# Patient Record
Sex: Male | Born: 1975 | Race: Black or African American | Hispanic: No | Marital: Single | Smoking: Never smoker
Health system: Southern US, Community
[De-identification: ages and names within clinical notes are randomized; demographics above are authoritative.]

## PROBLEM LIST (undated history)

## (undated) DIAGNOSIS — B20 Human immunodeficiency virus [HIV] disease: Principal | ICD-10-CM

## (undated) DIAGNOSIS — K209 Esophagitis, unspecified: Secondary | ICD-10-CM

## (undated) DIAGNOSIS — Z113 Encounter for screening for infections with a predominantly sexual mode of transmission: Secondary | ICD-10-CM

## (undated) DIAGNOSIS — R635 Abnormal weight gain: Secondary | ICD-10-CM

## (undated) DIAGNOSIS — J302 Other seasonal allergic rhinitis: Secondary | ICD-10-CM

## (undated) DIAGNOSIS — R739 Hyperglycemia, unspecified: Secondary | ICD-10-CM

## (undated) DIAGNOSIS — I10 Essential (primary) hypertension: Secondary | ICD-10-CM

## (undated) HISTORY — DX: Other seasonal allergic rhinitis: J30.2

## (undated) HISTORY — DX: Hyperglycemia, unspecified: R73.9

## (undated) HISTORY — DX: Human immunodeficiency virus (HIV) disease: B20

## (undated) HISTORY — DX: Essential (primary) hypertension: I10

## (undated) HISTORY — DX: Esophagitis, unspecified: K20.9

## (undated) HISTORY — DX: Abnormal weight gain: R63.5

## (undated) HISTORY — DX: Encounter for screening for infections with a predominantly sexual mode of transmission: Z11.3

---

## 1998-06-14 ENCOUNTER — Emergency Department (HOSPITAL_COMMUNITY): Admission: EM | Admit: 1998-06-14 | Discharge: 1998-06-14 | Payer: Self-pay | Admitting: Emergency Medicine

## 1998-07-05 ENCOUNTER — Encounter: Admission: RE | Admit: 1998-07-05 | Discharge: 1998-07-05 | Payer: Self-pay | Admitting: *Deleted

## 1999-09-15 ENCOUNTER — Emergency Department (HOSPITAL_COMMUNITY): Admission: EM | Admit: 1999-09-15 | Discharge: 1999-09-16 | Payer: Self-pay | Admitting: Emergency Medicine

## 1999-09-16 ENCOUNTER — Encounter: Payer: Self-pay | Admitting: Emergency Medicine

## 1999-12-20 ENCOUNTER — Encounter: Payer: Self-pay | Admitting: Emergency Medicine

## 1999-12-20 ENCOUNTER — Emergency Department (HOSPITAL_COMMUNITY): Admission: EM | Admit: 1999-12-20 | Discharge: 1999-12-20 | Payer: Self-pay | Admitting: Emergency Medicine

## 2000-06-05 ENCOUNTER — Emergency Department (HOSPITAL_COMMUNITY): Admission: EM | Admit: 2000-06-05 | Discharge: 2000-06-05 | Payer: Self-pay | Admitting: Emergency Medicine

## 2000-06-05 ENCOUNTER — Encounter: Payer: Self-pay | Admitting: Emergency Medicine

## 2002-05-17 ENCOUNTER — Emergency Department (HOSPITAL_COMMUNITY): Admission: EM | Admit: 2002-05-17 | Discharge: 2002-05-17 | Payer: Self-pay | Admitting: Emergency Medicine

## 2003-05-11 ENCOUNTER — Emergency Department (HOSPITAL_COMMUNITY): Admission: EM | Admit: 2003-05-11 | Discharge: 2003-05-11 | Payer: Self-pay | Admitting: Emergency Medicine

## 2003-06-13 ENCOUNTER — Encounter: Payer: Self-pay | Admitting: Emergency Medicine

## 2003-06-13 ENCOUNTER — Emergency Department (HOSPITAL_COMMUNITY): Admission: EM | Admit: 2003-06-13 | Discharge: 2003-06-13 | Payer: Self-pay | Admitting: Emergency Medicine

## 2004-03-06 ENCOUNTER — Emergency Department (HOSPITAL_COMMUNITY): Admission: EM | Admit: 2004-03-06 | Discharge: 2004-03-06 | Payer: Self-pay | Admitting: Emergency Medicine

## 2006-12-31 ENCOUNTER — Emergency Department (HOSPITAL_COMMUNITY): Admission: EM | Admit: 2006-12-31 | Discharge: 2006-12-31 | Payer: Self-pay | Admitting: Emergency Medicine

## 2007-03-23 ENCOUNTER — Emergency Department (HOSPITAL_COMMUNITY): Admission: EM | Admit: 2007-03-23 | Discharge: 2007-03-23 | Payer: Self-pay | Admitting: Emergency Medicine

## 2007-07-28 ENCOUNTER — Emergency Department (HOSPITAL_COMMUNITY): Admission: EM | Admit: 2007-07-28 | Discharge: 2007-07-28 | Payer: Self-pay | Admitting: Emergency Medicine

## 2008-03-15 ENCOUNTER — Emergency Department (HOSPITAL_COMMUNITY): Admission: EM | Admit: 2008-03-15 | Discharge: 2008-03-15 | Payer: Self-pay | Admitting: Emergency Medicine

## 2008-08-17 ENCOUNTER — Emergency Department (HOSPITAL_COMMUNITY): Admission: EM | Admit: 2008-08-17 | Discharge: 2008-08-17 | Payer: Self-pay | Admitting: Emergency Medicine

## 2010-01-12 ENCOUNTER — Observation Stay (HOSPITAL_COMMUNITY): Admission: EM | Admit: 2010-01-12 | Discharge: 2010-01-13 | Payer: Self-pay | Admitting: Emergency Medicine

## 2011-02-14 LAB — LIPID PANEL
Cholesterol: 185 mg/dL (ref 0–200)
HDL: 41 mg/dL (ref >39)
LDL Cholesterol: 128 mg/dL — ABNORMAL HIGH (ref 0–99)
VLDL: 16 mg/dL (ref 0–40)

## 2011-02-14 LAB — COMPREHENSIVE METABOLIC PANEL
ALT: 22 U/L (ref 0–53)
AST: 25 U/L (ref 0–37)
BUN: 8 mg/dL (ref 6–23)
Calcium: 9 mg/dL (ref 8.4–10.5)
GFR calc Af Amer: >60 mL/min (ref >60)
Sodium: 138 mEq/L (ref 135–145)

## 2011-02-14 LAB — CBC
HCT: 45.8 % (ref 39.0–52.0)
MCV: 92.7 fL (ref 78.0–100.0)
RBC: 4.94 MIL/uL (ref 4.22–5.81)

## 2011-02-14 LAB — DIFFERENTIAL
Basophils Absolute: 0 10*3/uL (ref 0.0–0.1)
Basophils Relative: 1 % (ref 0–1)
Eosinophils Absolute: 0.1 10*3/uL (ref 0.0–0.7)
Eosinophils Relative: 1 % (ref 0–5)
Lymphs Abs: 2.5 10*3/uL (ref 0.7–4.0)
Monocytes Absolute: 0.4 10*3/uL (ref 0.1–1.0)
Monocytes Relative: 8 % (ref 3–12)
Neutro Abs: 2.5 10*3/uL (ref 1.7–7.7)
Neutrophils Relative %: 46 % (ref 43–77)

## 2011-02-14 LAB — CK TOTAL AND CKMB (NOT AT ARMC)
CK, MB: 3 ng/mL (ref 0.3–4.0)
CK, MB: 3.2 ng/mL (ref 0.3–4.0)
Total CK: 382 U/L — ABNORMAL HIGH (ref 7–232)
Total CK: 408 U/L — ABNORMAL HIGH (ref 7–232)

## 2011-02-14 LAB — TROPONIN I
Troponin I: 0.01 ng/mL (ref 0.00–0.06)
Troponin I: <0.01 ng/mL (ref 0.00–0.06)

## 2011-08-19 LAB — POCT I-STAT, CHEM 8
BUN: 9
Creatinine, Ser: 1.3
Glucose, Bld: 75
HCT: 50
Hemoglobin: 17

## 2011-08-19 LAB — RAPID URINE DRUG SCREEN, HOSP PERFORMED
Barbiturates: NOT DETECTED
Benzodiazepines: NOT DETECTED

## 2011-08-19 LAB — ETHANOL: Alcohol, Ethyl (B): <5

## 2011-08-25 LAB — BASIC METABOLIC PANEL
CO2: 30
GFR calc Af Amer: >60
Glucose, Bld: 90

## 2011-08-25 LAB — DIFFERENTIAL
Basophils Absolute: 0
Basophils Relative: 0
Eosinophils Absolute: 0.4
Eosinophils Relative: 8 — ABNORMAL HIGH
Lymphocytes Relative: 36
Monocytes Absolute: 0.6
Monocytes Relative: 14 — ABNORMAL HIGH
Neutro Abs: 1.8
Neutrophils Relative %: 42 — ABNORMAL LOW

## 2011-08-25 LAB — CBC
HCT: 45.7
MCHC: 34
RBC: 4.91
WBC: 4.4

## 2011-08-25 LAB — URINALYSIS, ROUTINE W REFLEX MICROSCOPIC
Glucose, UA: NEGATIVE
Hgb urine dipstick: NEGATIVE
Ketones, ur: NEGATIVE
Protein, ur: NEGATIVE
Urobilinogen, UA: 1
pH: 7

## 2011-08-25 LAB — PHENYTOIN LEVEL, TOTAL: Phenytoin Lvl: <2.5 — ABNORMAL LOW

## 2015-05-03 ENCOUNTER — Encounter (HOSPITAL_COMMUNITY): Payer: Self-pay | Admitting: *Deleted

## 2015-05-03 ENCOUNTER — Emergency Department (HOSPITAL_COMMUNITY)
Admission: EM | Admit: 2015-05-03 | Discharge: 2015-05-03 | Disposition: A | Discharge status: Home / Self Care | Payer: Self-pay | Attending: Emergency Medicine | Admitting: Emergency Medicine

## 2015-05-03 DIAGNOSIS — L509 Urticaria, unspecified: Secondary | ICD-10-CM | POA: Insufficient documentation

## 2015-05-03 DIAGNOSIS — Z79899 Other long term (current) drug therapy: Secondary | ICD-10-CM | POA: Insufficient documentation

## 2015-05-03 MED ORDER — FAMOTIDINE 20 MG PO TABS: 20.0000 mg | ORAL_TABLET | Freq: Two times a day (BID) | ORAL | Status: DC

## 2015-05-03 MED ORDER — FAMOTIDINE IN NACL 20-0.9 MG/50ML-% IV SOLN
20.0000 mg | Freq: Once | INTRAVENOUS | Status: AC
  Administered 2015-05-03: 20 mg via INTRAVENOUS
  Filled 2015-05-03: qty 50

## 2015-05-03 MED ORDER — METHYLPREDNISOLONE SODIUM SUCC 125 MG IJ SOLR
125.0000 mg | Freq: Once | INTRAMUSCULAR | Status: AC
  Administered 2015-05-03: 125 mg via INTRAVENOUS
  Filled 2015-05-03: qty 2

## 2015-05-03 MED ORDER — DIPHENHYDRAMINE HCL 25 MG PO TABS: 25.0000 mg | ORAL_TABLET | Freq: Four times a day (QID) | ORAL | Status: DC

## 2015-05-03 MED ORDER — PREDNISONE 10 MG PO TABS: ORAL_TABLET | ORAL | Status: DC

## 2015-05-03 MED ORDER — DIPHENHYDRAMINE HCL 50 MG/ML IJ SOLN
12.5000 mg | Freq: Once | INTRAMUSCULAR | Status: AC
  Administered 2015-05-03: 12.5 mg via INTRAVENOUS
  Filled 2015-05-03: qty 1

## 2015-05-03 NOTE — ED Notes (Signed)
Pt ate shrimp on Tuesday, for which he has had nausea and vomiting from before.  There has been recent changes in washing detergent.

## 2015-05-03 NOTE — Discharge Instructions (Signed)
Hives Hives are itchy, red, swollen areas of the skin. They can vary in size and location on your body. Hives can come and go for hours or several days (acute hives) or for several weeks (chronic hives). Hives do not spread from person to person (noncontagious). They may get worse with scratching, exercise, and emotional stress. CAUSES   Allergic reaction to food, additives, or drugs.  Infections, including the common cold.  Illness, such as vasculitis, lupus, or thyroid disease.  Exposure to sunlight, heat, or cold.  Exercise.  Stress.  Contact with chemicals. SYMPTOMS   Red or white swollen patches on the skin. The patches may change size, shape, and location quickly and repeatedly.  Itching.  Swelling of the hands, feet, and face. This may occur if hives develop deeper in the skin. DIAGNOSIS  Your caregiver can usually tell what is wrong by performing a physical exam. Skin or blood tests may also be done to determine the cause of your hives. In some cases, the cause cannot be determined. TREATMENT  Mild cases usually get better with medicines such as antihistamines. Severe cases may require an emergency epinephrine injection. If the cause of your hives is known, treatment includes avoiding that trigger.  HOME CARE INSTRUCTIONS   Avoid causes that trigger your hives.  Take antihistamines as directed by your caregiver to reduce the severity of your hives. Non-sedating or low-sedating antihistamines are usually recommended. Do not drive while taking an antihistamine.  Take any other medicines prescribed for itching as directed by your caregiver.  Wear loose-fitting clothing.  Keep all follow-up appointments as directed by your caregiver. SEEK MEDICAL CARE IF:   You have persistent or severe itching that is not relieved with medicine.  You have painful or swollen joints. SEEK IMMEDIATE MEDICAL CARE IF:   You have a fever.  Your tongue or lips are swollen.  You have  trouble breathing or swallowing.  You feel tightness in the throat or chest.  You have abdominal pain. These problems may be the first sign of a life-threatening allergic reaction. Call your local emergency services (911 in [place] MAKE SURE YOU:   Understand these instructions.  Will watch your condition.  Will get help right away if you are not doing well or get worse. Document Released: 11/10/2005 Document Revised: 11/15/2013 Document Reviewed: 02/03/2012 ExitCare Patient Information 2015 [employer]. This information is not intended to replace advice given to you by your health care provider. Make sure you discuss any questions you have with your health care provider.  

## 2015-05-03 NOTE — ED Provider Notes (Signed)
CSN: [id number]     Arrival date & time 05/03/15  0114 History   First MD Initiated Contact with Patient 05/03/15 0146     Chief Complaint  Patient presents with  . Rash     (Consider location/radiation/quality/duration/timing/severity/associated sxs/prior Treatment) Patient is a 39 y.o. male presenting with rash. The history is provided by the patient. No language interpreter was used.  Rash Location:  Full body Quality: itchiness and redness   Onset quality:  Gradual Associated symptoms: no abdominal pain, no fever and no shortness of breath   Associated symptoms comment:  Generalized rash since yesterday afternoon that itches. He ate shrimp 48 hours ago and reports similar reaction after eating shrimp in the past. No SOB, tongue or lip swelling and no difficulty swallowing.    History reviewed. No pertinent past medical history. History reviewed. No pertinent past surgical history. No family history on file. History  Substance Use Topics  . Smoking status: Never Smoker   . Smokeless tobacco: Not on file  . Alcohol Use: Yes     Comment: occ    Review of Systems  Constitutional: Negative for fever and chills.  HENT: Negative.  Negative for trouble swallowing.   Respiratory: Negative.  Negative for shortness of breath.   Cardiovascular: Negative.   Gastrointestinal: Negative.  Negative for abdominal pain.  Musculoskeletal: Negative.   Skin: Positive for rash.       See HPI.  Neurological: Negative.       Allergies  Shrimp  Home Medications   Prior to Admission medications   Medication Sig Start Date End Date Taking? Authorizing Provider  BLACK CURRANT SEED OIL PO Take 1 drop by mouth daily.   Yes Historical Provider, MD  cholecalciferol (VITAMIN D) 1000 UNITS tablet Take 1,000 Units by mouth daily.   Yes Historical Provider, MD  diphenhydrAMINE (BENADRYL) 12.5 MG/5ML elixir Take 37.5 mg by mouth 4 (four) times daily as needed for allergies.   Yes Historical  Provider, MD  Flaxseed, Linseed, (FLAXSEED OIL) 1000 MG CAPS Take 1 capsule by mouth daily.   Yes Historical Provider, MD  hydrocortisone cream 1 % Apply 1 application topically 2 (two) times daily as needed for itching.   Yes Historical Provider, MD  ibuprofen (ADVIL,MOTRIN) 200 MG tablet Take 400 mg by mouth every 6 (six) hours as needed for moderate pain.   Yes Historical Provider, MD  Multiple Vitamin (MULTIVITAMIN WITH MINERALS) TABS tablet Take 1 tablet by mouth daily.   Yes Historical Provider, MD  Oil of Oregano 1500 MG CAPS Take 1 capsule by mouth daily.   Yes Historical Provider, MD  omega-3 acid ethyl esters (LOVAZA) 1 G capsule Take 1 g by mouth 2 (two) times daily.   Yes Historical Provider, MD  OVER THE COUNTER MEDICATION Take 2 tablets by mouth daily. Schisandra (supplement)   Yes Historical Provider, MD  OVER THE COUNTER MEDICATION Take 1 tablet by mouth daily. Tumeric   Yes Historical Provider, MD  vitamin E 1000 UNIT capsule Take 1,000 Units by mouth daily.   Yes Historical Provider, MD   BP 134/110 mmHg  Pulse 79  Temp(Src) 99.7 F (37.6 C) (Rectal)  Resp 20  SpO2 100% Physical Exam  Constitutional: He is oriented to person, place, and time. He appears well-developed and well-nourished.  HENT:  Head: Normocephalic.  Mouth/Throat: Oropharynx is clear and moist.  Neck: Normal range of motion. Neck supple.  Cardiovascular: Normal rate and regular rhythm.   Pulmonary/Chest: Effort normal and breath  sounds normal. He has no wheezes. He has no rales.  Abdominal: Soft. Bowel sounds are normal. There is no tenderness. There is no rebound and no guarding.  Musculoskeletal: Normal range of motion.  Neurological: He is alert and oriented to person, place, and time.  Skin: Skin is warm and dry. Rash noted.  Red, raised generalized rash c/w hives.   Psychiatric: He has a normal mood and affect.    ED Course  Procedures (including critical care time) Labs Review Labs Reviewed  - No data to display  Imaging Review No results found.   EKG Interpretation None      MDM   Final diagnoses:  None    1. Hives  Symptoms improved with medications in ED. Continues to deny SOB, difficulty swelling. Feel he is stable for discharge home.     Elpidio Anis, PA-C 05/03/15 0444  Elwin Mocha, MD 05/03/15 [phone]

## 2015-05-03 NOTE — ED Notes (Signed)
Pt states that he broke out in a rash this evening around 5pm; pt c/o itching and feel hot and cold; pt with red raised rash; pt denies difficulty breathing

## 2015-10-04 ENCOUNTER — Emergency Department (HOSPITAL_COMMUNITY): Payer: Self-pay

## 2015-10-04 ENCOUNTER — Encounter (HOSPITAL_COMMUNITY): Payer: Self-pay | Admitting: Family Medicine

## 2015-10-04 ENCOUNTER — Telehealth: Payer: Self-pay | Admitting: Infectious Disease

## 2015-10-04 ENCOUNTER — Emergency Department (HOSPITAL_COMMUNITY)
Admission: EM | Admit: 2015-10-04 | Discharge: 2015-10-04 | Disposition: A | Discharge status: Home / Self Care | Payer: Self-pay | Attending: Emergency Medicine | Admitting: Emergency Medicine

## 2015-10-04 DIAGNOSIS — F419 Anxiety disorder, unspecified: Secondary | ICD-10-CM | POA: Insufficient documentation

## 2015-10-04 DIAGNOSIS — Z79899 Other long term (current) drug therapy: Secondary | ICD-10-CM | POA: Insufficient documentation

## 2015-10-04 DIAGNOSIS — Z87891 Personal history of nicotine dependence: Secondary | ICD-10-CM | POA: Insufficient documentation

## 2015-10-04 DIAGNOSIS — B20 Human immunodeficiency virus [HIV] disease: Secondary | ICD-10-CM | POA: Insufficient documentation

## 2015-10-04 DIAGNOSIS — Z21 Asymptomatic human immunodeficiency virus [HIV] infection status: Secondary | ICD-10-CM

## 2015-10-04 DIAGNOSIS — K219 Gastro-esophageal reflux disease without esophagitis: Secondary | ICD-10-CM | POA: Insufficient documentation

## 2015-10-04 DIAGNOSIS — IMO0001 Reserved for inherently not codable concepts without codable children: Secondary | ICD-10-CM

## 2015-10-04 LAB — DIFFERENTIAL
Basophils Absolute: 0 10*3/uL (ref 0.0–0.1)
Basophils Relative: 1 %
EOS ABS: 0 10*3/uL (ref 0.0–0.7)
EOS PCT: 1 %
Lymphocytes Relative: 26 %
Lymphs Abs: 0.5 10*3/uL — ABNORMAL LOW (ref 0.7–4.0)
Monocytes Absolute: 0.3 10*3/uL (ref 0.1–1.0)
Monocytes Relative: 15 %
Neutro Abs: 1.2 10*3/uL — ABNORMAL LOW (ref 1.7–7.7)
Neutrophils Relative %: 57 %

## 2015-10-04 LAB — I-STAT TROPONIN, ED: TROPONIN I, POC: 0 ng/mL (ref 0.00–0.08)

## 2015-10-04 LAB — CBC
HCT: 38.5 % — ABNORMAL LOW (ref 39.0–52.0)
HEMOGLOBIN: 13.2 g/dL (ref 13.0–17.0)
MCH: 31.4 pg (ref 26.0–34.0)
MCHC: 34.3 g/dL (ref 30.0–36.0)
MCV: 91.7 fL (ref 78.0–100.0)
PLATELETS: 173 10*3/uL (ref 150–400)
RBC: 4.2 MIL/uL — AB (ref 4.22–5.81)
RDW: 13.2 % (ref 11.5–15.5)
WBC: 2.2 10*3/uL — AB (ref 4.0–10.5)

## 2015-10-04 LAB — COMPREHENSIVE METABOLIC PANEL
ALT: 26 U/L (ref 17–63)
ANION GAP: 8 (ref 5–15)
AST: 34 U/L (ref 15–41)
Albumin: 3.9 g/dL (ref 3.5–5.0)
Alkaline Phosphatase: 69 U/L (ref 38–126)
BILIRUBIN TOTAL: 0.6 mg/dL (ref 0.3–1.2)
BUN: 12 mg/dL (ref 6–20)
CO2: 23 mmol/L (ref 22–32)
Calcium: 8.7 mg/dL — ABNORMAL LOW (ref 8.9–10.3)
Chloride: 110 mmol/L (ref 101–111)
Creatinine, Ser: 1.12 mg/dL (ref 0.61–1.24)
GFR calc Af Amer: >60 mL/min (ref >60)
Glucose, Bld: 95 mg/dL (ref 65–99)
POTASSIUM: 3.6 mmol/L (ref 3.5–5.1)
Sodium: 141 mmol/L (ref 135–145)
TOTAL PROTEIN: 8.1 g/dL (ref 6.5–8.1)

## 2015-10-04 LAB — RAPID HIV SCREEN (HIV 1/2 AB+AG)
HIV 1/2 Antibodies: REACTIVE — AB
HIV-1 P24 ANTIGEN - HIV24: NONREACTIVE

## 2015-10-04 MED ORDER — GI COCKTAIL ~~LOC~~
30.0000 mL | Freq: Once | ORAL | Status: AC
  Administered 2015-10-04: 30 mL via ORAL
  Filled 2015-10-04: qty 30

## 2015-10-04 MED ORDER — FAMOTIDINE 20 MG PO TABS: 20.0000 mg | ORAL_TABLET | Freq: Two times a day (BID) | ORAL | Status: DC | PRN

## 2015-10-04 MED ORDER — ESOMEPRAZOLE MAGNESIUM 40 MG PO CPDR: 40.0000 mg | DELAYED_RELEASE_CAPSULE | Freq: Every day | ORAL | Status: DC

## 2015-10-04 MED ORDER — PANTOPRAZOLE SODIUM 40 MG IV SOLR
40.0000 mg | Freq: Once | INTRAVENOUS | Status: AC
  Administered 2015-10-04: 40 mg via INTRAVENOUS
  Filled 2015-10-04: qty 40

## 2015-10-04 MED ORDER — IOHEXOL 350 MG/ML SOLN
100.0000 mL | Freq: Once | INTRAVENOUS | Status: AC | PRN
  Administered 2015-10-04: 100 mL via INTRAVENOUS

## 2015-10-04 MED ORDER — FAMOTIDINE 20 MG PO TABS: 20.0000 mg | ORAL_TABLET | Freq: Two times a day (BID) | ORAL | Status: DC

## 2015-10-04 MED ORDER — SODIUM CHLORIDE 0.9 % IV BOLUS (SEPSIS)
1000.0000 mL | Freq: Once | INTRAVENOUS | Status: AC
  Administered 2015-10-04: 1000 mL via INTRAVENOUS

## 2015-10-04 NOTE — ED Provider Notes (Signed)
CSN: [id number]     Arrival date & time 10/04/15  8295 History   First MD Initiated Contact with Patient 10/04/15 0813     Chief Complaint  Patient presents with  . Chest Pain     (Consider location/radiation/quality/duration/timing/severity/associated sxs/prior Treatment) The history is provided by the patient.  ASAR EVILSIZER is a 39 y.o. male who presenting with right-sided chest pain. They said chest pain about 3 weeks ago that lasted several days. He noticed that he is burping a lot and is worse when he eats. Denies any epigastric pain or vomiting. Pain resolved in several days and came back 4 days ago. Patient states that the pain is constant and worse when he eats spicy food. Has some subjective shortness of breath but denies any recent travel or history of DVT or PE. Patient has no history of CAD or stents. Patient is not a smoker and drinks alcohol occasionally. Patient was admitted in 2011 for chest pain and had negative serial troponins.    History reviewed. No pertinent past medical history. History reviewed. No pertinent past surgical history. History reviewed. No pertinent family history. Social History  Substance Use Topics  . Smoking status: Former Games developer  . Smokeless tobacco: None  . Alcohol Use: Yes     Comment: Once a week.     Review of Systems  Cardiovascular: Positive for chest pain.  All other systems reviewed and are negative.     Allergies  Shrimp  Home Medications   Prior to Admission medications   Medication Sig Start Date End Date Taking? Authorizing Provider  BLACK CURRANT SEED OIL PO Take 1 drop by mouth daily.    Historical Provider, MD  cholecalciferol (VITAMIN D) 1000 UNITS tablet Take 1,000 Units by mouth daily.    Historical Provider, MD  diphenhydrAMINE (BENADRYL) 25 MG tablet Take 1 tablet (25 mg total) by mouth every 6 (six) hours. Take for 3 days then as needed for recurrent rash 05/03/15   Elpidio Anis, PA-C  famotidine (PEPCID) 20  MG tablet Take 1 tablet (20 mg total) by mouth 2 (two) times daily. Take for 3 days then as needed for recurrent rash 05/03/15   Elpidio Anis, PA-C  Flaxseed, Linseed, (FLAXSEED OIL) 1000 MG CAPS Take 1 capsule by mouth daily.    Historical Provider, MD  hydrocortisone cream 1 % Apply 1 application topically 2 (two) times daily as needed for itching.    Historical Provider, MD  ibuprofen (ADVIL,MOTRIN) 200 MG tablet Take 400 mg by mouth every 6 (six) hours as needed for moderate pain.    Historical Provider, MD  Multiple Vitamin (MULTIVITAMIN WITH MINERALS) TABS tablet Take 1 tablet by mouth daily.    Historical Provider, MD  Oil of Oregano 1500 MG CAPS Take 1 capsule by mouth daily.    Historical Provider, MD  omega-3 acid ethyl esters (LOVAZA) 1 G capsule Take 1 g by mouth 2 (two) times daily.    Historical Provider, MD  OVER THE COUNTER MEDICATION Take 2 tablets by mouth daily. Schisandra (supplement)    Historical Provider, MD  OVER THE COUNTER MEDICATION Take 1 tablet by mouth daily. Tumeric    Historical Provider, MD  predniSONE (DELTASONE) 10 MG tablet Take 6 tablets on day 1  Take 5 tablets on day 2 Take 4 tablets on day 3 Take 3 tablets on day 4 Take 2 tablets on day 5 Take 1 tablet on day 6 05/03/15   Elpidio Anis, PA-C  vitamin E  1000 UNIT capsule Take 1,000 Units by mouth daily.    Historical Provider, MD   BP 127/78 mmHg  Pulse 67  Temp(Src) 98.2 F (36.8 C) (Oral)  Resp 14  SpO2 100% Physical Exam  Constitutional: He is oriented to person, place, and time. He appears well-developed and well-nourished.  Anxious   HENT:  Head: Normocephalic.  Mouth/Throat: Oropharynx is clear and moist.  Eyes: Conjunctivae are normal. Pupils are equal, round, and reactive to light.  Neck: Normal range of motion. Neck supple.  Cardiovascular: Normal rate, regular rhythm and normal heart sounds.   Pulmonary/Chest: Effort normal and breath sounds normal. No respiratory distress. He has no  wheezes. He has no rales.  Abdominal: Soft. Bowel sounds are normal. He exhibits no distension. There is no tenderness. There is no rebound.  Musculoskeletal: Normal range of motion. He exhibits no edema.  Neurological: He is alert and oriented to person, place, and time. No cranial nerve deficit. Coordination normal.  Skin: Skin is warm and dry.  Psychiatric: He has a normal mood and affect. His behavior is normal. Judgment and thought content normal.  Nursing note and vitals reviewed.   ED Course  Procedures (including critical care time) Labs Review Labs Reviewed  CBC - Abnormal; Notable for the following:    WBC 2.2 (*)    RBC 4.20 (*)    HCT 38.5 (*)    All other components within normal limits  COMPREHENSIVE METABOLIC PANEL - Abnormal; Notable for the following:    Calcium 8.7 (*)    All other components within normal limits  DIFFERENTIAL - Abnormal; Notable for the following:    Neutro Abs 1.2 (*)    Lymphs Abs 0.5 (*)    All other components within normal limits  RAPID HIV SCREEN (HIV 1/2 AB+AG)  HIV ANTIBODY (ROUTINE TESTING)  I-STAT TROPOININ, ED    Imaging Review Dg Chest 2 View  10/04/2015  CLINICAL DATA:  Chest pain . EXAM: CHEST  2 VIEW COMPARISON:  01/12/2010. FINDINGS: Mediastinum hilar structures normal. Lungs are clear. Heart size stable. No pleural effusion or pneumothorax. IMPRESSION: No acute cardiopulmonary disease. Electronically Signed   By: Maisie Fus  Register   On: 10/04/2015 09:03   I have personally reviewed and evaluated these images and lab results as part of my medical decision-making.   EKG Interpretation   Date/Time:  Thursday October 04 2015 08:09:11 EST Ventricular Rate:  79 PR Interval:  141 QRS Duration: 84 QT Interval:  376 QTC Calculation: 431 R Axis:   67 Text Interpretation:  Sinus rhythm Probable left atrial enlargement  Probable anteroseptal infarct, old No significant change since last  tracing J point elevation Confirmed by  YAO  MD, DAVID ([id number]) on  10/04/2015 8:14:06 AM      MDM   Final diagnoses:  None    ZAHKI HOOGENDOORN is a 39 y.o. male here with chest pain, shortness of breath that is worse with food. Symptoms for several days so trop x 1 sufficient. Will get labs, CXR. PERC neg so will not need D-dimer.   9 am  WBC 2.2. Differential showed low lymphocyte count 500. Asked about sexual history when friend is not present. Patient is sexually active with male partner, last sex was about a month ago and doesn't use protection. Will check HIV.   10: 15 AM Rapid HIV positive. Called Dr. Daiva Eves from ID. He states that patient will need f/u with him tomorrow. Given no hypoxia and no fever  unlikely PCP pneumonia. His office will see patient tomorrow and he recommend CD 4 count and viral load in ED. He is borderline tachy now so will get CT angio. His contact number is updated in the system. Also can call Mr. Bettina GaviaKevin Murphy- [phone]   12:52 PM CT angio showed no PE but has esophagitis vs reflux. No oral thrush on exam. Consulted Dr. Dulce Sellarutlaw from GI. Recommend treatment for reflux for now and not start on diflucan. Will need to get HIV meds first and will see patient in the office.      Richardean Canalavid H Yao, MD 10/04/15 1254

## 2015-10-04 NOTE — Telephone Encounter (Signed)
Tammy is not here today or tomorrow. No one to do [facility] tomorrow. Denny Peonrin will be back on Monday. And no available MD visits. Friday is travel clinic only. Walter MolaJacqueline Lesha Jager

## 2015-10-04 NOTE — Telephone Encounter (Signed)
Lets get him in on Monday when we can do [facility] can overbook with me in the am. I

## 2015-10-04 NOTE — ED Notes (Signed)
Patient transported to X-ray 

## 2015-10-04 NOTE — Telephone Encounter (Signed)
Tammy any availablity in MD spots tomorrow?  Also do we have folks to do [employer] [facility] Like [place] and [place]?  I [facility] Called about adn in the "inbasket" for newly diagnosed sound pretty sick an needing meds ASAP  If no one has slots tomorrow I can see him but I want to make sure we have personnell tomorrow am to handle [facility] and [facility] to get him on ARVs ASAP

## 2015-10-04 NOTE — ED Notes (Addendum)
MD at bedside. 

## 2015-10-04 NOTE — ED Notes (Signed)
Patient is complaining of right sided chest pain that started Monday morning when getting up for the morning. Pt reports this pain occurred three weeks ago but lasting a few days. Pt complains of difficulty breathing. Pt appears in no acute respiratory distress. Pain described as sharp, aching that does not radiate.

## 2015-10-04 NOTE — Discharge Instructions (Signed)
Take nexium daily.   Take pepcid twice daily as needed.   You need to call Dr. Clinton GallantVan Dam's office to get follow up soon.   Call Dr. Dulce Sellarutlaw to get GI follow up and possible endoscopy.   Return to ER if you have trouble breathing, fever, unable to swallow food, dehydration.

## 2015-10-04 NOTE — Telephone Encounter (Signed)
Ok, he will be added to your schedule. Walter MolaJacqueline [name]

## 2015-10-05 LAB — HIV-1/2 AB - DIFFERENTIATION
HIV 1 Ab: POSITIVE — AB
HIV 2 Ab: NEGATIVE

## 2015-10-05 LAB — T-HELPER CELLS (CD4) COUNT (NOT AT ARMC)
CD4 % Helper T Cell: 6 % — ABNORMAL LOW (ref 33–55)
CD4 T Cell Abs: 30 /uL — ABNORMAL LOW (ref 400–2700)

## 2015-10-05 NOTE — Telephone Encounter (Signed)
Very good thanks Jackie 

## 2015-10-06 LAB — HIV 1/2 AB DIFFERENTIATION
HIV 1 Ab: POSITIVE — AB
HIV 2 Ab: NEGATIVE

## 2015-10-06 LAB — HIV ANTIBODY (ROUTINE TESTING W REFLEX)

## 2015-10-08 ENCOUNTER — Other Ambulatory Visit: Payer: Self-pay | Admitting: *Deleted

## 2015-10-08 ENCOUNTER — Encounter: Payer: Self-pay | Admitting: Infectious Disease

## 2015-10-08 ENCOUNTER — Encounter: Payer: Self-pay | Admitting: *Deleted

## 2015-10-08 ENCOUNTER — Ambulatory Visit (INDEPENDENT_AMBULATORY_CARE_PROVIDER_SITE_OTHER): Payer: Self-pay | Admitting: Infectious Disease

## 2015-10-08 VITALS — BP 150/90 | HR 66 | Temp 98.0°F | Ht 69.0 in | Wt 181.5 lb

## 2015-10-08 DIAGNOSIS — B20 Human immunodeficiency virus [HIV] disease: Secondary | ICD-10-CM

## 2015-10-08 DIAGNOSIS — Z23 Encounter for immunization: Secondary | ICD-10-CM

## 2015-10-08 DIAGNOSIS — Z79899 Other long term (current) drug therapy: Secondary | ICD-10-CM

## 2015-10-08 DIAGNOSIS — K209 Esophagitis, unspecified without bleeding: Secondary | ICD-10-CM

## 2015-10-08 DIAGNOSIS — Z113 Encounter for screening for infections with a predominantly sexual mode of transmission: Secondary | ICD-10-CM

## 2015-10-08 DIAGNOSIS — B370 Candidal stomatitis: Secondary | ICD-10-CM

## 2015-10-08 HISTORY — DX: Human immunodeficiency virus (HIV) disease: B20

## 2015-10-08 HISTORY — DX: Esophagitis, unspecified without bleeding: K20.90

## 2015-10-08 LAB — COMPLETE METABOLIC PANEL WITH GFR
ALT: 44 U/L (ref 9–46)
AST: 34 U/L (ref 10–40)
Albumin: 3.7 g/dL (ref 3.6–5.1)
Alkaline Phosphatase: 66 U/L (ref 40–115)
BUN: 7 mg/dL (ref 7–25)
CO2: 25 mmol/L (ref 20–31)
Calcium: 8.8 mg/dL (ref 8.6–10.3)
Chloride: 107 mmol/L (ref 98–110)
Creat: 0.87 mg/dL (ref 0.60–1.35)
GFR, Est African American: >89 mL/min (ref >=60)
GFR, Est Non African American: >89 mL/min (ref >=60)
Glucose, Bld: 94 mg/dL (ref 65–99)
Potassium: 3.7 mmol/L (ref 3.5–5.3)
Sodium: 143 mmol/L (ref 135–146)
Total Bilirubin: 0.6 mg/dL (ref 0.2–1.2)
Total Protein: 7.2 g/dL (ref 6.1–8.1)

## 2015-10-08 LAB — CBC WITH DIFFERENTIAL/PLATELET
BASOS PCT: 0 % (ref 0–1)
Basophils Absolute: 0 10*3/uL (ref 0.0–0.1)
EOS ABS: 0 10*3/uL (ref 0.0–0.7)
EOS PCT: 0 % (ref 0–5)
HEMATOCRIT: 38 % — AB (ref 39.0–52.0)
HEMOGLOBIN: 12.5 g/dL — AB (ref 13.0–17.0)
Lymphocytes Relative: 12 % (ref 12–46)
Lymphs Abs: 0.3 10*3/uL — ABNORMAL LOW (ref 0.7–4.0)
MCH: 30.6 pg (ref 26.0–34.0)
MCHC: 32.9 g/dL (ref 30.0–36.0)
MCV: 92.9 fL (ref 78.0–100.0)
MONO ABS: 0.3 10*3/uL (ref 0.1–1.0)
MONOS PCT: 12 % (ref 3–12)
MPV: 9.8 fL (ref 8.6–12.4)
NEUTROS PCT: 76 % (ref 43–77)
Neutro Abs: 1.9 10*3/uL (ref 1.7–7.7)
Platelets: 200 10*3/uL (ref 150–400)
RBC: 4.09 MIL/uL — ABNORMAL LOW (ref 4.22–5.81)
RDW: 13.7 % (ref 11.5–15.5)
WBC: 2.5 10*3/uL — ABNORMAL LOW (ref 4.0–10.5)

## 2015-10-08 LAB — LIPID PANEL
Cholesterol: 144 mg/dL (ref 125–200)
HDL: 35 mg/dL — ABNORMAL LOW (ref >=40)
LDL Cholesterol: 87 mg/dL (ref <130)
Total CHOL/HDL Ratio: 4.1 Ratio (ref <=5.0)
Triglycerides: 110 mg/dL (ref <150)
VLDL: 22 mg/dL (ref <30)

## 2015-10-08 MED ORDER — DOLUTEGRAVIR SODIUM 50 MG PO TABS: 50.0000 mg | ORAL_TABLET | Freq: Every day | ORAL | Status: DC

## 2015-10-08 MED ORDER — EMTRICITABINE-TENOFOVIR AF 200-25 MG PO TABS: 1.0000 | ORAL_TABLET | Freq: Every day | ORAL | Status: DC

## 2015-10-08 MED ORDER — SULFAMETHOXAZOLE-TRIMETHOPRIM 800-160 MG PO TABS: 1.0000 | ORAL_TABLET | Freq: Two times a day (BID) | ORAL | Status: DC

## 2015-10-08 MED ORDER — AZITHROMYCIN 600 MG PO TABS: 1200.0000 mg | ORAL_TABLET | ORAL | Status: DC

## 2015-10-08 NOTE — Addendum Note (Signed)
Addended by: Mariea ClontsGREEN, Semisi Biela D on: 10/08/2015 04:03 PM   Modules accepted: Orders

## 2015-10-08 NOTE — Progress Notes (Signed)
Patient ID: CASY BRUNETTO, male   DOB: [birth year], 39 y.o.   MRN: [id number]   HPI: KEIRON IODICE is a 40 y.o. male who presents with his roommate for initial HIV visit.  Allergies: Allergies  Allergen Reactions  . Shrimp [Shellfish Allergy] Nausea And Vomiting    Vitals: Temp: 98 F (36.7 C) (11/14 1017) Temp Source: Oral (11/14 1017) BP: 150/90 mmHg (11/14 1017) Pulse Rate: 66 (11/14 1017)  Past Medical History: Past Medical History  Diagnosis Date  . AIDS (HCC) 10/08/2015  . Acute esophagitis 10/08/2015    Social History: Social History   Social History  . Marital Status: Single    Spouse Name: N/A  . Number of Children: N/A  . Years of Education: N/A   Social History Main Topics  . Smoking status: Never Smoker   . Smokeless tobacco: Not on file  . Alcohol Use: 0.0 oz/week    0 Standard drinks or equivalent per week     Comment: Once a week.   . Drug Use: No  . Sexual Activity: Not on file   Other Topics Concern  . Not on file   Social History Narrative    Previous Regimen: None  Current Regimen: None  Labs: CD4 T CELL ABS (/uL)  Date Value  10/04/2015 30*    CrCl: Estimated Creatinine Clearance: 89.4 mL/min (by C-G formula based on Cr of 1.12).  Lipids:    Component Value Date/Time   CHOL  01/12/2010 0305    185        ATP III CLASSIFICATION:  <200     mg/dL   Desirable  119-147  mg/dL   Borderline High  >=829    mg/dL   High          TRIG 80 01/12/2010 0305   HDL 41 01/12/2010 0305   CHOLHDL 4.5 01/12/2010 0305   VLDL 16 01/12/2010 0305   LDLCALC * 01/12/2010 0305    128        Total Cholesterol/HDL:CHD Risk Coronary Heart Disease Risk Table                     Men   Women  1/2 Average Risk   3.4   3.3  Average Risk       5.0   4.4  2 X Average Risk   9.6   7.1  3 X Average Risk  23.4   11.0        Use the calculated Patient Ratio above and the CHD Risk Table to determine the patient's CHD Risk.        ATP III  CLASSIFICATION (LDL):  <100     mg/dL   Optimal  562-130  mg/dL   Near or Above                    Optimal  130-159  mg/dL   Borderline  865-784  mg/dL   High  >696     mg/dL   Very High    Assessment: Mr. Grime is a 39 year old male who presents today for his initial HIV visit. He was recently seen in the ED on 11/10 for chest pain while eating. During that visit he was noted to have a positive HIV test and referred to the [facility]. The patient states he has been taking multiple vitamins and herbal medications at home. Today, he will be started on Tivicay, Descovy, Bactrim, and Azithromycin. He will  [facility] until his AD[facility] is completed.  I educated Mr. Eliot FordBriggs on stopping his vitamins and herbal supplements as we begin treating his HIV. He is agreeable to this plan and was advised to not start any new medications without contacting the clinic first. We discussed the importance of taking his medications every day and at the same time. He plans to take his Tivicay and Descovy every morning. Prescriptions were sent to his local pharmacy for Bactrim and Azithromycin. Discount cards were provided to the patient.   Recommendations: - Tivicay and Descovy daily in the morning - Bactrim and Azithromycin for OI prophylaxis - Do not start any new medications or OTCs without first contacting the clinic - Patient will follow up in clinic ~ 4 weeks.  Ancil Boozeraylor P Raynesha Tiedt, PharmD PGY-1 Pharmacy Resident Pager: [phone]  10/08/2015, 11:10 AM

## 2015-10-08 NOTE — Progress Notes (Signed)
Chief complaint: difficultly swallowing, and here to establish care for his newly diagnosed HIV infection Subjective:    Patient ID: Walter [name], male    DOB: 07-02-1976, 39 y.o.   MRN: [id number]  HPI  39 year old [place] American man who had last tested for HIV - over 10 years ago. He had presented to the ED last week with significant dyspnea, SOB, right sided chest pain, burping. CXR without pathology. Labs significant for leukopenia. Rapid HIV and 4th generation HIV test were both positive for HIV and CD4 was only 30. His HIV VL and genotype are pending. He was worked into clinic urgently given severity of his disease. He was rx PPI for his dysphagia which has helped though I worry about him having underlying candidal esophagitis.   Past Medical History  Diagnosis Date  . AIDS ([place]) 10/08/2015  . Acute esophagitis 10/08/2015    History reviewed. No pertinent past surgical history.  Family History  Problem Relation Age of Onset  . Hypertension Mother       Social History   Social History  . Marital Status: Single    Spouse Name: N/A  . Number of Children: N/A  . Years of Education: N/A   Social History Main Topics  . Smoking status: Never Smoker   . Smokeless tobacco: None  . Alcohol Use: 0.0 oz/week    0 Standard drinks or equivalent per week     Comment: Once a week.   . Drug Use: No  . Sexual Activity: Not Currently   Other Topics Concern  . None   Social History Narrative    Allergies  Allergen Reactions  . Shrimp [Shellfish Allergy] Nausea And Vomiting     Current outpatient prescriptions:  .  vitamin E 1000 UNIT capsule, Take 1,000 Units by mouth daily., Disp: , Rfl:  .  azithromycin (ZITHROMAX) 600 MG tablet, Take 2 tablets (1,200 mg total) by mouth every 7 (seven) days., Disp: 10 tablet, Rfl: 5 .  dolutegravir (TIVICAY) 50 MG tablet, Take 1 tablet (50 mg total) by mouth daily., Disp: 30 tablet, Rfl: 11 .  dolutegravir (TIVICAY) 50 MG tablet,  Take 1 tablet (50 mg total) by mouth daily., Disp: 30 tablet, Rfl: 11 .  emtricitabine-tenofovir AF (DESCOVY) 200-25 MG tablet, Take 1 tablet by mouth daily., Disp: 30 tablet, Rfl: 11 .  emtricitabine-tenofovir AF (DESCOVY) 200-25 MG tablet, Take 1 tablet by mouth daily., Disp: 30 tablet, Rfl: 11 .  ibuprofen (ADVIL,MOTRIN) 200 MG tablet, Take 400 mg by mouth every 6 (six) hours as needed for headache, mild pain or moderate pain. , Disp: , Rfl:  .  Multiple Vitamin (MULTIVITAMIN WITH MINERALS) TABS tablet, Take 1 tablet by mouth daily., Disp: , Rfl:  .  sulfamethoxazole-trimethoprim (BACTRIM DS,SEPTRA DS) 800-160 MG tablet, Take 1 tablet by mouth 2 (two) times daily., Disp: 30 tablet, Rfl: 11 .  [DISCONTINUED] diphenhydrAMINE (BENADRYL) 25 MG tablet, Take 1 tablet (25 mg total) by mouth every 6 (six) hours. Take for 3 days then as needed for recurrent rash (Patient not taking: Reported on 10/04/2015), Disp: 20 tablet, Rfl: 0   Review of Systems  Constitutional: Negative for fever, chills, diaphoresis, activity change, appetite change, fatigue and unexpected weight change.  HENT: Negative for congestion, rhinorrhea, sinus pressure, sneezing, sore throat and trouble swallowing.   Eyes: Negative for photophobia and visual disturbance.  Respiratory: Positive for shortness of breath. Negative for cough, chest tightness, wheezing and stridor.   Cardiovascular: Positive for chest  pain. Negative for palpitations and leg swelling.  Gastrointestinal: Negative for nausea, vomiting, abdominal pain, diarrhea, constipation, blood in stool, abdominal distention and anal bleeding.  Genitourinary: Negative for dysuria, hematuria, flank pain and difficulty urinating.  Musculoskeletal: Negative for myalgias, back pain, joint swelling, arthralgias and gait problem.  Skin: Negative for color change, pallor, rash and wound.  Neurological: Negative for dizziness, tremors, weakness and light-headedness.  Hematological:  Negative for adenopathy. Does not bruise/bleed easily.  Psychiatric/Behavioral: Negative for behavioral problems, confusion, sleep disturbance, dysphoric mood, decreased concentration and agitation.       Objective:   Physical Exam  Constitutional: He is oriented to person, place, and time. He appears well-developed and well-nourished.  HENT:  Head: Normocephalic and atraumatic.  Eyes: Conjunctivae and EOM are normal.  Neck: Normal range of motion. Neck supple.  Cardiovascular: Normal rate, regular rhythm and normal heart sounds.  Exam reveals no gallop and no friction rub.   No murmur heard. Pulmonary/Chest: Effort normal and breath sounds normal. No respiratory distress. He has no wheezes. He has no rales.  Abdominal: Soft. He exhibits no distension.  Musculoskeletal: Normal range of motion. He exhibits no edema or tenderness.  Neurological: He is alert and oriented to person, place, and time.  Skin: Skin is warm and dry. No rash noted. No erythema. No pallor.  Psychiatric: He has a normal mood and affect. His behavior is normal. Judgment and thought content normal.  Nursing note and vitals reviewed.         Assessment & Plan:   HIV/AIDS: will enroll into ADAP as soon as we have a Viral load and in the meantime enroll into [facility] to get him meds within 48 hours (Tivicay and Descovy). Check Hepatitis panel, HLA status  Labs again in 4 weeks and visit with me in 5  OI propylaxis: bactrim Daily, azithro weekly  Dysphagia, esophagitis: 2 weeks of fluconazole with one refill  I spent greater than 60 minutes with the patient including greater than 50% of time in face to face counsel of the patient and his friend re HIV, AIDS, ARVs, his regimen need for prophylactic meds, his dysphagia and in coordination of his  care.

## 2015-10-09 ENCOUNTER — Telehealth: Payer: Self-pay

## 2015-10-09 ENCOUNTER — Telehealth: Payer: Self-pay | Admitting: *Deleted

## 2015-10-09 LAB — URINALYSIS
Bilirubin Urine: NEGATIVE
Glucose, UA: NEGATIVE
HGB URINE DIPSTICK: NEGATIVE
LEUKOCYTES UA: NEGATIVE
NITRITE: NEGATIVE
PH: 6 (ref 5.0–8.0)
PROTEIN: NEGATIVE
SPECIFIC GRAVITY, URINE: 1.028 (ref 1.001–1.035)

## 2015-10-09 LAB — QUANTIFERON TB GOLD ASSAY (BLOOD)
Interferon Gamma Release Assay: NEGATIVE
Mitogen value: 1.86 IU/mL
QUANTIFERON TB AG MINUS NIL: 0 [IU]/mL
Quantiferon Nil Value: 0.05 IU/mL
TB Ag value: 0.05 IU/mL

## 2015-10-09 LAB — HEPATITIS C ANTIBODY: HCV Ab: NEGATIVE

## 2015-10-09 LAB — HEPATITIS B SURFACE ANTIBODY,QUALITATIVE: HEP B S AB: NEGATIVE

## 2015-10-09 LAB — URINE CYTOLOGY ANCILLARY ONLY
CHLAMYDIA, DNA PROBE: NEGATIVE
Neisseria Gonorrhea: NEGATIVE

## 2015-10-09 LAB — T-HELPER CELL (CD4) - (RCID CLINIC ONLY)
CD4 T CELL ABS: 150 /uL — AB (ref 400–2700)
CD4 T CELL HELPER: 6 % — AB (ref 33–55)

## 2015-10-09 LAB — HEPATITIS B SURFACE ANTIGEN: Hepatitis B Surface Ag: NEGATIVE

## 2015-10-09 LAB — HEPATITIS A ANTIBODY, TOTAL: Hep A Total Ab: NONREACTIVE

## 2015-10-09 LAB — RPR

## 2015-10-09 LAB — HEPATITIS B CORE ANTIBODY, TOTAL: HEP B C TOTAL AB: NONREACTIVE

## 2015-10-09 MED ORDER — FLUCONAZOLE 100 MG PO TABS: 100.0000 mg | ORAL_TABLET | Freq: Every day | ORAL | Status: AC

## 2015-10-09 NOTE — Telephone Encounter (Signed)
Mr. Walter [name] called to ask about taking his vitamins at home. We discussed that at this time we will stop all vitamins and herbal supplements. He will has already picked up his Bactrim from the pharmacy and will pick up the Azithromycin this afternoon. His Tivicay and Descovy will be shipped from [facility] this week.  His fluconazole was not called into the pharmacy. This was addressed and [facility] [address].  I advised the patient that he can take all of the medications at the same time of day and to make sure he does not miss any doses.   Casilda Carlsaylor Seif Teichert, PharmD. PGY-1 Pharmacy Resident Pager: [phone]

## 2015-10-09 NOTE — Addendum Note (Signed)
Addended by: Jennet MaduroESTRIDGE, DENISE D on: 10/09/2015 11:57 AM   Modules accepted: Orders, Medications

## 2015-10-09 NOTE — Telephone Encounter (Signed)
Patient calling for clarification on his over the counter vitamins and supplements.  Patient newly diagnosed, met with Dr. Tommy Medal for the first time 11/14.  RN transferred to Legrand Como, PharmD for consultation and confirmation as the patient stated that the supplements he is concerned about [facility]. Landis Gandy, RN

## 2015-10-10 ENCOUNTER — Telehealth: Payer: Self-pay | Admitting: Pharmacist Clinician (PhC)/ Clinical Pharmacy Specialist

## 2015-10-10 ENCOUNTER — Other Ambulatory Visit: Payer: Self-pay | Admitting: Pharmacist Clinician (PhC)/ Clinical Pharmacy Specialist

## 2015-10-10 LAB — HIV-1 RNA QUANT-NO REFLEX-BLD
HIV 1 RNA QUANT: 461419 {copies}/mL — AB (ref <20)
HIV-1 RNA Quant, Log: 5.66 Log copies/mL — ABNORMAL HIGH (ref <1.30)

## 2015-10-10 NOTE — Telephone Encounter (Signed)
Walter [name] called with questions about his HIV meds and energy pills. He has been approved through [facility] and waiting for delivery while waiting on his ADAP. We are not sure what his "energy" pill is so I told him to avoid completely if possible.

## 2015-10-11 ENCOUNTER — Ambulatory Visit: Payer: Self-pay | Admitting: Pharmacist Clinician (PhC)/ Clinical Pharmacy Specialist

## 2015-10-11 LAB — HIV-1 RNA ULTRAQUANT REFLEX TO GENTYP+
HIV 1 RNA QUANT: 466556 {copies}/mL — AB (ref <20)
HIV-1 RNA QUANT, LOG: 5.67 {Log_copies}/mL — AB (ref <1.30)

## 2015-10-11 LAB — HLA B*5701: HLA-B*5701 w/rflx HLA-B High: NEGATIVE

## 2015-10-11 NOTE — Progress Notes (Signed)
Patient ID: Walter [name], male   DOB: [birth year], 39 y.o.   MRN: [id number]  Walter [name] came by the clinic today for evaluation of his lip and ear peeling. Dr. Luciana Axeomer examined the patient and did not think it was any type of drug adverse reaction.   He recently started Bactrim and Fluconazole on Monday 11/14 and Azithromycin on Tuesday 11/15. Walter [name] was encouraged to use moisturizing cream on the areas and to call if they worsened.   Walter [name] also states that he has not yet received his Tivicay and Descovy from [facility]. Kathie RhodesBetty was able to call to check the status. [facility] informed us that they shipped the medications on 11/15.  Casilda Carlsaylor Brilee Port, PharmD. PGY-1 Pharmacy Resident Pager: [phone]

## 2015-10-15 LAB — REFLEX TO GENOSURE(R) MG: HIV GENOSURE(R) MG PDF: 0

## 2015-10-15 LAB — HIV-1 RNA ULTRAQUANT REFLEX TO GENTYP+
HIV-1 RNA BY PCR: 1450000 copies/mL
HIV-1 RNA Quant, Log: 6.161 log10copy/mL

## 2015-10-18 LAB — HIV-1 GENOTYPR PLUS

## 2015-10-22 ENCOUNTER — Telehealth: Payer: Self-pay | Admitting: Pharmacist Clinician (PhC)/ Clinical Pharmacy Specialist

## 2015-10-22 NOTE — Telephone Encounter (Signed)
Walter [name] was recently started on fluconazole for his oral/esophageal candidiasis. He is about to complete his 2 weeks course and was wondering if he needs to get his refilled. He stated that the issue has completely resolved. D/w Dr Daiva EvesVan Dam, he can stop at this time and he was told not to refill his supply of fluconazole.

## 2015-11-05 ENCOUNTER — Encounter: Payer: Self-pay | Admitting: Infectious Disease

## 2015-11-05 ENCOUNTER — Ambulatory Visit (INDEPENDENT_AMBULATORY_CARE_PROVIDER_SITE_OTHER): Payer: Self-pay | Admitting: Infectious Disease

## 2015-11-05 VITALS — BP 152/85 | HR 101 | Temp 97.9°F | Wt 205.8 lb

## 2015-11-05 DIAGNOSIS — K209 Esophagitis, unspecified without bleeding: Secondary | ICD-10-CM

## 2015-11-05 DIAGNOSIS — Z113 Encounter for screening for infections with a predominantly sexual mode of transmission: Secondary | ICD-10-CM

## 2015-11-05 DIAGNOSIS — B20 Human immunodeficiency virus [HIV] disease: Secondary | ICD-10-CM

## 2015-11-05 DIAGNOSIS — Z23 Encounter for immunization: Secondary | ICD-10-CM

## 2015-11-05 LAB — COMPLETE METABOLIC PANEL WITH GFR
ALBUMIN: 4 g/dL (ref 3.6–5.1)
ALK PHOS: 95 U/L (ref 40–115)
ALT: 34 U/L (ref 9–46)
AST: 24 U/L (ref 10–40)
BILIRUBIN TOTAL: 0.4 mg/dL (ref 0.2–1.2)
BUN: 12 mg/dL (ref 7–25)
CO2: 24 mmol/L (ref 20–31)
CREATININE: 1.02 mg/dL (ref 0.60–1.35)
Calcium: 9.4 mg/dL (ref 8.6–10.3)
Chloride: 103 mmol/L (ref 98–110)
GLUCOSE: 114 mg/dL — AB (ref 65–99)
Potassium: 4.1 mmol/L (ref 3.5–5.3)
SODIUM: 137 mmol/L (ref 135–146)
TOTAL PROTEIN: 7.7 g/dL (ref 6.1–8.1)

## 2015-11-05 LAB — CBC WITH DIFFERENTIAL/PLATELET
BASOS PCT: 2 % — AB (ref 0–1)
Basophils Absolute: 0.1 10*3/uL (ref 0.0–0.1)
EOS ABS: 0.3 10*3/uL (ref 0.0–0.7)
EOS PCT: 10 % — AB (ref 0–5)
HCT: 41.9 % (ref 39.0–52.0)
Hemoglobin: 13.8 g/dL (ref 13.0–17.0)
LYMPHS ABS: 1 10*3/uL (ref 0.7–4.0)
Lymphocytes Relative: 36 % (ref 12–46)
MCH: 31.4 pg (ref 26.0–34.0)
MCHC: 32.9 g/dL (ref 30.0–36.0)
MCV: 95.2 fL (ref 78.0–100.0)
MONOS PCT: 13 % — AB (ref 3–12)
MPV: 9.1 fL (ref 8.6–12.4)
Monocytes Absolute: 0.4 10*3/uL (ref 0.1–1.0)
NEUTROS PCT: 39 % — AB (ref 43–77)
Neutro Abs: 1.1 10*3/uL — ABNORMAL LOW (ref 1.7–7.7)
PLATELETS: 225 10*3/uL (ref 150–400)
RBC: 4.4 MIL/uL (ref 4.22–5.81)
RDW: 15.3 % (ref 11.5–15.5)
WBC: 2.7 10*3/uL — ABNORMAL LOW (ref 4.0–10.5)

## 2015-11-05 MED ORDER — EMTRICITABINE-TENOFOVIR AF 200-25 MG PO TABS: 1.0000 | ORAL_TABLET | Freq: Every day | ORAL | Status: DC

## 2015-11-05 MED ORDER — SULFAMETHOXAZOLE-TRIMETHOPRIM 800-160 MG PO TABS: 1.0000 | ORAL_TABLET | Freq: Every day | ORAL | Status: DC

## 2015-11-05 MED ORDER — SULFAMETHOXAZOLE-TRIMETHOPRIM 800-160 MG PO TABS: 1.0000 | ORAL_TABLET | Freq: Two times a day (BID) | ORAL | Status: DC

## 2015-11-05 MED ORDER — DOLUTEGRAVIR SODIUM 50 MG PO TABS: 50.0000 mg | ORAL_TABLET | Freq: Every day | ORAL | Status: DC

## 2015-11-05 NOTE — Progress Notes (Signed)
Chief complaint: Follow-up for his HIV and AIDS which was newly diagnosed and having recently been put on antiretrovirals roughly a month ago. Subjective:    Patient ID: Walter [name], male    DOB: Apr 05, 1976, 39 y.o.   MRN: [id number]  HIV Positive/AIDS   39 year old [place] American man who had last tested for HIV - over 10 years ago. He had presented to the EDwith significant dyspnea, SOB, right sided chest pain, burping. CXR without pathology. Labs significant for leukopenia. Rapid HIV and 4th generation HIV test were both positive for HIV and CD4 was only 30. His HIV VL was > 1 million and genotype without resistance. We   worked into clinic urgently given severity of his disease and we enrolled him into [facility]. He has been on TIVICAY and DESCOVY for 3-4 weeks and has been highly adherent to this regimen. He has had no side effects from it.  Past Medical History  Diagnosis Date  . AIDS ([facility]) 10/08/2015  . Acute esophagitis 10/08/2015    History reviewed. No pertinent past surgical history.  Family History  Problem Relation Age of Onset  . Hypertension Mother       Social History   Social History  . Marital Status: Single    Spouse Name: N/A  . Number of Children: N/A  . Years of Education: N/A   Social History Main Topics  . Smoking status: Never Smoker   . Smokeless tobacco: None  . Alcohol Use: 0.0 oz/week    0 Standard drinks or equivalent per week     Comment: Once a week.   . Drug Use: No  . Sexual Activity: Not Currently   Other Topics Concern  . None   Social History Narrative    Allergies  Allergen Reactions  . Shrimp [Shellfish Allergy] Nausea And Vomiting     Current outpatient prescriptions:  .  dolutegravir (TIVICAY) 50 MG tablet, Take 1 tablet (50 mg total) by mouth daily., Disp: 30 tablet, Rfl: 11 .  emtricitabine-tenofovir AF (DESCOVY) 200-25 MG tablet, Take 1 tablet by mouth daily., Disp: 30 tablet, Rfl: 11 .  ibuprofen  (ADVIL,MOTRIN) 200 MG tablet, Take 400 mg by mouth every 6 (six) hours as needed for headache, mild pain or moderate pain. , Disp: , Rfl:  .  Multiple Vitamin (MULTIVITAMIN WITH MINERALS) TABS tablet, Take 1 tablet by mouth daily., Disp: , Rfl:  .  sulfamethoxazole-trimethoprim (BACTRIM DS,SEPTRA DS) 800-160 MG tablet, Take 1 tablet by mouth daily., Disp: 30 tablet, Rfl: 11 .  [DISCONTINUED] diphenhydrAMINE (BENADRYL) 25 MG tablet, Take 1 tablet (25 mg total) by mouth every 6 (six) hours. Take for 3 days then as needed for recurrent rash (Patient not taking: Reported on 10/04/2015), Disp: 20 tablet, Rfl: 0   Review of Systems  Constitutional: Negative for fever, chills, diaphoresis, activity change, appetite change, fatigue and unexpected weight change.  HENT: Negative for congestion, rhinorrhea, sinus pressure, sneezing, sore throat and trouble swallowing.   Eyes: Negative for photophobia and visual disturbance.  Respiratory: Negative for cough, chest tightness, shortness of breath, wheezing and stridor.   Cardiovascular: Negative for chest pain, palpitations and leg swelling.  Gastrointestinal: Negative for nausea, vomiting, abdominal pain, diarrhea, constipation, blood in stool, abdominal distention and anal bleeding.  Genitourinary: Negative for dysuria, hematuria, flank pain and difficulty urinating.  Musculoskeletal: Negative for myalgias, back pain, joint swelling, arthralgias and gait problem.  Skin: Negative for color change, pallor, rash and wound.  Neurological: Negative for dizziness, tremors, weakness and light-headedness.  Hematological: Negative for adenopathy. Does not bruise/bleed easily.  Psychiatric/Behavioral: Negative for behavioral problems, confusion, sleep disturbance, dysphoric mood, decreased concentration and agitation.       Objective:   Physical Exam  Constitutional: He is oriented to person, place, and time. He appears well-developed and well-nourished.  HENT:    Head: Normocephalic and atraumatic.  Eyes: Conjunctivae and EOM are normal.  Neck: Normal range of motion. Neck supple.  Cardiovascular: Normal rate, regular rhythm and normal heart sounds.  Exam reveals no gallop and no friction rub.   No murmur heard. Pulmonary/Chest: Effort normal and breath sounds normal. No respiratory distress. He has no wheezes. He has no rales.  Abdominal: Soft. He exhibits no distension.  Musculoskeletal: Normal range of motion. He exhibits no edema or tenderness.  Neurological: He is alert and oriented to person, place, and time.  Skin: Skin is warm and dry. No rash noted. No erythema. No pallor.  Psychiatric: He has a normal mood and affect. His behavior is normal. Judgment and thought content normal.  Nursing note and vitals reviewed.         Assessment & Plan:   HIV/AIDS: continue TIVICAY and DESCOVY. [facility] but he wanted to stay on the current regimen. Recheck labs today discontinue azithromycin continue Bactrim for PCP prophylaxis. We'll bring him back in 2 months time and ensure that he reenrolled and 8 up in the winter.  OI propylaxis: bactrim Daily  Dysphagia, esophagitis: 2 weeks of fluconazole have taken care of this  STD screening: Will screen for syphilis oral rectal and urinary gonorrhea and chlamydia  I spent greater than 40 minutes with the patient including greater than 50% of time in face to face counsel of the patient and his friend re HIV, AIDS,his regimen need for prophylactic meds, STD screening and in coordination of his  care.

## 2015-11-05 NOTE — Patient Instructions (Signed)
Labs today  Come back for Hep B #2 in ONE MONTH and RENEW ADAP then  [facility] for labs in 2 months and visit with Dr. Daiva EvesVan Dam after then  [facility]

## 2015-11-06 LAB — RPR

## 2015-11-06 LAB — T-HELPER CELL (CD4) - (RCID CLINIC ONLY)
CD4 % Helper T Cell: 9 % — ABNORMAL LOW (ref 33–55)
CD4 T Cell Abs: 80 /uL — ABNORMAL LOW (ref 400–2700)

## 2015-11-06 LAB — HIV RNA, RTPCR W/R GT (RTI, PI,INT)
HIV 1 RNA Quant: 288 copies/mL — ABNORMAL HIGH (ref <20)
HIV-1 RNA QUANT, LOG: 2.46 {Log_copies}/mL — AB (ref <1.30)

## 2015-11-06 LAB — CYTOLOGY, (ORAL, ANAL, URETHRAL) ANCILLARY ONLY
CHLAMYDIA, DNA PROBE: NEGATIVE
CHLAMYDIA, DNA PROBE: NEGATIVE
NEISSERIA GONORRHEA: NEGATIVE
Neisseria Gonorrhea: NEGATIVE

## 2015-11-06 LAB — URINE CYTOLOGY ANCILLARY ONLY
Chlamydia: NEGATIVE
NEISSERIA GONORRHEA: NEGATIVE

## 2015-12-17 ENCOUNTER — Telehealth: Payer: Self-pay | Admitting: *Deleted

## 2015-12-17 NOTE — Telephone Encounter (Signed)
RN received a referral from Dr Daiva Eves for [facility]. Purpose of referral is to assist the patient with medication adherence. RN reviewed the patient's chart prior to contacting him. RN contacted the patient and had to leave a message. Voice message does not identify the patient so a vague message was left stating my name and that I am calling from your Dr's office to f/u on your progression and to assess how well you are tolerating the new medications. RN left my number for a return call.

## 2015-12-17 NOTE — Telephone Encounter (Signed)
Thanks Ambre! 

## 2015-12-17 NOTE — Telephone Encounter (Signed)
RN received a return call back from Mr Cavanagh who states he is doing fantastic with taking his medications. Mr Caraher states he is looking forward to coming in for lab work on the 30th with plans of seeing Dr Daiva Eves on the 1st of March. RN reminded Mr Persky to be sure to renew his ADAP before the 10th of Feb. Patient stated he already has plans to get it renewed during his lab appt on the 30th. RN explained to Mr. Rondinelli my role at the clinic and made myself available to him to assist with medication adherence. Patient thanked me and questioned if it is ok for him to drink Naked and Aloe Vera Juice while on his medications. RN confirmed that that should be fine. Patient also asked about taking Ginseng shots(oral doses of ginseng) RN informed Mr. Zeiter that I am not sure about that since the additives may cause concerns. RN asked the patient if the shots are something he would be taking Daily? Patient stated he is not sure but wanted to ask about it. RN advised the patient that I would like to speak with the pharmacist about that question and get back with him. At this time I would like to f/u on the patient's lab work to see how well he is doing with his medication adherence

## 2015-12-24 ENCOUNTER — Other Ambulatory Visit (INDEPENDENT_AMBULATORY_CARE_PROVIDER_SITE_OTHER): Payer: Self-pay

## 2015-12-24 DIAGNOSIS — B20 Human immunodeficiency virus [HIV] disease: Secondary | ICD-10-CM

## 2015-12-24 DIAGNOSIS — Z113 Encounter for screening for infections with a predominantly sexual mode of transmission: Secondary | ICD-10-CM

## 2015-12-24 LAB — CBC WITH DIFFERENTIAL/PLATELET
BASOS ABS: 0 10*3/uL (ref 0.0–0.1)
BASOS PCT: 1 % (ref 0–1)
EOS ABS: 0.1 10*3/uL (ref 0.0–0.7)
EOS PCT: 3 % (ref 0–5)
HCT: 45.8 % (ref 39.0–52.0)
Hemoglobin: 15.6 g/dL (ref 13.0–17.0)
LYMPHS ABS: 2.1 10*3/uL (ref 0.7–4.0)
Lymphocytes Relative: 61 % — ABNORMAL HIGH (ref 12–46)
MCH: 33.1 pg (ref 26.0–34.0)
MCHC: 34.1 g/dL (ref 30.0–36.0)
MCV: 97.2 fL (ref 78.0–100.0)
MONOS PCT: 9 % (ref 3–12)
MPV: 8.6 fL (ref 8.6–12.4)
Monocytes Absolute: 0.3 10*3/uL (ref 0.1–1.0)
NEUTROS PCT: 26 % — AB (ref 43–77)
Neutro Abs: 0.9 10*3/uL — ABNORMAL LOW (ref 1.7–7.7)
PLATELETS: 253 10*3/uL (ref 150–400)
RBC: 4.71 MIL/uL (ref 4.22–5.81)
RDW: 15.1 % (ref 11.5–15.5)
WBC: 3.5 10*3/uL — ABNORMAL LOW (ref 4.0–10.5)

## 2015-12-24 LAB — COMPLETE METABOLIC PANEL WITH GFR
ALT: 29 U/L (ref 9–46)
AST: 30 U/L (ref 10–40)
Albumin: 4.3 g/dL (ref 3.6–5.1)
Alkaline Phosphatase: 65 U/L (ref 40–115)
BILIRUBIN TOTAL: 0.5 mg/dL (ref 0.2–1.2)
BUN: 12 mg/dL (ref 7–25)
CHLORIDE: 102 mmol/L (ref 98–110)
CO2: 28 mmol/L (ref 20–31)
Calcium: 9.5 mg/dL (ref 8.6–10.3)
Creat: 1.24 mg/dL (ref 0.60–1.35)
GFR, EST AFRICAN AMERICAN: 84 mL/min (ref >=60)
GFR, EST NON AFRICAN AMERICAN: 73 mL/min (ref >=60)
Glucose, Bld: 103 mg/dL — ABNORMAL HIGH (ref 65–99)
Potassium: 4.2 mmol/L (ref 3.5–5.3)
Sodium: 139 mmol/L (ref 135–146)
TOTAL PROTEIN: 7.7 g/dL (ref 6.1–8.1)

## 2015-12-25 LAB — HIV-1 RNA QUANT-NO REFLEX-BLD
HIV 1 RNA QUANT: 35 {copies}/mL — AB (ref <20)
HIV-1 RNA QUANT, LOG: 1.54 {Log_copies}/mL — AB (ref <1.30)

## 2015-12-25 LAB — T-HELPER CELL (CD4) - (RCID CLINIC ONLY)
CD4 T CELL ABS: 200 /uL — AB (ref 400–2700)
CD4 T CELL HELPER: 10 % — AB (ref 33–55)

## 2015-12-25 LAB — MICROALBUMIN / CREATININE URINE RATIO: Creatinine, Urine: 33 mg/dL (ref 20–370)

## 2015-12-25 LAB — URINE CYTOLOGY ANCILLARY ONLY
Chlamydia: NEGATIVE
Neisseria Gonorrhea: NEGATIVE

## 2016-01-07 ENCOUNTER — Ambulatory Visit: Payer: Self-pay | Admitting: Infectious Disease

## 2016-01-23 ENCOUNTER — Ambulatory Visit (INDEPENDENT_AMBULATORY_CARE_PROVIDER_SITE_OTHER): Payer: Self-pay | Admitting: Infectious Disease

## 2016-01-23 ENCOUNTER — Encounter: Payer: Self-pay | Admitting: Infectious Disease

## 2016-01-23 VITALS — BP 155/95 | HR 71 | Temp 98.0°F

## 2016-01-23 DIAGNOSIS — B20 Human immunodeficiency virus [HIV] disease: Secondary | ICD-10-CM

## 2016-01-23 DIAGNOSIS — I10 Essential (primary) hypertension: Secondary | ICD-10-CM

## 2016-01-23 DIAGNOSIS — Z113 Encounter for screening for infections with a predominantly sexual mode of transmission: Secondary | ICD-10-CM

## 2016-01-23 HISTORY — DX: Encounter for screening for infections with a predominantly sexual mode of transmission: Z11.3

## 2016-01-23 NOTE — Progress Notes (Signed)
Chief complaint: Follow-up for his HIV and AIDS  Subjective:    Patient ID: Walter [name], male    DOB: [birth year], 40 y.o.   MRN: [id number]  HIV Positive/AIDS   40 year old [place] American man diagnosed with HIV/AIDS this fall. We had  enrolled him into [facility] but apparently  ADAP had not initially processed. Marland Kitchen He has been on TIVICAY and DESCOVY with nice suppression with most recent VL   Lab Results  Component Value Date   HIV1RNAQUANT 35* 12/24/2015   Lab Results  Component Value Date   CD4TABS 200* 12/24/2015   CD4TABS 80* 11/05/2015   CD4TABS 150* 10/08/2015     Past Medical History  Diagnosis Date  . AIDS ([facility]) 10/08/2015  . Acute esophagitis 10/08/2015    No past surgical history on file.  Family History  Problem Relation Age of Onset  . Hypertension Mother       Social History   Social History  . Marital Status: Single    Spouse Name: N/A  . Number of Children: N/A  . Years of Education: N/A   Social History Main Topics  . Smoking status: Never Smoker   . Smokeless tobacco: None  . Alcohol Use: 0.0 oz/week    0 Standard drinks or equivalent per week     Comment: Once a week.   . Drug Use: No  . Sexual Activity: Not Currently   Other Topics Concern  . None   Social History Narrative    Allergies  Allergen Reactions  . Shrimp [Shellfish Allergy] Nausea And Vomiting     Current outpatient prescriptions:  .  dolutegravir (TIVICAY) 50 MG tablet, Take 1 tablet (50 mg total) by mouth daily., Disp: 30 tablet, Rfl: 11 .  emtricitabine-tenofovir AF (DESCOVY) 200-25 MG tablet, Take 1 tablet by mouth daily., Disp: 30 tablet, Rfl: 11 .  ibuprofen (ADVIL,MOTRIN) 200 MG tablet, Take 400 mg by mouth every 6 (six) hours as needed for headache, mild pain or moderate pain. , Disp: , Rfl:  .  Multiple Vitamin (MULTIVITAMIN WITH MINERALS) TABS tablet, Take 1 tablet by mouth daily., Disp: , Rfl:  .  sulfamethoxazole-trimethoprim (BACTRIM DS,SEPTRA  DS) 800-160 MG tablet, Take 1 tablet by mouth daily., Disp: 30 tablet, Rfl: 11 .  [DISCONTINUED] diphenhydrAMINE (BENADRYL) 25 MG tablet, Take 1 tablet (25 mg total) by mouth every 6 (six) hours. Take for 3 days then as needed for recurrent rash (Patient not taking: Reported on 10/04/2015), Disp: 20 tablet, Rfl: 0   Review of Systems  Constitutional: Negative for fever, chills, diaphoresis, activity change, appetite change, fatigue and unexpected weight change.  HENT: Negative for congestion, rhinorrhea, sinus pressure, sneezing, sore throat and trouble swallowing.   Eyes: Negative for photophobia and visual disturbance.  Respiratory: Negative for cough, chest tightness, shortness of breath, wheezing and stridor.   Cardiovascular: Negative for chest pain, palpitations and leg swelling.  Gastrointestinal: Negative for nausea, vomiting, abdominal pain, diarrhea, constipation, blood in stool, abdominal distention and anal bleeding.  Genitourinary: Negative for dysuria, hematuria, flank pain and difficulty urinating.  Musculoskeletal: Negative for myalgias, back pain, joint swelling, arthralgias and gait problem.  Skin: Negative for color change, pallor, rash and wound.  Neurological: Negative for dizziness, tremors, weakness and light-headedness.  Hematological: Negative for adenopathy. Does not bruise/bleed easily.  Psychiatric/Behavioral: Negative for behavioral problems, confusion, sleep disturbance, dysphoric mood, decreased concentration and agitation.       Objective:   Physical Exam  Constitutional: He  is oriented to person, place, and time. He appears well-developed and well-nourished.  HENT:  Head: Normocephalic and atraumatic.  Eyes: Conjunctivae and EOM are normal.  Neck: Normal range of motion. Neck supple.  Cardiovascular: Normal rate, regular rhythm and normal heart sounds.  Exam reveals no gallop and no friction rub.   No murmur heard. Pulmonary/Chest: Effort normal and  breath sounds normal. No respiratory distress. He has no wheezes. He has no rales.  Abdominal: Soft. He exhibits no distension.  Musculoskeletal: Normal range of motion. He exhibits no edema or tenderness.  Neurological: He is alert and oriented to person, place, and time.  Skin: Skin is warm and dry. No rash noted. No erythema. No pallor.  Psychiatric: He has a normal mood and affect. His behavior is normal. Judgment and thought content normal.  Nursing note and vitals reviewed.         Assessment & Plan:   HIV/AIDS: continue TIVICAY and DESCOVY. \continue Bactrim for PCP prophylaxis but can change to TIW  We worked with Marcelino Duster and his ADAP should kick in shortly  [place] bring him back in 2 months time.  HTN: has emerged as he has gained weight. He will need treatment if this persists  I spent greater than 25 minutes with the patient including greater than 50% of time in face to face counsel of the patient and his friend re HIV, AIDS,his regimen need for prophylactic meds, STD, hTN screening and in coordination of his  care.

## 2016-01-31 ENCOUNTER — Telehealth: Payer: Self-pay | Admitting: *Deleted

## 2016-01-31 NOTE — Telephone Encounter (Signed)
 100mg  take 2 tablets first day then one tablet daily x 13 days. Has he been taking his ARVs?

## 2016-01-31 NOTE — Telephone Encounter (Signed)
Pt requesting rx for thrush.  MD advise.  Pt has ADAP.

## 2016-02-01 ENCOUNTER — Telehealth: Payer: Self-pay | Admitting: *Deleted

## 2016-02-01 ENCOUNTER — Other Ambulatory Visit: Payer: Self-pay | Admitting: *Deleted

## 2016-02-01 DIAGNOSIS — B370 Candidal stomatitis: Secondary | ICD-10-CM

## 2016-02-01 MED ORDER — FLUCONAZOLE 100 MG PO TABS: 100.0000 mg | ORAL_TABLET | Freq: Every day | ORAL | Status: DC

## 2016-02-01 NOTE — Telephone Encounter (Signed)
[facility]. Per patient he has been taking his ARVs consistently; however he recently had cold symptoms. Wendall MolaJacqueline Mishelle Hassan

## 2016-02-01 NOTE — Telephone Encounter (Signed)
Thanks Jackie 

## 2016-03-11 ENCOUNTER — Other Ambulatory Visit (INDEPENDENT_AMBULATORY_CARE_PROVIDER_SITE_OTHER): Payer: Self-pay

## 2016-03-11 DIAGNOSIS — B20 Human immunodeficiency virus [HIV] disease: Secondary | ICD-10-CM

## 2016-03-11 DIAGNOSIS — Z113 Encounter for screening for infections with a predominantly sexual mode of transmission: Secondary | ICD-10-CM

## 2016-03-11 LAB — COMPLETE METABOLIC PANEL WITH GFR
ALT: 31 U/L (ref 9–46)
AST: 35 U/L (ref 10–40)
Albumin: 4.1 g/dL (ref 3.6–5.1)
Alkaline Phosphatase: 70 U/L (ref 40–115)
BILIRUBIN TOTAL: 0.5 mg/dL (ref 0.2–1.2)
BUN: 12 mg/dL (ref 7–25)
CHLORIDE: 105 mmol/L (ref 98–110)
CO2: 24 mmol/L (ref 20–31)
CREATININE: 1.16 mg/dL (ref 0.60–1.35)
Calcium: 8.9 mg/dL (ref 8.6–10.3)
GFR, Est Non African American: 79 mL/min (ref >=60)
Glucose, Bld: 108 mg/dL — ABNORMAL HIGH (ref 65–99)
Potassium: 4.4 mmol/L (ref 3.5–5.3)
Sodium: 140 mmol/L (ref 135–146)
TOTAL PROTEIN: 7.3 g/dL (ref 6.1–8.1)

## 2016-03-11 LAB — CBC WITH DIFFERENTIAL/PLATELET
BASOS ABS: 33 {cells}/uL (ref 0–200)
Basophils Relative: 1 %
Eosinophils Absolute: 66 cells/uL (ref 15–500)
Eosinophils Relative: 2 %
HCT: 47.1 % (ref 38.5–50.0)
Hemoglobin: 15.8 g/dL (ref 13.2–17.1)
LYMPHS PCT: 55 %
Lymphs Abs: 1815 cells/uL (ref 850–3900)
MCH: 32 pg (ref 27.0–33.0)
MCHC: 33.5 g/dL (ref 32.0–36.0)
MCV: 95.3 fL (ref 80.0–100.0)
MONOS PCT: 9 %
MPV: 9 fL (ref 7.5–12.5)
Monocytes Absolute: 297 cells/uL (ref 200–950)
NEUTROS PCT: 33 %
Neutro Abs: 1089 cells/uL — ABNORMAL LOW (ref 1500–7800)
PLATELETS: 226 10*3/uL (ref 140–400)
RBC: 4.94 MIL/uL (ref 4.20–5.80)
RDW: 13.8 % (ref 11.0–15.0)
WBC: 3.3 10*3/uL — ABNORMAL LOW (ref 3.8–10.8)

## 2016-03-12 LAB — T-HELPER CELL (CD4) - (RCID CLINIC ONLY)
CD4 % Helper T Cell: 11 % — ABNORMAL LOW (ref 33–55)
CD4 T Cell Abs: 200 /uL — ABNORMAL LOW (ref 400–2700)

## 2016-03-12 LAB — HIV-1 RNA QUANT-NO REFLEX-BLD
HIV 1 RNA QUANT: 31 {copies}/mL — AB (ref <20)
HIV-1 RNA Quant, Log: 1.49 Log copies/mL — ABNORMAL HIGH (ref <1.30)

## 2016-03-12 LAB — RPR

## 2016-03-19 ENCOUNTER — Other Ambulatory Visit: Payer: Self-pay | Admitting: Infectious Disease

## 2016-03-25 ENCOUNTER — Ambulatory Visit: Payer: Self-pay | Admitting: Infectious Disease

## 2016-04-07 ENCOUNTER — Encounter: Payer: Self-pay | Admitting: Infectious Disease

## 2016-04-07 ENCOUNTER — Ambulatory Visit (INDEPENDENT_AMBULATORY_CARE_PROVIDER_SITE_OTHER): Payer: Self-pay | Admitting: Infectious Disease

## 2016-04-07 VITALS — BP 143/92 | HR 70 | Temp 98.0°F | Ht 69.0 in | Wt 242.0 lb

## 2016-04-07 DIAGNOSIS — I10 Essential (primary) hypertension: Secondary | ICD-10-CM

## 2016-04-07 DIAGNOSIS — B20 Human immunodeficiency virus [HIV] disease: Secondary | ICD-10-CM

## 2016-04-07 NOTE — Progress Notes (Signed)
Chief complaint: Follow-up for his HIV and AIDS  Subjective:    Patient ID: Walter [name], male    DOB: 1976-10-30, 40 y.o.   MRN: [id number]  HPI  40 year old [place] man diagnosed with HIV/AIDS this fall. We had  [facility] but apparently  ADAP had not initially processed. Marland Kitchen. He has been on TIVICAY and DESCOVY with nice suppression with most recent VL   Lab Results  Component Value Date   HIV1RNAQUANT 31* 03/11/2016   HIV1RNAQUANT 35* 12/24/2015   HIV1RNAQUANT 288* 11/05/2015      Lab Results  Component Value Date   CD4TABS 200* 03/11/2016   CD4TABS 200* 12/24/2015   CD4TABS 80* 11/05/2015     Past Medical History  Diagnosis Date  . AIDS (HCC) 10/08/2015  . Acute esophagitis 10/08/2015  . Screen for STD (sexually transmitted disease) 01/23/2016    No past surgical history on file.  Family History  Problem Relation Age of Onset  . Hypertension Mother       Social History   Social History  . Marital Status: Single    Spouse Name: N/A  . Number of Children: N/A  . Years of Education: N/A   Social History Main Topics  . Smoking status: Never Smoker   . Smokeless tobacco: None  . Alcohol Use: 0.0 oz/week    0 Standard drinks or equivalent per week     Comment: Once a week.   . Drug Use: No  . Sexual Activity:    Partners: Male     Comment: pt given condoms   Other Topics Concern  . None   Social History Narrative    Allergies  Allergen Reactions  . Shrimp [Shellfish Allergy] Nausea And Vomiting     Current outpatient prescriptions:  .  dolutegravir (TIVICAY) 50 MG tablet, Take 1 tablet (50 mg total) by mouth daily., Disp: 30 tablet, Rfl: 11 .  emtricitabine-tenofovir AF (DESCOVY) 200-25 MG tablet, Take 1 tablet by mouth daily., Disp: 30 tablet, Rfl: 11 .  ibuprofen (ADVIL,MOTRIN) 200 MG tablet, Take 400 mg by mouth every 6 (six) hours as needed for headache, mild pain or moderate pain. , Disp: , Rfl:  .  Multiple  Vitamin (MULTIVITAMIN WITH MINERALS) TABS tablet, Take 1 tablet by mouth daily., Disp: , Rfl:  .  [DISCONTINUED] diphenhydrAMINE (BENADRYL) 25 MG tablet, Take 1 tablet (25 mg total) by mouth every 6 (six) hours. Take for 3 days then as needed for recurrent rash (Patient not taking: Reported on 10/04/2015), Disp: 20 tablet, Rfl: 0   Review of Systems  Constitutional: Negative for fever, chills, diaphoresis, activity change, appetite change, fatigue and unexpected weight change.  HENT: Negative for congestion, rhinorrhea, sinus pressure, sneezing, sore throat and trouble swallowing.   Eyes: Negative for photophobia and visual disturbance.  Respiratory: Negative for cough, chest tightness, shortness of breath, wheezing and stridor.   Cardiovascular: Negative for chest pain, palpitations and leg swelling.  Gastrointestinal: Negative for nausea, vomiting, abdominal pain, diarrhea, constipation, blood in stool, abdominal distention and anal bleeding.  Genitourinary: Negative for dysuria, hematuria, flank pain and difficulty urinating.  Musculoskeletal: Negative for myalgias, back pain, joint swelling, arthralgias and gait problem.  Skin: Negative for color change, pallor, rash and wound.  Neurological: Negative for dizziness, tremors, weakness and light-headedness.  Hematological: Negative for adenopathy. Does not bruise/bleed easily.  Psychiatric/Behavioral: Negative for behavioral problems, confusion, sleep disturbance, dysphoric mood, decreased concentration and agitation.  Objective:   Physical Exam  Constitutional: He is oriented to person, place, and time. He appears well-developed and well-nourished.  HENT:  Head: Normocephalic and atraumatic.  Eyes: Conjunctivae and EOM are normal.  Neck: Normal range of motion. Neck supple.  Cardiovascular: Normal rate, regular rhythm and normal heart sounds.  Exam reveals no gallop and no friction rub.   No murmur heard. Pulmonary/Chest: Effort  normal and breath sounds normal. No respiratory distress. He has no wheezes. He has no rales.  Abdominal: Soft. He exhibits no distension.  Musculoskeletal: Normal range of motion. He exhibits no edema or tenderness.  Neurological: He is alert and oriented to person, place, and time.  Skin: Skin is warm and dry. No rash noted. No erythema. No pallor.  Psychiatric: He has a normal mood and affect. His behavior is normal. Judgment and thought content normal.  Nursing note and vitals reviewed.         Assessment & Plan:   HIV/AIDS: continue TIVICAY and DESCOVY. DC bactrim and fluconazole   HTN:  Will likely need a BP med Filed Vitals:   04/07/16 1041  BP: 143/92  Pulse: 70  Temp: 98 F (36.7 C)    Hx of esophagitis : not active

## 2016-05-13 IMAGING — CT CT ANGIO CHEST
2 of 6 series · 19 of 36 positions shown · IV contrast (OMNIPAQUE)
Comparison: Chest radiograph from earlier today.

CLINICAL DATA: Shortness of breath.  Right chest pain.

EXAM:
CT ANGIOGRAPHY CHEST WITH CONTRAST
TECHNIQUE: Multidetector CT imaging of the chest was performed using the
standard protocol during bolus administration of intravenous
contrast. Multiplanar CT image reconstructions and MIPs were
obtained to evaluate the vascular anatomy.
CONTRAST:  100mL OMNIPAQUE IOHEXOL 350 MG/ML SOLN

[Series 6: pe thins @ 1mm · axial · 0.74mm/px · z∈[-300,-66]mm · 18 of 260 slices shown]
[im 13/260  lung]
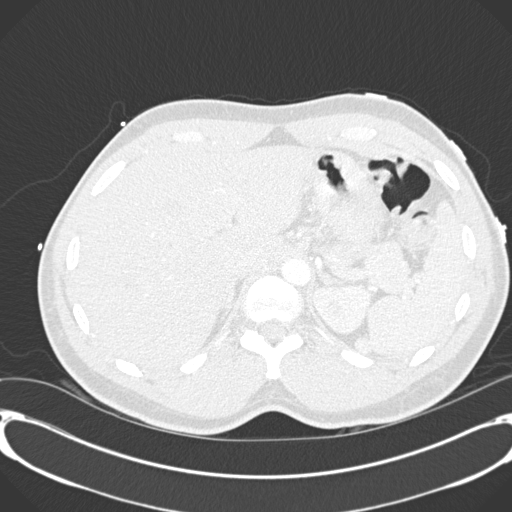
[im 26/260  mediastinal]
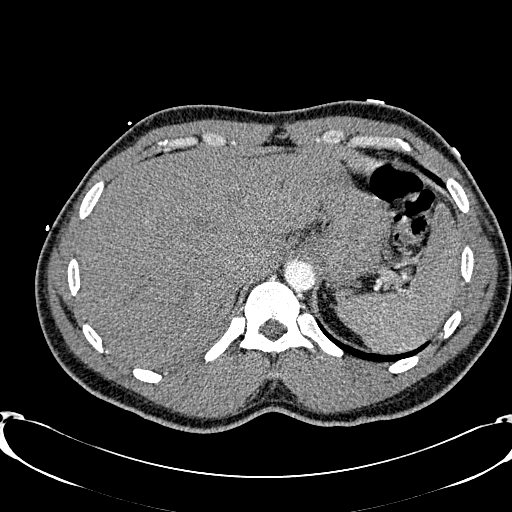
[im 39/260  lung]
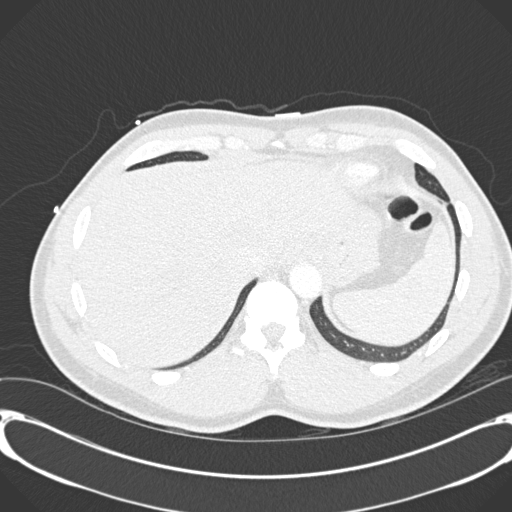
[im 52/260  mediastinal]
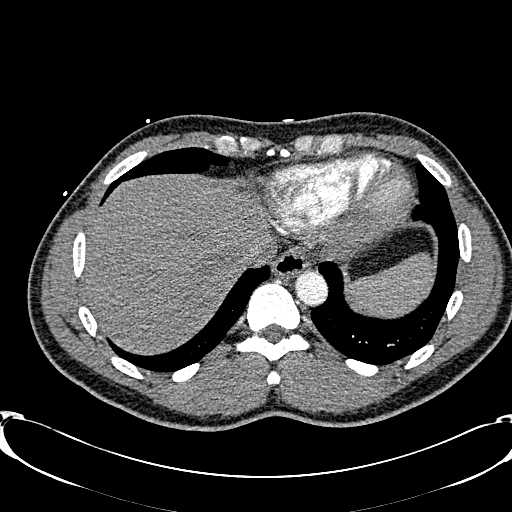
[im 65/260  lung]
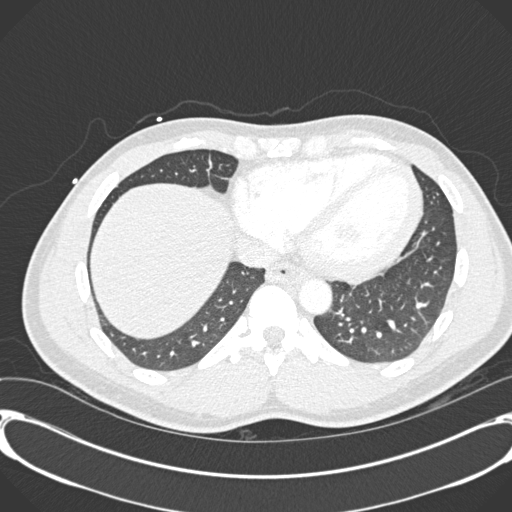
[im 78/260  mediastinal]
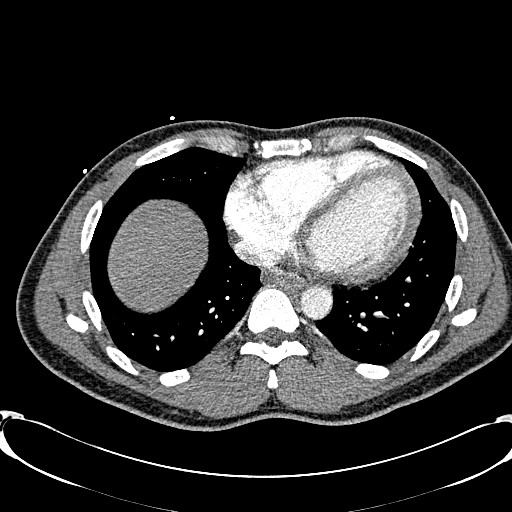
[im 91/260  lung]
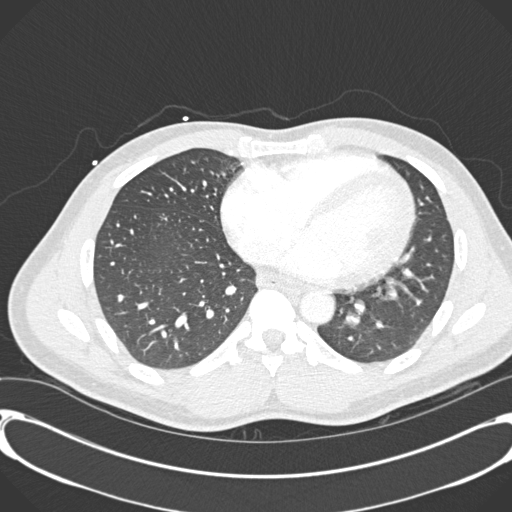
[im 104/260  mediastinal]
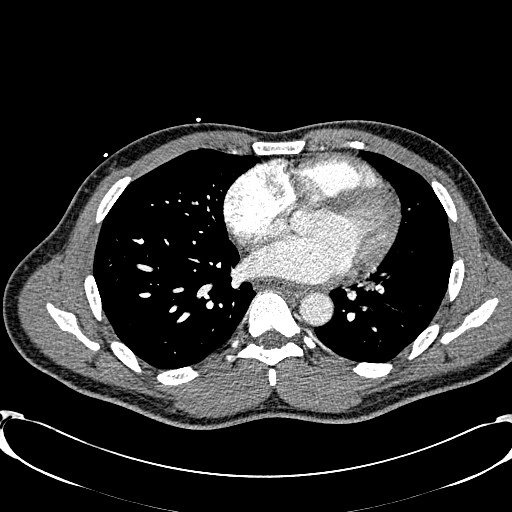
[im 117/260  lung]
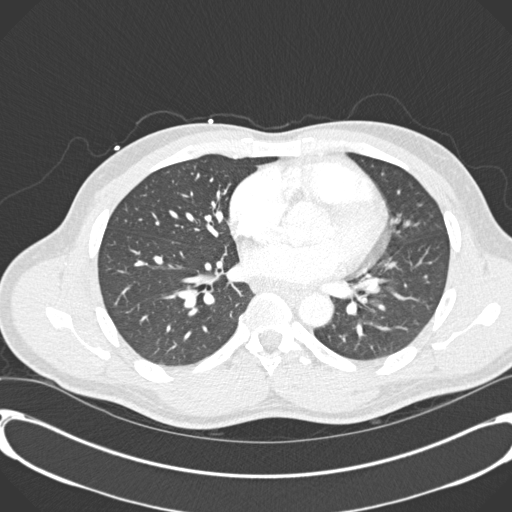
[im 143/260  mediastinal]
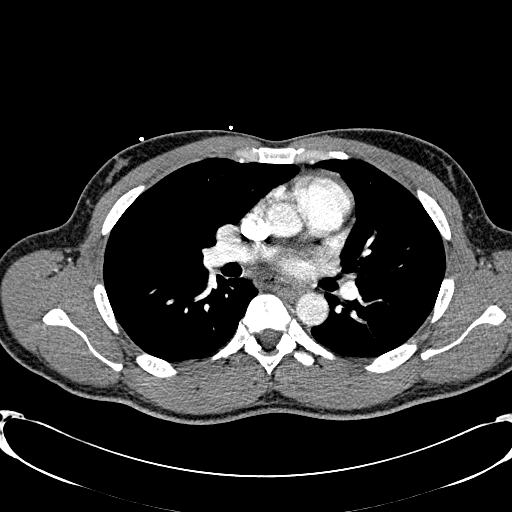
[im 156/260  lung]
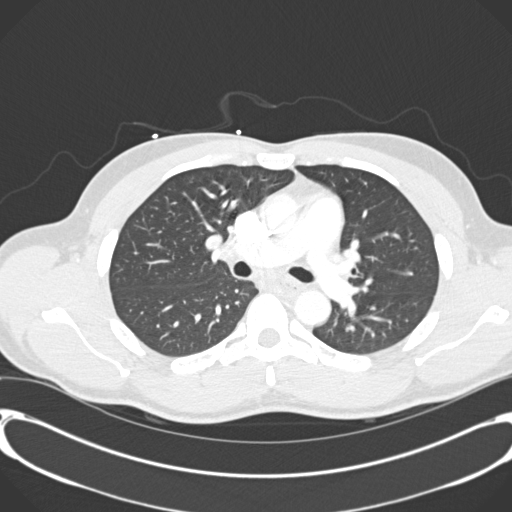
[im 169/260  mediastinal]
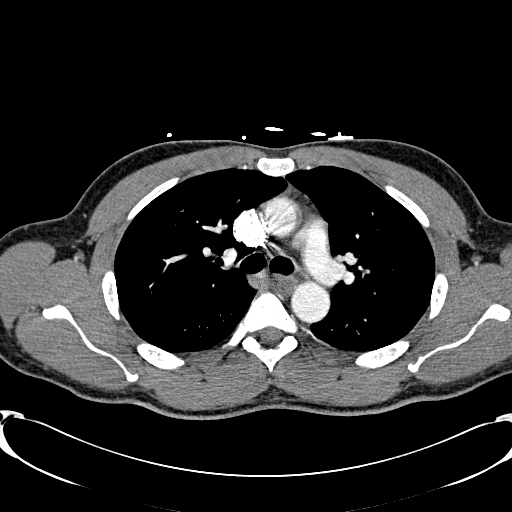
[im 182/260  lung]
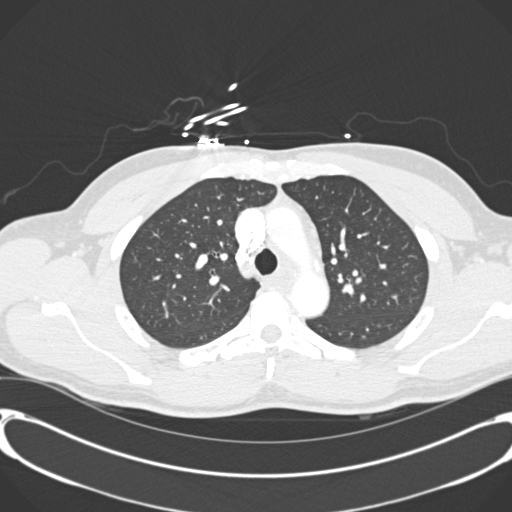
[im 195/260  mediastinal]
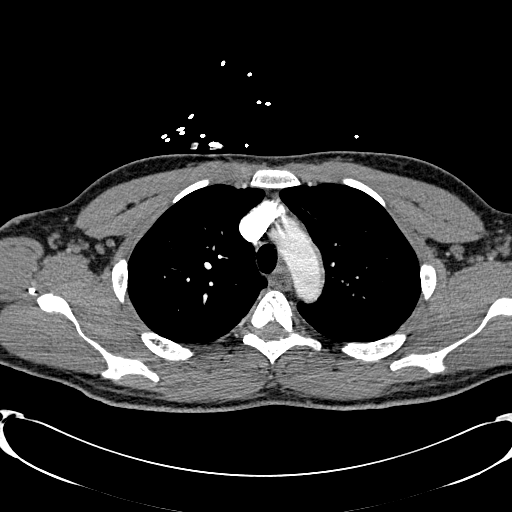
[im 208/260  lung]
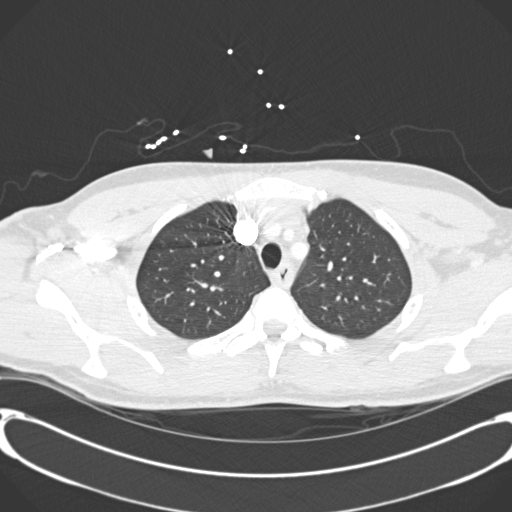
[im 221/260  mediastinal]
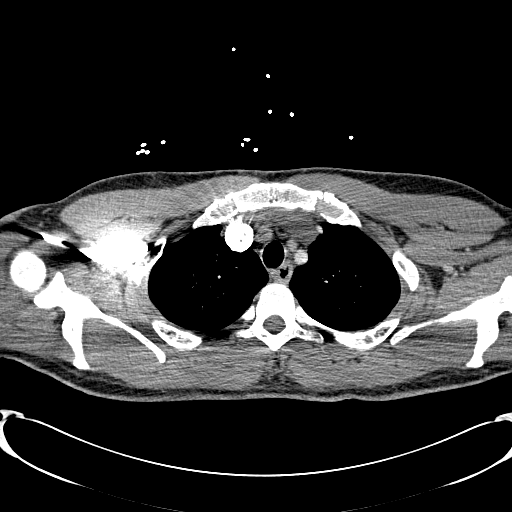
[im 234/260  lung]
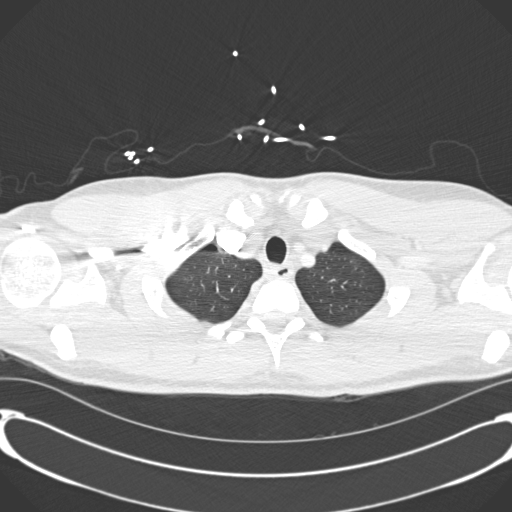
[im 247/260  mediastinal]
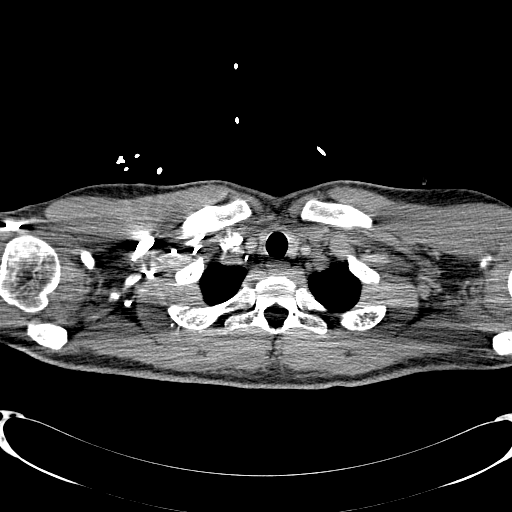

[Series 602: <mpr thick range> · coronal · 0.74mm/px · 1 of 78 slices shown]
[im 39/78  mediastinal]
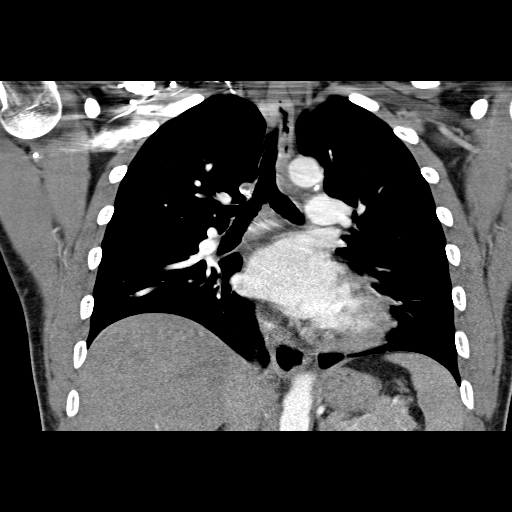

[19 of 36 positions shown; findings below may reference images not displayed]

FINDINGS: Mediastinum/Nodes: The study is high quality for the evaluation of
pulmonary embolism. There are no filling defects in the central,
lobar, segmental or subsegmental pulmonary artery branches to
suggest acute pulmonary embolism. Great vessels are normal in course
and caliber. Normal heart size. No pericardial fluid/thickening.
Normal visualized thyroid. There is mild circumferential wall
thickening in the lower thoracic esophagus. Fluid and debris is
noted in the mid thoracic esophageal lumen. No pathologically
enlarged axillary, mediastinal or hilar lymph nodes.

Lungs/Pleura: No pneumothorax. No pleural effusion. No acute
consolidative airspace disease, significant pulmonary nodules or
lung masses.

Upper abdomen: Unremarkable.

Musculoskeletal: No aggressive appearing focal osseous lesions. Mild
symmetric gynecomastia.

Review of the MIP images confirms the above findings.
IMPRESSION: 1. No pulmonary embolism.  No active pulmonary disease.
2. Mild circumferential wall thickening in the lower thoracic
esophagus, a nonspecific finding that suggests esophagitis with
other etiologies not excluded. Fluid and debris in the mid thoracic
esophageal lumen suggests esophageal dysmotility and/ or
gastroesophageal reflux.

## 2016-06-04 ENCOUNTER — Other Ambulatory Visit: Payer: Self-pay

## 2016-06-04 DIAGNOSIS — Z79899 Other long term (current) drug therapy: Secondary | ICD-10-CM

## 2016-06-04 DIAGNOSIS — B20 Human immunodeficiency virus [HIV] disease: Secondary | ICD-10-CM

## 2016-06-04 LAB — CBC WITH DIFFERENTIAL/PLATELET
BASOS ABS: 30 {cells}/uL (ref 0–200)
Basophils Relative: 1 %
EOS PCT: 3 %
Eosinophils Absolute: 90 cells/uL (ref 15–500)
HCT: 46.9 % (ref 38.5–50.0)
HEMOGLOBIN: 15.7 g/dL (ref 13.2–17.1)
LYMPHS PCT: 53 %
Lymphs Abs: 1590 cells/uL (ref 850–3900)
MCH: 32.8 pg (ref 27.0–33.0)
MCHC: 33.5 g/dL (ref 32.0–36.0)
MCV: 97.9 fL (ref 80.0–100.0)
MPV: 9 fL (ref 7.5–12.5)
Monocytes Absolute: 330 cells/uL (ref 200–950)
Monocytes Relative: 11 %
NEUTROS PCT: 32 %
Neutro Abs: 960 cells/uL — ABNORMAL LOW (ref 1500–7800)
Platelets: 249 10*3/uL (ref 140–400)
RBC: 4.79 MIL/uL (ref 4.20–5.80)
RDW: 13.8 % (ref 11.0–15.0)
WBC: 3 10*3/uL — AB (ref 3.8–10.8)

## 2016-06-04 LAB — LIPID PANEL
CHOL/HDL RATIO: 4 ratio (ref <=5.0)
Cholesterol: 182 mg/dL (ref 125–200)
HDL: 46 mg/dL (ref >=40)
LDL CALC: 119 mg/dL (ref <130)
Triglycerides: 85 mg/dL (ref <150)
VLDL: 17 mg/dL (ref <30)

## 2016-06-04 LAB — COMPLETE METABOLIC PANEL WITH GFR
ALBUMIN: 4.2 g/dL (ref 3.6–5.1)
ALT: 26 U/L (ref 9–46)
AST: 25 U/L (ref 10–40)
Alkaline Phosphatase: 83 U/L (ref 40–115)
BUN: 11 mg/dL (ref 7–25)
CALCIUM: 8.8 mg/dL (ref 8.6–10.3)
CHLORIDE: 106 mmol/L (ref 98–110)
CO2: 25 mmol/L (ref 20–31)
Creat: 1.23 mg/dL (ref 0.60–1.35)
GFR, EST NON AFRICAN AMERICAN: 73 mL/min (ref >=60)
GFR, Est African American: 85 mL/min (ref >=60)
GLUCOSE: 108 mg/dL — AB (ref 65–99)
POTASSIUM: 4.5 mmol/L (ref 3.5–5.3)
SODIUM: 138 mmol/L (ref 135–146)
Total Bilirubin: 0.4 mg/dL (ref 0.2–1.2)
Total Protein: 7.1 g/dL (ref 6.1–8.1)

## 2016-06-05 LAB — T-HELPER CELL (CD4) - (RCID CLINIC ONLY)
CD4 T CELL ABS: 250 /uL — AB (ref 400–2700)
CD4 T CELL HELPER: 13 % — AB (ref 33–55)

## 2016-06-05 LAB — HIV-1 RNA QUANT-NO REFLEX-BLD

## 2016-06-10 ENCOUNTER — Encounter: Payer: Self-pay | Admitting: Infectious Disease

## 2016-07-09 ENCOUNTER — Encounter: Payer: Self-pay | Admitting: Infectious Disease

## 2016-07-09 ENCOUNTER — Ambulatory Visit (INDEPENDENT_AMBULATORY_CARE_PROVIDER_SITE_OTHER): Payer: Self-pay | Admitting: Infectious Disease

## 2016-07-09 VITALS — BP 131/91 | HR 67 | Temp 98.1°F | Ht 69.0 in | Wt 254.2 lb

## 2016-07-09 DIAGNOSIS — I10 Essential (primary) hypertension: Secondary | ICD-10-CM

## 2016-07-09 DIAGNOSIS — Z23 Encounter for immunization: Secondary | ICD-10-CM

## 2016-07-09 DIAGNOSIS — Z113 Encounter for screening for infections with a predominantly sexual mode of transmission: Secondary | ICD-10-CM

## 2016-07-09 DIAGNOSIS — B20 Human immunodeficiency virus [HIV] disease: Secondary | ICD-10-CM

## 2016-07-09 NOTE — Progress Notes (Signed)
Chief complaint: Follow-up for his HIV and AIDS  Subjective:    Patient ID: Walter [name], male    DOB: 1976-03-31, 40 y.o.   MRN: [id number]  HPI  40 year old [place] man diagnosed with HIV/AIDS this fall. [facility]  [facility] but apparently  ADAP had not initially processed. Marland Kitchen. He has been on TIVICAY and DESCOVY with nice suppression with most recent VL   He has gained significant weight with control of his HIV. He has renewed his ADAP  Lab Results  Component Value Date   HIV1RNAQUANT <20 06/04/2016   HIV1RNAQUANT 31 (H) 03/11/2016   HIV1RNAQUANT 35 (H) 12/24/2015      Lab Results  Component Value Date   CD4TABS 250 (L) 06/04/2016   CD4TABS 200 (L) 03/11/2016   CD4TABS 200 (L) 12/24/2015     Past Medical History:  Diagnosis Date  . Acute esophagitis 10/08/2015  . AIDS (HCC) 10/08/2015  . Screen for STD (sexually transmitted disease) 01/23/2016    No past surgical history on file.  Family History  Problem Relation Age of Onset  . Hypertension Mother       Social History   Social History  . Marital status: Single    Spouse name: N/A  . Number of children: N/A  . Years of education: N/A   Social History Main Topics  . Smoking status: Never Smoker  . Smokeless tobacco: Never Used  . Alcohol use 0.0 oz/week     Comment: Once a week.   . Drug use: No  . Sexual activity: Not Currently    Partners: Male     Comment: pt given condoms   Other Topics Concern  . None   Social History Narrative  . None    Allergies  Allergen Reactions  . Shrimp [Shellfish Allergy] Nausea And Vomiting     Current Outpatient Prescriptions:  .  dolutegravir (TIVICAY) 50 MG tablet, Take 1 tablet (50 mg total) by mouth daily., Disp: 30 tablet, Rfl: 11 .  emtricitabine-tenofovir AF (DESCOVY) 200-25 MG tablet, Take 1 tablet by mouth daily., Disp: 30 tablet, Rfl: 11 .  ibuprofen (ADVIL,MOTRIN) 200 MG tablet, Take 400 mg by mouth every 6 (six)  hours as needed for headache, mild pain or moderate pain. , Disp: , Rfl:  .  Multiple Vitamin (MULTIVITAMIN WITH MINERALS) TABS tablet, Take 1 tablet by mouth daily., Disp: , Rfl:    Review of Systems  Constitutional: Negative for activity change, appetite change, chills, diaphoresis, fatigue, fever and unexpected weight change.  HENT: Negative for congestion, rhinorrhea, sinus pressure, sneezing, sore throat and trouble swallowing.   Eyes: Negative for photophobia and visual disturbance.  Respiratory: Negative for cough, chest tightness, shortness of breath, wheezing and stridor.   Cardiovascular: Negative for chest pain, palpitations and leg swelling.  Gastrointestinal: Negative for abdominal distention, abdominal pain, anal bleeding, blood in stool, constipation, diarrhea, nausea and vomiting.  Genitourinary: Negative for difficulty urinating, dysuria, flank pain and hematuria.  Musculoskeletal: Negative for arthralgias, back pain, gait problem, joint swelling and myalgias.  Skin: Negative for color change, pallor, rash and wound.  Neurological: Negative for dizziness, tremors, weakness and light-headedness.  Hematological: Negative for adenopathy. Does not bruise/bleed easily.  Psychiatric/Behavioral: Negative for agitation, behavioral problems, confusion, decreased concentration, dysphoric mood and sleep disturbance.       Objective:   Physical Exam  Constitutional: He is oriented to person, place, and time. He appears well-developed and well-nourished.  HENT:  Head: Normocephalic and atraumatic.  Eyes: Conjunctivae and EOM are normal.  Neck: Normal range of motion. Neck supple.  Cardiovascular: Normal rate, regular rhythm and normal heart sounds.  Exam reveals no gallop and no friction rub.   No murmur heard. Pulmonary/Chest: Effort normal and breath sounds normal. No respiratory distress. He has no wheezes. He has no rales.  Abdominal: Soft. He exhibits no distension.    Musculoskeletal: Normal range of motion. He exhibits no edema or tenderness.  Neurological: He is alert and oriented to person, place, and time.  Skin: Skin is warm and dry. No rash noted. No erythema. No pallor.  Psychiatric: He has a normal mood and affect. His behavior is normal. Judgment and thought content normal.  Nursing note and vitals reviewed.         Assessment & Plan:   HIV/AIDS: continue TIVICAY and DESCOVY. DC bactrim and fluconazole   HTN:  Will likely need a BP med in future but he wants to lose weight or try to first Vitals:   07/09/16 0932  BP: (!) 150/94  Pulse: 62  Temp: 98.1 F (36.7 C)    Obesity: try low carb diet.  We spent greater than 25 minutes with the patient including greater than 50% of time in face to face counsel of the patient re his HIV, his obesity, HTN, supplements  and in coordination of his care.

## 2016-07-23 ENCOUNTER — Ambulatory Visit (INDEPENDENT_AMBULATORY_CARE_PROVIDER_SITE_OTHER): Payer: Self-pay | Admitting: *Deleted

## 2016-07-23 DIAGNOSIS — Z23 Encounter for immunization: Secondary | ICD-10-CM

## 2016-10-23 ENCOUNTER — Other Ambulatory Visit: Payer: Self-pay | Admitting: Infectious Disease

## 2016-11-19 ENCOUNTER — Other Ambulatory Visit: Payer: Self-pay | Admitting: Infectious Disease

## 2016-11-19 DIAGNOSIS — B20 Human immunodeficiency virus [HIV] disease: Secondary | ICD-10-CM

## 2016-12-10 ENCOUNTER — Ambulatory Visit: Payer: Self-pay | Admitting: Infectious Disease

## 2016-12-11 ENCOUNTER — Ambulatory Visit: Payer: Self-pay | Admitting: Infectious Disease

## 2016-12-12 ENCOUNTER — Other Ambulatory Visit: Payer: Self-pay | Admitting: Infectious Disease

## 2016-12-12 DIAGNOSIS — B20 Human immunodeficiency virus [HIV] disease: Secondary | ICD-10-CM

## 2016-12-25 ENCOUNTER — Ambulatory Visit: Payer: Self-pay

## 2016-12-30 ENCOUNTER — Encounter: Payer: Self-pay | Admitting: Infectious Disease

## 2017-01-07 ENCOUNTER — Other Ambulatory Visit: Payer: Self-pay

## 2017-01-07 DIAGNOSIS — B20 Human immunodeficiency virus [HIV] disease: Secondary | ICD-10-CM

## 2017-01-07 LAB — COMPLETE METABOLIC PANEL WITH GFR
ALT: 31 U/L (ref 9–46)
AST: 35 U/L (ref 10–40)
Albumin: 4.1 g/dL (ref 3.6–5.1)
Alkaline Phosphatase: 91 U/L (ref 40–115)
BILIRUBIN TOTAL: 0.4 mg/dL (ref 0.2–1.2)
BUN: 8 mg/dL (ref 7–25)
CHLORIDE: 104 mmol/L (ref 98–110)
CO2: 23 mmol/L (ref 20–31)
CREATININE: 1.25 mg/dL (ref 0.60–1.35)
Calcium: 9.2 mg/dL (ref 8.6–10.3)
GFR, Est African American: 83 mL/min (ref >=60)
GFR, Est Non African American: 72 mL/min (ref >=60)
GLUCOSE: 110 mg/dL — AB (ref 65–99)
Potassium: 4.2 mmol/L (ref 3.5–5.3)
SODIUM: 139 mmol/L (ref 135–146)
TOTAL PROTEIN: 7.2 g/dL (ref 6.1–8.1)

## 2017-01-07 LAB — CBC WITH DIFFERENTIAL/PLATELET
BASOS PCT: 1 %
Basophils Absolute: 41 cells/uL (ref 0–200)
EOS ABS: 82 {cells}/uL (ref 15–500)
EOS PCT: 2 %
HCT: 47.6 % (ref 38.5–50.0)
Hemoglobin: 16 g/dL (ref 13.2–17.1)
Lymphocytes Relative: 50 %
Lymphs Abs: 2050 cells/uL (ref 850–3900)
MCH: 32 pg (ref 27.0–33.0)
MCHC: 33.6 g/dL (ref 32.0–36.0)
MCV: 95.2 fL (ref 80.0–100.0)
MONOS PCT: 8 %
MPV: 9 fL (ref 7.5–12.5)
Monocytes Absolute: 328 cells/uL (ref 200–950)
NEUTROS ABS: 1599 {cells}/uL (ref 1500–7800)
Neutrophils Relative %: 39 %
PLATELETS: 247 10*3/uL (ref 140–400)
RBC: 5 MIL/uL (ref 4.20–5.80)
RDW: 13.8 % (ref 11.0–15.0)
WBC: 4.1 10*3/uL (ref 3.8–10.8)

## 2017-01-07 LAB — LIPID PANEL
CHOL/HDL RATIO: 4.8 ratio (ref <5.0)
Cholesterol: 186 mg/dL (ref <200)
HDL: 39 mg/dL — ABNORMAL LOW (ref >40)
LDL CALC: 114 mg/dL — AB (ref <100)
Triglycerides: 167 mg/dL — ABNORMAL HIGH (ref <150)
VLDL: 33 mg/dL — AB (ref <30)

## 2017-01-08 LAB — RPR

## 2017-01-08 LAB — URINE CYTOLOGY ANCILLARY ONLY
Chlamydia: NEGATIVE
Neisseria Gonorrhea: NEGATIVE

## 2017-01-08 LAB — T-HELPER CELL (CD4) - (RCID CLINIC ONLY)
CD4 T CELL HELPER: 16 % — AB (ref 33–55)
CD4 T Cell Abs: 370 /uL — ABNORMAL LOW (ref 400–2700)

## 2017-01-11 LAB — HIV RNA, RTPCR W/R GT (RTI, PI,INT)

## 2017-01-21 ENCOUNTER — Encounter: Payer: Self-pay | Admitting: Infectious Disease

## 2017-01-21 ENCOUNTER — Ambulatory Visit (INDEPENDENT_AMBULATORY_CARE_PROVIDER_SITE_OTHER): Payer: Self-pay | Admitting: Infectious Disease

## 2017-01-21 VITALS — BP 145/85 | HR 87 | Temp 97.8°F | Ht 69.5 in | Wt 267.0 lb

## 2017-01-21 DIAGNOSIS — Z23 Encounter for immunization: Secondary | ICD-10-CM

## 2017-01-21 DIAGNOSIS — Z113 Encounter for screening for infections with a predominantly sexual mode of transmission: Secondary | ICD-10-CM

## 2017-01-21 DIAGNOSIS — B20 Human immunodeficiency virus [HIV] disease: Secondary | ICD-10-CM

## 2017-01-21 DIAGNOSIS — I10 Essential (primary) hypertension: Secondary | ICD-10-CM

## 2017-01-21 MED ORDER — MENINGOCOCCAL A C Y&W-135 OLIG IM SOLR
0.5000 mL | Freq: Once | INTRAMUSCULAR | Status: AC
  Administered 2017-01-21: 0.5 mL via INTRAMUSCULAR

## 2017-01-21 NOTE — Progress Notes (Signed)
Chief complaint: Follow-up for his HIV and AIDS  Subjective:    Patient ID: Walter [name], male    DOB: 1976-08-10, 41 y.o.   MRN: [id number]  HPI  41 year old [place] man diagnosed with HIV/AIDS this fall. We had  enrolled him into [facility] but apparently  ADAP had not initially processed. Marland Kitchen He has been on TIVICAY and DESCOVY with nice suppression with most recent VL  He has renewed his ADAP. He is now 41 and eligible potentially for REPRIEVE.     Lab Results  Component Value Date   HIV1RNAQUANT <20 06/04/2016   HIV1RNAQUANT 31 (H) 03/11/2016   HIV1RNAQUANT 35 (H) 12/24/2015      Lab Results  Component Value Date   CD4TABS 370 (L) 01/07/2017   CD4TABS 250 (L) 06/04/2016   CD4TABS 200 (L) 03/11/2016     Past Medical History:  Diagnosis Date  . Acute esophagitis 10/08/2015  . AIDS (HCC) 10/08/2015  . Screen for STD (sexually transmitted disease) 01/23/2016    No past surgical history on file.  Family History  Problem Relation Age of Onset  . Hypertension Mother       Social History   Social History  . Marital status: Single    Spouse name: N/A  . Number of children: N/A  . Years of education: N/A   Social History Main Topics  . Smoking status: Never Smoker  . Smokeless tobacco: Never Used  . Alcohol use 0.0 oz/week     Comment: Once a week.   . Drug use: No  . Sexual activity: Not Currently    Partners: Male     Comment: pt given condoms   Other Topics Concern  . None   Social History Narrative  . None    Allergies  Allergen Reactions  . Shrimp [Shellfish Allergy] Nausea And Vomiting     Current Outpatient Prescriptions:  .  Cyanocobalamin (VITAMIN B 12 PO), Take 1,000 mcg by mouth daily., Disp: , Rfl:  .  DESCOVY 200-25 MG tablet, TAKE 1 TABLET BY MOUTH DAILY, Disp: 30 tablet, Rfl: 5 .  ibuprofen (ADVIL,MOTRIN) 200 MG tablet, Take 400 mg by mouth every 6 (six) hours as needed for headache, mild pain or moderate pain. ,  Disp: , Rfl:  .  Multiple Vitamin (MULTIVITAMIN WITH MINERALS) TABS tablet, Take 1 tablet by mouth daily., Disp: , Rfl:  .  TIVICAY 50 MG tablet, TAKE 1 TABLET BY MOUTH DAILY, Disp: 30 tablet, Rfl: 5   Review of Systems  Constitutional: Negative for activity change, appetite change, chills, diaphoresis, fatigue, fever and unexpected weight change.  HENT: Negative for congestion, rhinorrhea, sinus pressure, sneezing, sore throat and trouble swallowing.   Eyes: Negative for photophobia and visual disturbance.  Respiratory: Negative for cough, chest tightness, shortness of breath, wheezing and stridor.   Cardiovascular: Negative for chest pain, palpitations and leg swelling.  Gastrointestinal: Negative for abdominal distention, abdominal pain, anal bleeding, blood in stool, constipation, diarrhea, nausea and vomiting.  Genitourinary: Negative for difficulty urinating, dysuria, flank pain and hematuria.  Musculoskeletal: Negative for arthralgias, back pain, gait problem, joint swelling and myalgias.  Skin: Negative for color change, pallor, rash and wound.  Neurological: Negative for dizziness, tremors, weakness and light-headedness.  Hematological: Negative for adenopathy. Does not bruise/bleed easily.  Psychiatric/Behavioral: Negative for agitation, behavioral problems, confusion, decreased concentration, dysphoric mood and sleep disturbance.       Objective:   Physical Exam  Constitutional: He is oriented  to person, place, and time. He appears well-developed and well-nourished.  HENT:  Head: Normocephalic and atraumatic.  Eyes: Conjunctivae and EOM are normal.  Neck: Normal range of motion. Neck supple.  Cardiovascular: Normal rate, regular rhythm and normal heart sounds.  Exam reveals no gallop and no friction rub.   No murmur heard. Pulmonary/Chest: Effort normal and breath sounds normal. No respiratory distress. He has no wheezes. He has no rales.  Abdominal: Soft. He exhibits no  distension.  Musculoskeletal: Normal range of motion. He exhibits no edema or tenderness.  Neurological: He is alert and oriented to person, place, and time.  Skin: Skin is warm and dry. No rash noted. No erythema. No pallor.  Psychiatric: He has a normal mood and affect. His behavior is normal. Judgment and thought content normal.  Nursing note and vitals reviewed.         Assessment & Plan:   HIV/AIDS: continue TIVICAY and DESCOVY. I  Mentioned possibility of BIKTARVY as STR but he wishes to remain on his current regimen RTC in summer for ADAp renewal   HTN:  Will likely need a BP med in future  Vitals:   01/21/17 0839  BP: (!) 145/85  Pulse: 87  Temp: 97.8 F (36.6 C)    Obesity: try low carb diet.exercise  Cholesterol: consider for REPRIEVE  We spent greater than 25 minutes with the patient including greater than 50% of time in face to face counsel of the patient re his HIV, his obesity, HTN, supplements  and in coordination of his care.

## 2017-03-23 ENCOUNTER — Ambulatory Visit (INDEPENDENT_AMBULATORY_CARE_PROVIDER_SITE_OTHER): Payer: Self-pay | Admitting: *Deleted

## 2017-03-23 DIAGNOSIS — Z23 Encounter for immunization: Secondary | ICD-10-CM

## 2017-05-11 ENCOUNTER — Other Ambulatory Visit: Payer: Self-pay

## 2017-05-11 DIAGNOSIS — B20 Human immunodeficiency virus [HIV] disease: Secondary | ICD-10-CM

## 2017-05-11 LAB — CBC WITH DIFFERENTIAL/PLATELET
BASOS ABS: 42 {cells}/uL (ref 0–200)
BASOS PCT: 1 %
EOS ABS: 84 {cells}/uL (ref 15–500)
EOS PCT: 2 %
HCT: 45.8 % (ref 38.5–50.0)
HEMOGLOBIN: 15.3 g/dL (ref 13.2–17.1)
LYMPHS ABS: 2226 {cells}/uL (ref 850–3900)
Lymphocytes Relative: 53 %
MCH: 31.9 pg (ref 27.0–33.0)
MCHC: 33.4 g/dL (ref 32.0–36.0)
MCV: 95.6 fL (ref 80.0–100.0)
MPV: 9 fL (ref 7.5–12.5)
Monocytes Absolute: 336 cells/uL (ref 200–950)
Monocytes Relative: 8 %
NEUTROS ABS: 1512 {cells}/uL (ref 1500–7800)
Neutrophils Relative %: 36 %
Platelets: 239 10*3/uL (ref 140–400)
RBC: 4.79 MIL/uL (ref 4.20–5.80)
RDW: 13.8 % (ref 11.0–15.0)
WBC: 4.2 10*3/uL (ref 3.8–10.8)

## 2017-05-11 LAB — COMPLETE METABOLIC PANEL WITH GFR
ALBUMIN: 4.1 g/dL (ref 3.6–5.1)
ALK PHOS: 94 U/L (ref 40–115)
ALT: 27 U/L (ref 9–46)
AST: 29 U/L (ref 10–40)
BILIRUBIN TOTAL: 0.4 mg/dL (ref 0.2–1.2)
BUN: 14 mg/dL (ref 7–25)
CO2: 26 mmol/L (ref 20–31)
CREATININE: 1.3 mg/dL (ref 0.60–1.35)
Calcium: 9.1 mg/dL (ref 8.6–10.3)
Chloride: 104 mmol/L (ref 98–110)
GFR, EST NON AFRICAN AMERICAN: 68 mL/min (ref >=60)
GFR, Est African American: 79 mL/min (ref >=60)
GLUCOSE: 115 mg/dL — AB (ref 65–99)
Potassium: 4.4 mmol/L (ref 3.5–5.3)
SODIUM: 137 mmol/L (ref 135–146)
TOTAL PROTEIN: 7.4 g/dL (ref 6.1–8.1)

## 2017-05-11 LAB — LIPID PANEL
CHOLESTEROL: 186 mg/dL (ref <200)
HDL: 37 mg/dL — ABNORMAL LOW (ref >40)
LDL Cholesterol: 129 mg/dL — ABNORMAL HIGH (ref <100)
TRIGLYCERIDES: 102 mg/dL (ref <150)
Total CHOL/HDL Ratio: 5 Ratio — ABNORMAL HIGH (ref <5.0)
VLDL: 20 mg/dL (ref <30)

## 2017-05-12 LAB — T-HELPER CELL (CD4) - (RCID CLINIC ONLY)
CD4 T CELL HELPER: 18 % — AB (ref 33–55)
CD4 T Cell Abs: 420 /uL (ref 400–2700)

## 2017-05-12 LAB — RPR

## 2017-05-12 LAB — URINE CYTOLOGY ANCILLARY ONLY
Chlamydia: NEGATIVE
Neisseria Gonorrhea: NEGATIVE

## 2017-05-14 LAB — HIV-1 RNA QUANT-NO REFLEX-BLD
HIV 1 RNA QUANT: NOT DETECTED {copies}/mL
HIV-1 RNA QUANT, LOG: NOT DETECTED {Log_copies}/mL

## 2017-05-26 ENCOUNTER — Ambulatory Visit: Payer: Self-pay | Admitting: Infectious Disease

## 2017-05-26 ENCOUNTER — Ambulatory Visit: Payer: Self-pay

## 2017-05-29 ENCOUNTER — Encounter: Payer: Self-pay | Admitting: Infectious Disease

## 2017-05-29 ENCOUNTER — Ambulatory Visit: Payer: Self-pay

## 2017-05-29 ENCOUNTER — Other Ambulatory Visit: Payer: Self-pay | Admitting: Infectious Disease

## 2017-05-29 ENCOUNTER — Ambulatory Visit (INDEPENDENT_AMBULATORY_CARE_PROVIDER_SITE_OTHER): Payer: Self-pay | Admitting: Infectious Disease

## 2017-05-29 VITALS — BP 145/85 | HR 68 | Temp 98.6°F | Ht 69.5 in | Wt 275.0 lb

## 2017-05-29 DIAGNOSIS — I10 Essential (primary) hypertension: Secondary | ICD-10-CM

## 2017-05-29 DIAGNOSIS — Z113 Encounter for screening for infections with a predominantly sexual mode of transmission: Secondary | ICD-10-CM

## 2017-05-29 DIAGNOSIS — B20 Human immunodeficiency virus [HIV] disease: Secondary | ICD-10-CM

## 2017-05-29 MED ORDER — HYDROCHLOROTHIAZIDE 25 MG PO TABS: 25.0000 mg | ORAL_TABLET | Freq: Every day | ORAL | 11 refills | Status: DC

## 2017-05-29 NOTE — Progress Notes (Signed)
Chief complaint: Follow-up for his HIV and AIDS , c/o weight gain Subjective:    Patient ID: Walter [name], male    DOB: [birth year], 41 y.o.   MRN: [id number]  HPI  41 year old [place] man diagnosed with HIV/AIDS this fall. . He has been on TIVICAY and DESCOVY with nice suppression with most recent VL  He is doing very well. He is here for ADAP renewal, a bit frustrated by weight gain.      Lab Results  Component Value Date   HIV1RNAQUANT <20 NOT DETECTED 05/11/2017   HIV1RNAQUANT <20 06/04/2016   HIV1RNAQUANT 31 (H) 03/11/2016      Lab Results  Component Value Date   CD4TABS 420 05/11/2017   CD4TABS 370 (L) 01/07/2017   CD4TABS 250 (L) 06/04/2016     Past Medical History:  Diagnosis Date  . Acute esophagitis 10/08/2015  . AIDS (HCC) 10/08/2015  . Screen for STD (sexually transmitted disease) 01/23/2016    No past surgical history on file.  Family History  Problem Relation Age of Onset  . Hypertension Mother       Social History   Social History  . Marital status: Single    Spouse name: N/A  . Number of children: N/A  . Years of education: N/A   Social History Main Topics  . Smoking status: Never Smoker  . Smokeless tobacco: Never Used  . Alcohol use 0.0 oz/week     Comment: Once a week.   . Drug use: No  . Sexual activity: Not Currently    Partners: Male     Comment: pt given condoms   Other Topics Concern  . None   Social History Narrative  . None    Allergies  Allergen Reactions  . Shrimp [Shellfish Allergy] Nausea And Vomiting     Current Outpatient Prescriptions:  .  Cyanocobalamin (VITAMIN B 12 PO), Take 1,000 mcg by mouth daily., Disp: , Rfl:  .  DESCOVY 200-25 MG tablet, TAKE 1 TABLET BY MOUTH DAILY, Disp: 30 tablet, Rfl: 5 .  ibuprofen (ADVIL,MOTRIN) 200 MG tablet, Take 400 mg by mouth every 6 (six) hours as needed for headache, mild pain or moderate pain. , Disp: , Rfl:  .  Multiple Vitamin (MULTIVITAMIN WITH  MINERALS) TABS tablet, Take 1 tablet by mouth daily., Disp: , Rfl:  .  TIVICAY 50 MG tablet, TAKE 1 TABLET BY MOUTH DAILY, Disp: 30 tablet, Rfl: 5   Review of Systems  Constitutional: Positive for unexpected weight change. Negative for activity change, appetite change, chills, diaphoresis, fatigue and fever.  HENT: Negative for congestion, rhinorrhea, sinus pressure, sneezing, sore throat and trouble swallowing.   Eyes: Negative for photophobia and visual disturbance.  Respiratory: Negative for cough, chest tightness, shortness of breath, wheezing and stridor.   Cardiovascular: Negative for chest pain, palpitations and leg swelling.  Gastrointestinal: Negative for abdominal distention, abdominal pain, anal bleeding, blood in stool, constipation, diarrhea, nausea and vomiting.  Genitourinary: Negative for difficulty urinating, dysuria, flank pain and hematuria.  Musculoskeletal: Negative for arthralgias, back pain, gait problem, joint swelling and myalgias.  Skin: Negative for color change, pallor, rash and wound.  Neurological: Negative for dizziness, tremors, weakness and light-headedness.  Hematological: Negative for adenopathy. Does not bruise/bleed easily.  Psychiatric/Behavioral: Negative for agitation, behavioral problems, confusion, decreased concentration, dysphoric mood and sleep disturbance.       Objective:   Physical Exam  Constitutional: He is oriented to person, place, and time. He appears  well-developed and well-nourished.  HENT:  Head: Normocephalic and atraumatic.  Eyes: Conjunctivae and EOM are normal.  Neck: Normal range of motion. Neck supple.  Cardiovascular: Normal rate, regular rhythm and normal heart sounds.  Exam reveals no gallop and no friction rub.   No murmur heard. Pulmonary/Chest: Effort normal and breath sounds normal. No respiratory distress. He has no wheezes. He has no rales.  Abdominal: Soft. He exhibits no distension.  Musculoskeletal: Normal range  of motion. He exhibits no edema or tenderness.  Neurological: He is alert and oriented to person, place, and time.  Skin: Skin is warm and dry. No rash noted. No erythema. No pallor.  Psychiatric: He has a normal mood and affect. His behavior is normal. Judgment and thought content normal.  Nursing note and vitals reviewed.         Assessment & Plan:   HIV/AIDS: continue TIVICAY and DESCOVY. Renew ADAP and RTC in January for 6 month renewal  HTN:  Add HCTZ 25mg  and bring back for BP check with RN visit  Vitals:   05/29/17 0851  BP: (!) 145/85  Pulse: 68  Temp: 98.6 F (37 C)    Obesity, weight gain: try low carb diet.exercise  \  We spent greater than 25 minutes with the patient including greater than 50% of time in face to face counsel of the patient re his HIV, his obesity, HTN,  and in coordination of his care.

## 2017-05-29 NOTE — Patient Instructions (Signed)
Come back for RN BP check in one month on the HCTZ  GREAT JOB!

## 2017-06-02 ENCOUNTER — Encounter: Payer: Self-pay | Admitting: Infectious Disease

## 2017-06-29 ENCOUNTER — Ambulatory Visit: Payer: Self-pay

## 2017-08-06 ENCOUNTER — Ambulatory Visit (INDEPENDENT_AMBULATORY_CARE_PROVIDER_SITE_OTHER): Payer: Self-pay

## 2017-08-06 ENCOUNTER — Encounter: Payer: Self-pay | Admitting: *Deleted

## 2017-08-06 DIAGNOSIS — Z23 Encounter for immunization: Secondary | ICD-10-CM

## 2017-09-08 ENCOUNTER — Other Ambulatory Visit: Payer: Self-pay | Admitting: Infectious Disease

## 2017-09-08 DIAGNOSIS — B20 Human immunodeficiency virus [HIV] disease: Secondary | ICD-10-CM

## 2017-11-11 ENCOUNTER — Other Ambulatory Visit: Payer: Self-pay | Admitting: *Deleted

## 2017-11-11 ENCOUNTER — Telehealth: Payer: Self-pay | Admitting: *Deleted

## 2017-11-11 ENCOUNTER — Other Ambulatory Visit: Payer: Self-pay

## 2017-11-11 DIAGNOSIS — B20 Human immunodeficiency virus [HIV] disease: Secondary | ICD-10-CM

## 2017-11-11 MED ORDER — FLUCONAZOLE 100 MG PO TABS: 100.0000 mg | ORAL_TABLET | Freq: Every day | ORAL | 0 refills | Status: DC

## 2017-11-11 NOTE — Telephone Encounter (Signed)
Patient came in today to renew his ADAP and said that he recently had a cold and thinks he has thrush. He said he has had it before and Dr. Daiva EvesVan Dam gave him medication for it. Judeth CornfieldStephanie NP was going to examine him; but patient had to get to work and left before he could be seen. Please advise

## 2017-11-11 NOTE — Telephone Encounter (Signed)
Rx sent and patient stated he was surprised as well that he got thrush. Advised him to call us if he does not have improvement with this medication.

## 2017-11-11 NOTE — Telephone Encounter (Signed)
Thanks Annice PihJackie. I hope he is taking his ARV

## 2017-11-11 NOTE — Telephone Encounter (Signed)
He can have fluconazole for 2 weeks at 100mg  a day. I hope he doesn't have thrush though

## 2017-11-12 LAB — COMPLETE METABOLIC PANEL WITH GFR
AG RATIO: 1.5 (calc) (ref 1.0–2.5)
ALT: 36 U/L (ref 9–46)
AST: 35 U/L (ref 10–40)
Albumin: 4.4 g/dL (ref 3.6–5.1)
Alkaline phosphatase (APISO): 86 U/L (ref 40–115)
BUN: 12 mg/dL (ref 7–25)
CALCIUM: 9.6 mg/dL (ref 8.6–10.3)
CO2: 27 mmol/L (ref 20–32)
Chloride: 102 mmol/L (ref 98–110)
Creat: 1.16 mg/dL (ref 0.60–1.35)
GFR, EST AFRICAN AMERICAN: 91 mL/min/{1.73_m2} (ref >=60)
GFR, EST NON AFRICAN AMERICAN: 78 mL/min/{1.73_m2} (ref >=60)
GLOBULIN: 2.9 g/dL (ref 1.9–3.7)
Glucose, Bld: 123 mg/dL — ABNORMAL HIGH (ref 65–99)
Potassium: 4.2 mmol/L (ref 3.5–5.3)
SODIUM: 138 mmol/L (ref 135–146)
TOTAL PROTEIN: 7.3 g/dL (ref 6.1–8.1)
Total Bilirubin: 0.5 mg/dL (ref 0.2–1.2)

## 2017-11-12 LAB — LIPID PANEL
CHOL/HDL RATIO: 5 (calc) — AB (ref <5.0)
CHOLESTEROL: 209 mg/dL — AB (ref <200)
HDL: 42 mg/dL (ref >40)
LDL Cholesterol (Calc): 142 mg/dL (calc) — ABNORMAL HIGH
Non-HDL Cholesterol (Calc): 167 mg/dL (calc) — ABNORMAL HIGH (ref <130)
TRIGLYCERIDES: 127 mg/dL (ref <150)

## 2017-11-12 LAB — URINE CYTOLOGY ANCILLARY ONLY
CHLAMYDIA, DNA PROBE: NEGATIVE
NEISSERIA GONORRHEA: NEGATIVE

## 2017-11-12 LAB — CBC WITH DIFFERENTIAL/PLATELET
BASOS ABS: 42 {cells}/uL (ref 0–200)
Basophils Relative: 1 %
EOS ABS: 71 {cells}/uL (ref 15–500)
Eosinophils Relative: 1.7 %
HCT: 46.3 % (ref 38.5–50.0)
Hemoglobin: 16.1 g/dL (ref 13.2–17.1)
Lymphs Abs: 2079 cells/uL (ref 850–3900)
MCH: 31.4 pg (ref 27.0–33.0)
MCHC: 34.8 g/dL (ref 32.0–36.0)
MCV: 90.4 fL (ref 80.0–100.0)
MONOS PCT: 9.4 %
MPV: 9 fL (ref 7.5–12.5)
NEUTROS PCT: 38.4 %
Neutro Abs: 1613 cells/uL (ref 1500–7800)
PLATELETS: 292 10*3/uL (ref 140–400)
RBC: 5.12 10*6/uL (ref 4.20–5.80)
RDW: 13.1 % (ref 11.0–15.0)
TOTAL LYMPHOCYTE: 49.5 %
WBC mixed population: 395 cells/uL (ref 200–950)
WBC: 4.2 10*3/uL (ref 3.8–10.8)

## 2017-11-12 LAB — RPR: RPR: NONREACTIVE

## 2017-11-12 LAB — T-HELPER CELL (CD4) - (RCID CLINIC ONLY)
CD4 % Helper T Cell: 19 % — ABNORMAL LOW (ref 33–55)
CD4 T CELL ABS: 450 /uL (ref 400–2700)

## 2017-11-13 LAB — HIV-1 RNA QUANT-NO REFLEX-BLD
HIV 1 RNA Quant: <20 copies/mL
HIV-1 RNA Quant, Log: <1.3 Log copies/mL

## 2017-11-30 ENCOUNTER — Encounter: Payer: Self-pay | Admitting: Infectious Disease

## 2017-11-30 ENCOUNTER — Ambulatory Visit (INDEPENDENT_AMBULATORY_CARE_PROVIDER_SITE_OTHER): Payer: Self-pay | Admitting: Infectious Disease

## 2017-11-30 VITALS — BP 141/84 | HR 79 | Temp 98.1°F | Ht 69.5 in | Wt 268.0 lb

## 2017-11-30 DIAGNOSIS — K209 Esophagitis, unspecified without bleeding: Secondary | ICD-10-CM

## 2017-11-30 DIAGNOSIS — E7849 Other hyperlipidemia: Secondary | ICD-10-CM

## 2017-11-30 DIAGNOSIS — I10 Essential (primary) hypertension: Secondary | ICD-10-CM

## 2017-11-30 DIAGNOSIS — B20 Human immunodeficiency virus [HIV] disease: Secondary | ICD-10-CM

## 2017-11-30 DIAGNOSIS — R739 Hyperglycemia, unspecified: Secondary | ICD-10-CM

## 2017-11-30 DIAGNOSIS — Z23 Encounter for immunization: Secondary | ICD-10-CM

## 2017-11-30 DIAGNOSIS — Z113 Encounter for screening for infections with a predominantly sexual mode of transmission: Secondary | ICD-10-CM

## 2017-11-30 HISTORY — DX: Hyperglycemia, unspecified: R73.9

## 2017-11-30 NOTE — Progress Notes (Signed)
Chief complaint: Follow-up for his HIV and AIDS , had c/o of "thrush recently" Subjective:    Patient ID: Walter [name], male    DOB: [birth year], 42 y.o.   MRN: [id number]  HPI  42 year old African American man diagnosed with HIV/AIDS in 2017. He has been on TIVICAY and DESCOVY with nice suppression with most recent VL  He had gained weight which bothered him.     Lab Results  Component Value Date   HIV1RNAQUANT <20 NOT DETECTED 11/11/2017   HIV1RNAQUANT <20 NOT DETECTED 05/11/2017   HIV1RNAQUANT <20 06/04/2016      Lab Results  Component Value Date   CD4TABS 450 11/11/2017   CD4TABS 420 05/11/2017   CD4TABS 370 (L) 01/07/2017    He recently thought he had thrush and we gave him fluconazole though he was not examined and this would be unusual for him to have this with high CD4.  I noticed his BG was above 120 on possibly fasting CMP recently and LDL was elevated as well.   Past Medical History:  Diagnosis Date  . Acute esophagitis 10/08/2015  . AIDS (HCC) 10/08/2015  . Screen for STD (sexually transmitted disease) 01/23/2016    No past surgical history on file.  Family History  Problem Relation Age of Onset  . Hypertension Mother       Social History   Socioeconomic History  . Marital status: Single    Spouse name: Not on file  . Number of children: Not on file  . Years of education: Not on file  . Highest education level: Not on file  Social Needs  . Financial resource strain: Not on file  . Food insecurity - worry: Not on file  . Food insecurity - inability: Not on file  . Transportation needs - medical: Not on file  . Transportation needs - non-medical: Not on file  Occupational History  . Not on file  Tobacco Use  . Smoking status: Never Smoker  . Smokeless tobacco: Never Used  Substance and Sexual Activity  . Alcohol use: Yes    Alcohol/week: 0.0 oz    Comment: Once a week.   . Drug use: No  . Sexual activity: Not Currently   Partners: Male    Comment: pt given condoms  Other Topics Concern  . Not on file  Social History Narrative  . Not on file    Allergies  Allergen Reactions  . Shrimp [Shellfish Allergy] Nausea And Vomiting     Current Outpatient Medications:  .  Cyanocobalamin (VITAMIN B 12 PO), Take 1,000 mcg by mouth daily., Disp: , Rfl:  .  DESCOVY 200-25 MG tablet, TAKE 1 TABLET BY MOUTH DAILY, Disp: 30 tablet, Rfl: 5 .  fluconazole (DIFLUCAN) 100 MG tablet, Take 1 tablet (100 mg total) by mouth daily., Disp: 14 tablet, Rfl: 0 .  hydrochlorothiazide (HYDRODIURIL) 25 MG tablet, Take 1 tablet (25 mg total) by mouth daily., Disp: 30 tablet, Rfl: 11 .  ibuprofen (ADVIL,MOTRIN) 200 MG tablet, Take 400 mg by mouth every 6 (six) hours as needed for headache, mild pain or moderate pain. , Disp: , Rfl:  .  Multiple Vitamin (MULTIVITAMIN WITH MINERALS) TABS tablet, Take 1 tablet by mouth daily., Disp: , Rfl:  .  TIVICAY 50 MG tablet, TAKE 1 TABLET BY MOUTH DAILY, Disp: 30 tablet, Rfl: 5   Review of Systems  Constitutional: Negative for activity change, appetite change, chills, diaphoresis, fatigue and fever.  HENT: Positive for sore  throat. Negative for congestion, rhinorrhea, sinus pressure, sneezing and trouble swallowing.   Eyes: Negative for photophobia and visual disturbance.  Respiratory: Negative for cough, chest tightness, shortness of breath, wheezing and stridor.   Cardiovascular: Negative for chest pain, palpitations and leg swelling.  Gastrointestinal: Negative for abdominal distention, abdominal pain, anal bleeding, blood in stool, constipation, diarrhea, nausea and vomiting.  Genitourinary: Negative for difficulty urinating, dysuria, flank pain and hematuria.  Musculoskeletal: Negative for arthralgias, back pain, gait problem, joint swelling and myalgias.  Skin: Negative for color change, pallor, rash and wound.  Neurological: Negative for dizziness, tremors, weakness and light-headedness.    Hematological: Negative for adenopathy. Does not bruise/bleed easily.  Psychiatric/Behavioral: Negative for agitation, behavioral problems, confusion, decreased concentration, dysphoric mood and sleep disturbance.       Objective:   Physical Exam  Constitutional: He is oriented to person, place, and time. He appears well-developed and well-nourished.  HENT:  Head: Normocephalic and atraumatic.  Eyes: Conjunctivae and EOM are normal.  Neck: Normal range of motion. Neck supple.  Cardiovascular: Normal rate, regular rhythm and normal heart sounds. Exam reveals no gallop and no friction rub.  No murmur heard. Pulmonary/Chest: Effort normal and breath sounds normal. No respiratory distress. He has no wheezes. He has no rales.  Abdominal: Soft. He exhibits no distension.  Musculoskeletal: Normal range of motion. He exhibits no edema or tenderness.  Neurological: He is alert and oriented to person, place, and time.  Skin: Skin is warm and dry. No rash noted. No erythema. No pallor.  Psychiatric: He has a normal mood and affect. His behavior is normal. Judgment and thought content normal.  Nursing note and vitals reviewed.         Assessment & Plan:   HIV/AIDS: on TIVICAY and DESCOVY. Renew ADAP today. Consider change to BIKTARVY  Hyperglycemia: check A1c, if DM is dx will definitely make switch above, add metformin and add a statin  Hyperlipidemia: if he has DM then needs LDL <100   HTN:  Similarly BP goal will need to be more aggressive if diabetic There were no vitals filed for this visit.   I spent greater than 25 minutes with the patient including greater than 50% of time in face to face counsel of the patient re nature of how we diagnose DM and how type 2 DM is diagnosed and consequences, medications changes we will need to make to get out ahead of pathology with DM and in coordination of his care.

## 2017-12-01 ENCOUNTER — Telehealth: Payer: Self-pay | Admitting: Behavioral Health

## 2017-12-01 LAB — HEMOGLOBIN A1C
Hgb A1c MFr Bld: 5.8 % of total Hgb — ABNORMAL HIGH (ref <5.7)
Mean Plasma Glucose: 120 (calc)
eAG (mmol/L): 6.6 (calc)

## 2017-12-01 NOTE — Telephone Encounter (Signed)
-----   Message from Randall Hissornelius N Van Dam, MD sent at 12/01/2017  4:29 PM EST ----- Mr Eliot FordBriggs A1c was at 5.8 meaning he does NOT have Diabetes but should be careful re gaining weight

## 2017-12-02 ENCOUNTER — Encounter: Payer: Self-pay | Admitting: Infectious Disease

## 2017-12-02 NOTE — Progress Notes (Signed)
Patient informed his lab test indicated he does not have diabetes.

## 2018-02-15 ENCOUNTER — Telehealth: Payer: Self-pay | Admitting: *Deleted

## 2018-02-15 NOTE — Telephone Encounter (Signed)
Patient calling with sinus congestion x 3-4 days. He states he has clear nasal drainage and sneezing, pressure in his head and ears.  He started claritin 12 hour yesterday, notes some relief. He will try adding ibuprofen per the manufacturer instructions, cool mist humidifier.  What else can he add? Andree CossHowell, Jeydan Barner M, RN

## 2018-02-15 NOTE — Telephone Encounter (Signed)
oxymetolazone 2 sprays in each nostril at bedtime followed by 2 sprays of fluticasone in each nostril  He can try systemic sudafed (generic) if he does not suffer from high blood pressure

## 2018-02-19 NOTE — Telephone Encounter (Signed)
Left message asking how patient was feeling, asked him to call back if needed.

## 2018-02-22 ENCOUNTER — Other Ambulatory Visit: Payer: Self-pay | Admitting: Infectious Disease

## 2018-02-22 DIAGNOSIS — B20 Human immunodeficiency virus [HIV] disease: Secondary | ICD-10-CM

## 2018-02-22 NOTE — Telephone Encounter (Signed)
Thanks Michelle

## 2018-04-08 ENCOUNTER — Other Ambulatory Visit: Payer: Self-pay | Admitting: Infectious Disease

## 2018-04-08 DIAGNOSIS — B20 Human immunodeficiency virus [HIV] disease: Secondary | ICD-10-CM

## 2018-05-25 ENCOUNTER — Ambulatory Visit: Payer: Self-pay

## 2018-05-25 ENCOUNTER — Other Ambulatory Visit: Payer: Self-pay

## 2018-05-25 DIAGNOSIS — Z113 Encounter for screening for infections with a predominantly sexual mode of transmission: Secondary | ICD-10-CM

## 2018-05-25 DIAGNOSIS — B20 Human immunodeficiency virus [HIV] disease: Secondary | ICD-10-CM

## 2018-05-25 DIAGNOSIS — I10 Essential (primary) hypertension: Secondary | ICD-10-CM

## 2018-05-26 ENCOUNTER — Encounter: Payer: Self-pay | Admitting: Infectious Disease

## 2018-05-26 LAB — LIPID PANEL
Cholesterol: 196 mg/dL (ref <200)
HDL: 41 mg/dL (ref >40)
LDL CHOLESTEROL (CALC): 134 mg/dL — AB
NON-HDL CHOLESTEROL (CALC): 155 mg/dL — AB (ref <130)
TRIGLYCERIDES: 103 mg/dL (ref <150)
Total CHOL/HDL Ratio: 4.8 (calc) (ref <5.0)

## 2018-05-26 LAB — COMPLETE METABOLIC PANEL WITH GFR
AG Ratio: 1.7 (calc) (ref 1.0–2.5)
ALT: 29 U/L (ref 9–46)
AST: 32 U/L (ref 10–40)
Albumin: 4.4 g/dL (ref 3.6–5.1)
Alkaline phosphatase (APISO): 83 U/L (ref 40–115)
BUN: 12 mg/dL (ref 7–25)
CO2: 26 mmol/L (ref 20–32)
CREATININE: 1.26 mg/dL (ref 0.60–1.35)
Calcium: 9.4 mg/dL (ref 8.6–10.3)
Chloride: 104 mmol/L (ref 98–110)
GFR, EST AFRICAN AMERICAN: 82 mL/min/{1.73_m2} (ref >=60)
GFR, Est Non African American: 70 mL/min/{1.73_m2} (ref >=60)
GLUCOSE: 125 mg/dL — AB (ref 65–99)
Globulin: 2.6 g/dL (calc) (ref 1.9–3.7)
Potassium: 4.2 mmol/L (ref 3.5–5.3)
Sodium: 139 mmol/L (ref 135–146)
Total Bilirubin: 0.5 mg/dL (ref 0.2–1.2)
Total Protein: 7 g/dL (ref 6.1–8.1)

## 2018-05-26 LAB — MICROALBUMIN / CREATININE URINE RATIO
CREATININE, URINE: 89 mg/dL (ref 20–320)
Microalb Creat Ratio: 2 mcg/mg creat (ref <30)
Microalb, Ur: 0.2 mg/dL

## 2018-05-26 LAB — CBC WITH DIFFERENTIAL/PLATELET
BASOS ABS: 51 {cells}/uL (ref 0–200)
Basophils Relative: 1.3 %
EOS ABS: 90 {cells}/uL (ref 15–500)
EOS PCT: 2.3 %
HEMATOCRIT: 46.1 % (ref 38.5–50.0)
HEMOGLOBIN: 15.7 g/dL (ref 13.2–17.1)
Lymphs Abs: 2024 cells/uL (ref 850–3900)
MCH: 31.5 pg (ref 27.0–33.0)
MCHC: 34.1 g/dL (ref 32.0–36.0)
MCV: 92.4 fL (ref 80.0–100.0)
MPV: 9.2 fL (ref 7.5–12.5)
Monocytes Relative: 10.9 %
NEUTROS ABS: 1310 {cells}/uL — AB (ref 1500–7800)
Neutrophils Relative %: 33.6 %
Platelets: 258 10*3/uL (ref 140–400)
RBC: 4.99 10*6/uL (ref 4.20–5.80)
RDW: 13 % (ref 11.0–15.0)
Total Lymphocyte: 51.9 %
WBC: 3.9 10*3/uL (ref 3.8–10.8)
WBCMIX: 425 {cells}/uL (ref 200–950)

## 2018-05-26 LAB — URINE CYTOLOGY ANCILLARY ONLY
Chlamydia: NEGATIVE
Neisseria Gonorrhea: NEGATIVE

## 2018-05-26 LAB — T-HELPER CELL (CD4) - (RCID CLINIC ONLY)
CD4 T CELL ABS: 430 /uL (ref 400–2700)
CD4 T CELL HELPER: 20 % — AB (ref 33–55)

## 2018-05-26 LAB — RPR: RPR Ser Ql: NONREACTIVE

## 2018-05-28 LAB — HIV-1 RNA QUANT-NO REFLEX-BLD
HIV 1 RNA Quant: <20 copies/mL
HIV-1 RNA QUANT, LOG: NOT DETECTED {Log_copies}/mL

## 2018-06-01 ENCOUNTER — Encounter: Payer: Self-pay | Admitting: Infectious Disease

## 2018-06-01 ENCOUNTER — Ambulatory Visit (INDEPENDENT_AMBULATORY_CARE_PROVIDER_SITE_OTHER): Payer: Self-pay | Admitting: Infectious Disease

## 2018-06-01 VITALS — BP 151/81 | HR 71 | Temp 98.3°F | Wt 270.1 lb

## 2018-06-01 DIAGNOSIS — B20 Human immunodeficiency virus [HIV] disease: Secondary | ICD-10-CM

## 2018-06-01 DIAGNOSIS — Z23 Encounter for immunization: Secondary | ICD-10-CM

## 2018-06-01 DIAGNOSIS — Z113 Encounter for screening for infections with a predominantly sexual mode of transmission: Secondary | ICD-10-CM

## 2018-06-01 DIAGNOSIS — I10 Essential (primary) hypertension: Secondary | ICD-10-CM

## 2018-06-01 MED ORDER — HYDROCHLOROTHIAZIDE 25 MG PO TABS: ORAL_TABLET | ORAL | 11 refills | Status: DC

## 2018-06-01 MED ORDER — DOLUTEGRAVIR SODIUM 50 MG PO TABS: 50.0000 mg | ORAL_TABLET | Freq: Every day | ORAL | 11 refills | Status: DC

## 2018-06-01 MED ORDER — EMTRICITABINE-TENOFOVIR AF 200-25 MG PO TABS: 1.0000 | ORAL_TABLET | Freq: Every day | ORAL | 11 refills | Status: DC

## 2018-06-01 NOTE — Addendum Note (Signed)
Addended by: Lorenso CourierMALDONADO, JOSE L on: 06/01/2018 09:05 AM   Modules accepted: Orders

## 2018-06-01 NOTE — Progress Notes (Signed)
Chief complaint: Follow-up for his HIV and AIDS  Subjective:    Patient ID: Walter [name], male    DOB: January 17, 1976, 42 y.o.   MRN: [id number]  HPI  42 year old African American man diagnosed with HIV/AIDS in 2017. He has been on TIVICAY and DESCOVY with nice suppression with most recent VL  He continues to be highly adherent.     Lab Results  Component Value Date   HIV1RNAQUANT <20 NOT DETECTED 05/25/2018   HIV1RNAQUANT <20 NOT DETECTED 11/11/2017   HIV1RNAQUANT <20 NOT DETECTED 05/11/2017      Lab Results  Component Value Date   CD4TABS 430 05/25/2018   CD4TABS 450 11/11/2017   CD4TABS 420 05/11/2017     Past Medical History:  Diagnosis Date  . Acute esophagitis 10/08/2015  . AIDS (HCC) 10/08/2015  . Hyperglycemia 11/30/2017  . Screen for STD (sexually transmitted disease) 01/23/2016    No past surgical history on file.  Family History  Problem Relation Age of Onset  . Hypertension Mother       Social History   Socioeconomic History  . Marital status: Single    Spouse name: Not on file  . Number of children: Not on file  . Years of education: Not on file  . Highest education level: Not on file  Occupational History  . Not on file  Social Needs  . Financial resource strain: Not on file  . Food insecurity:    Worry: Not on file    Inability: Not on file  . Transportation needs:    Medical: Not on file    Non-medical: Not on file  Tobacco Use  . Smoking status: Never Smoker  . Smokeless tobacco: Never Used  Substance and Sexual Activity  . Alcohol use: Yes    Alcohol/week: 0.0 oz    Comment: Once a week.   . Drug use: No  . Sexual activity: Not Currently    Partners: Male    Comment: pt given condoms  Lifestyle  . Physical activity:    Days per week: Not on file    Minutes per session: Not on file  . Stress: Not on file  Relationships  . Social connections:    Talks on phone: Not on file    Gets together: Not on file    Attends  religious service: Not on file    Active member of club or organization: Not on file    Attends meetings of clubs or organizations: Not on file    Relationship status: Not on file  Other Topics Concern  . Not on file  Social History Narrative  . Not on file    Allergies  Allergen Reactions  . Shrimp [Shellfish Allergy] Nausea And Vomiting     Current Outpatient Medications:  .  Cyanocobalamin (VITAMIN B 12 PO), Take 1,000 mcg by mouth daily., Disp: , Rfl:  .  DESCOVY 200-25 MG tablet, TAKE 1 TABLET BY MOUTH DAILY, Disp: 30 tablet, Rfl: 5 .  fluconazole (DIFLUCAN) 100 MG tablet, Take 1 tablet (100 mg total) by mouth daily., Disp: 14 tablet, Rfl: 0 .  hydrochlorothiazide (HYDRODIURIL) 25 MG tablet, TAKE 1 TABLET(25 MG) BY MOUTH DAILY, Disp: 30 tablet, Rfl: 5 .  ibuprofen (ADVIL,MOTRIN) 200 MG tablet, Take 400 mg by mouth every 6 (six) hours as needed for headache, mild pain or moderate pain. , Disp: , Rfl:  .  Multiple Vitamin (MULTIVITAMIN WITH MINERALS) TABS tablet, Take 1 tablet by mouth daily.,  Disp: , Rfl:  .  TIVICAY 50 MG tablet, TAKE 1 TABLET BY MOUTH DAILY, Disp: 30 tablet, Rfl: 5   Review of Systems  Constitutional: Negative for activity change, appetite change, chills, diaphoresis, fatigue and fever.  HENT: Positive for sore throat. Negative for congestion, rhinorrhea, sinus pressure, sneezing and trouble swallowing.   Eyes: Negative for photophobia and visual disturbance.  Respiratory: Negative for cough, chest tightness, shortness of breath, wheezing and stridor.   Cardiovascular: Negative for chest pain, palpitations and leg swelling.  Gastrointestinal: Negative for abdominal distention, abdominal pain, anal bleeding, blood in stool, constipation, diarrhea, nausea and vomiting.  Genitourinary: Negative for difficulty urinating, dysuria, flank pain and hematuria.  Musculoskeletal: Negative for arthralgias, back pain, gait problem, joint swelling and myalgias.  Skin:  Negative for color change, pallor, rash and wound.  Neurological: Negative for dizziness, tremors, weakness and light-headedness.  Hematological: Negative for adenopathy. Does not bruise/bleed easily.  Psychiatric/Behavioral: Negative for agitation, behavioral problems, confusion, decreased concentration, dysphoric mood and sleep disturbance.       Objective:   Physical Exam  Constitutional: He is oriented to person, place, and time. He appears well-developed and well-nourished.  HENT:  Head: Normocephalic and atraumatic.  Eyes: Conjunctivae and EOM are normal.  Neck: Normal range of motion. Neck supple.  Cardiovascular: Normal rate, regular rhythm and normal heart sounds. Exam reveals no gallop and no friction rub.  No murmur heard. Pulmonary/Chest: Effort normal and breath sounds normal. No respiratory distress. He has no wheezes. He has no rales.  Abdominal: Soft. He exhibits no distension.  Musculoskeletal: Normal range of motion. He exhibits no edema or tenderness.  Neurological: He is alert and oriented to person, place, and time.  Skin: Skin is warm and dry. No rash noted. No erythema. No pallor.  Psychiatric: He has a normal mood and affect. His behavior is normal. Judgment and thought content normal.  Nursing note and vitals reviewed.         Assessment & Plan:   HIV/AIDS: on TIVICAY and DESCOVY. Renewed ADAP last week. He likes his current regimen. Vaccinate for prevnar and RTC in 6 months to renew HMAP agai  HTN:  Taking HCTZ. Recheck today. May need 2nd agent  Vitals:   06/01/18 0840  BP: (!) 152/84  Pulse: 78  Temp: 98.3 F (36.8 C)

## 2018-08-24 ENCOUNTER — Ambulatory Visit (INDEPENDENT_AMBULATORY_CARE_PROVIDER_SITE_OTHER): Payer: Self-pay

## 2018-08-24 DIAGNOSIS — Z23 Encounter for immunization: Secondary | ICD-10-CM

## 2018-11-26 ENCOUNTER — Ambulatory Visit: Payer: Self-pay | Admitting: Family Medicine

## 2018-11-29 ENCOUNTER — Other Ambulatory Visit: Payer: Self-pay

## 2018-11-29 DIAGNOSIS — B20 Human immunodeficiency virus [HIV] disease: Secondary | ICD-10-CM

## 2018-11-30 LAB — URINE CYTOLOGY ANCILLARY ONLY
Chlamydia: NEGATIVE
Neisseria Gonorrhea: NEGATIVE

## 2018-11-30 LAB — T-HELPER CELL (CD4) - (RCID CLINIC ONLY)
CD4 T CELL HELPER: 20 % — AB (ref 33–55)
CD4 T Cell Abs: 410 /uL (ref 400–2700)

## 2018-12-01 LAB — CBC WITH DIFFERENTIAL/PLATELET
ABSOLUTE MONOCYTES: 383 {cells}/uL (ref 200–950)
BASOS PCT: 1.2 %
Basophils Absolute: 52 cells/uL (ref 0–200)
EOS ABS: 60 {cells}/uL (ref 15–500)
Eosinophils Relative: 1.4 %
HCT: 48 % (ref 38.5–50.0)
HEMOGLOBIN: 16.3 g/dL (ref 13.2–17.1)
Lymphs Abs: 2094 cells/uL (ref 850–3900)
MCH: 31.7 pg (ref 27.0–33.0)
MCHC: 34 g/dL (ref 32.0–36.0)
MCV: 93.4 fL (ref 80.0–100.0)
MONOS PCT: 8.9 %
MPV: 9.4 fL (ref 7.5–12.5)
NEUTROS PCT: 39.8 %
Neutro Abs: 1711 cells/uL (ref 1500–7800)
PLATELETS: 296 10*3/uL (ref 140–400)
RBC: 5.14 10*6/uL (ref 4.20–5.80)
RDW: 12.9 % (ref 11.0–15.0)
TOTAL LYMPHOCYTE: 48.7 %
WBC: 4.3 10*3/uL (ref 3.8–10.8)

## 2018-12-01 LAB — LIPID PANEL
Cholesterol: 206 mg/dL — ABNORMAL HIGH (ref <200)
HDL: 47 mg/dL (ref >40)
LDL Cholesterol (Calc): 138 mg/dL (calc) — ABNORMAL HIGH
Non-HDL Cholesterol (Calc): 159 mg/dL (calc) — ABNORMAL HIGH (ref <130)
Total CHOL/HDL Ratio: 4.4 (calc) (ref <5.0)
Triglycerides: 107 mg/dL (ref <150)

## 2018-12-01 LAB — HIV-1 RNA QUANT-NO REFLEX-BLD
HIV 1 RNA Quant: <20 copies/mL
HIV-1 RNA QUANT, LOG: NOT DETECTED {Log_copies}/mL

## 2018-12-01 LAB — COMPLETE METABOLIC PANEL WITH GFR
AG RATIO: 1.5 (calc) (ref 1.0–2.5)
ALT: 27 U/L (ref 9–46)
AST: 28 U/L (ref 10–40)
Albumin: 4.6 g/dL (ref 3.6–5.1)
Alkaline phosphatase (APISO): 87 U/L (ref 40–115)
BUN: 10 mg/dL (ref 7–25)
CHLORIDE: 102 mmol/L (ref 98–110)
CO2: 29 mmol/L (ref 20–32)
CREATININE: 1.1 mg/dL (ref 0.60–1.35)
Calcium: 9.8 mg/dL (ref 8.6–10.3)
GFR, Est African American: 96 mL/min/{1.73_m2} (ref >=60)
GFR, Est Non African American: 83 mL/min/{1.73_m2} (ref >=60)
GLOBULIN: 3 g/dL (ref 1.9–3.7)
Glucose, Bld: 107 mg/dL — ABNORMAL HIGH (ref 65–99)
POTASSIUM: 4.3 mmol/L (ref 3.5–5.3)
SODIUM: 139 mmol/L (ref 135–146)
Total Bilirubin: 0.6 mg/dL (ref 0.2–1.2)
Total Protein: 7.6 g/dL (ref 6.1–8.1)

## 2018-12-01 LAB — RPR: RPR Ser Ql: NONREACTIVE

## 2018-12-08 ENCOUNTER — Ambulatory Visit (INDEPENDENT_AMBULATORY_CARE_PROVIDER_SITE_OTHER): Payer: Self-pay | Admitting: Family Medicine

## 2018-12-08 ENCOUNTER — Encounter: Payer: Self-pay | Admitting: Family Medicine

## 2018-12-08 VITALS — BP 136/78 | HR 74 | Temp 97.8°F | Ht 69.5 in | Wt 270.0 lb

## 2018-12-08 DIAGNOSIS — Z0000 Encounter for general adult medical examination without abnormal findings: Secondary | ICD-10-CM

## 2018-12-08 DIAGNOSIS — Z09 Encounter for follow-up examination after completed treatment for conditions other than malignant neoplasm: Secondary | ICD-10-CM

## 2018-12-08 DIAGNOSIS — E66812 Obesity, class 2: Secondary | ICD-10-CM

## 2018-12-08 DIAGNOSIS — R739 Hyperglycemia, unspecified: Secondary | ICD-10-CM

## 2018-12-08 DIAGNOSIS — Z6839 Body mass index (BMI) 39.0-39.9, adult: Secondary | ICD-10-CM

## 2018-12-08 DIAGNOSIS — J029 Acute pharyngitis, unspecified: Secondary | ICD-10-CM

## 2018-12-08 DIAGNOSIS — Z7689 Persons encountering health services in other specified circumstances: Secondary | ICD-10-CM

## 2018-12-08 LAB — POCT URINALYSIS DIP (MANUAL ENTRY)
Bilirubin, UA: NEGATIVE
Blood, UA: NEGATIVE
Glucose, UA: NEGATIVE mg/dL
Ketones, POC UA: NEGATIVE mg/dL
Leukocytes, UA: NEGATIVE
Nitrite, UA: NEGATIVE
Protein Ur, POC: NEGATIVE mg/dL
Spec Grav, UA: 1.02 (ref 1.010–1.025)
Urobilinogen, UA: 0.2 E.U./dL
pH, UA: 7.5 (ref 5.0–8.0)

## 2018-12-08 NOTE — Progress Notes (Signed)
New Patient--Establish Care  Subjective:    Patient ID: Walter [name], male    DOB: 10/30/1976, 43 y.o.   MRN: [id number]  Chief Complaint  Patient presents with  . Establish Care   HPI  Mr. Walter [name] is a 43 year old male with a past medical history of Hyperglycemia, and AIDS. He is here today to establish care.   Current Status: He is doing well with no complaints. He continues to follow up with Infection Disease. He has c/o throat irritation, cough, and pain X 5 days. He has not taken anything for relief. He denies fatigue, frequent urination, blurred vision, excessive hunger, excessive thirst, weight gain, weight loss, and poor wound healing. He continues to follow up with infection disease as needed.   He denies fevers, chills, fatigue, recent infections, weight loss, and night sweats. He has not had any headaches, visual changes, dizziness, and falls. No chest pain, heart palpitations, and shortness of breath reported. No reports of GI problems such as nausea, vomiting, diarrhea, and constipation. He has no reports of blood in stools, dysuria and hematuria. No depression or anxiety reported. He denies pain today.   Past Medical History:  Diagnosis Date  . Acute esophagitis 10/08/2015  . AIDS (HCC) 10/08/2015  . Hyperglycemia 11/30/2017  . Screen for STD (sexually transmitted disease) 01/23/2016    Social History   Socioeconomic History  . Marital status: Single    Spouse name: Not on file  . Number of children: Not on file  . Years of education: Not on file  . Highest education level: Not on file  Occupational History  . Not on file  Social Needs  . Financial resource strain: Not on file  . Food insecurity:    Worry: Not on file    Inability: Not on file  . Transportation needs:    Medical: Not on file    Non-medical: Not on file  Tobacco Use  . Smoking status: Never Smoker  . Smokeless tobacco: Never Used  Substance and Sexual Activity  . Alcohol use: Yes   Alcohol/week: 0.0 standard drinks    Comment: Once a week.   . Drug use: No  . Sexual activity: Not Currently    Partners: Male    Comment: pt given condoms  Lifestyle  . Physical activity:    Days per week: Not on file    Minutes per session: Not on file  . Stress: Not on file  Relationships  . Social connections:    Talks on phone: Not on file    Gets together: Not on file    Attends religious service: Not on file    Active member of club or organization: Not on file    Attends meetings of clubs or organizations: Not on file    Relationship status: Not on file  . Intimate partner violence:    Fear of current or ex partner: Not on file    Emotionally abused: Not on file    Physically abused: Not on file    Forced sexual activity: Not on file  Other Topics Concern  . Not on file  Social History Narrative  . Not on file   Family History  Problem Relation Age of Onset  . Hypertension Mother   . Diabetes Father    Allergies  Allergen Reactions  . Shrimp [Shellfish Allergy] Nausea And Vomiting     Review of Systems  Constitutional: Negative.   HENT: Negative.   Eyes: Negative.   Respiratory:  Positive for cough.   Cardiovascular: Negative.   Gastrointestinal: Negative.   Endocrine: Negative.   Genitourinary: Negative.   Musculoskeletal: Negative.   Skin: Negative.   Allergic/Immunologic: Negative.   Neurological: Negative.   Hematological: Negative.   Psychiatric/Behavioral: Negative.    Objective:   Physical Exam Vitals signs and nursing note reviewed.  Constitutional:      Appearance: Normal appearance. He is normal weight.  HENT:     Head: Normocephalic and atraumatic.     Right Ear: External ear normal.     Left Ear: External ear normal.     Nose: Nose normal.     Mouth/Throat:     Mouth: Mucous membranes are moist.     Pharynx: Oropharynx is clear.  Eyes:     Extraocular Movements: Extraocular movements intact.     Conjunctiva/sclera:  Conjunctivae normal.     Pupils: Pupils are equal, round, and reactive to light.  Neck:     Musculoskeletal: Normal range of motion and neck supple.  Cardiovascular:     Rate and Rhythm: Normal rate and regular rhythm.     Pulses: Normal pulses.     Heart sounds: Normal heart sounds.  Pulmonary:     Effort: Pulmonary effort is normal.     Breath sounds: Normal breath sounds.  Abdominal:     General: Bowel sounds are normal.     Palpations: Abdomen is soft.  Musculoskeletal: Normal range of motion.  Skin:    General: Skin is warm.     Capillary Refill: Capillary refill takes less than 2 seconds.  Neurological:     General: No focal deficit present.     Mental Status: He is alert and oriented to person, place, and time.  Psychiatric:        Mood and Affect: Mood normal.        Behavior: Behavior normal.        Thought Content: Thought content normal.        Judgment: Judgment normal.    Assessment & Plan:   1. Encounter to establish care - POCT urinalysis dipstick  2. Class 2 severe obesity due to excess calories with serious comorbidity and body mass index (BMI) of 39.0 to 39.9 in adult [facility]) Body mass index is 39.3 kg/m.  Goal BMI  is <30. Encouraged efforts to reduce weight include engaging in physical activity as tolerated with goal of 150 minutes per week. Improve dietary choices and eat a meal regimen consistent with a Mediterranean or DASH diet. Reduce simple carbohydrates. Do not skip meals and eat healthy snacks throughout the day to avoid over-eating at dinner. Set a goal weight loss that is achievable for you.  3. Hyperglycemia Hgb A1c is stable ay 5.8. He will continue to decrease foods/beverages high in sugars and carbs and follow Heart Healthy or DASH diet. Increase physical activity to at least 30 minutes cardio exercise daily.  - Hemoglobin A1c  4. Healthcare maintenance - Hemoglobin A1c - TSH - Lipid Panel  5. Sore throat Improving. He will drink warm  liquids, gargle with warm water, and use throat lozenges as directed.   6. Follow up She will follow up in 3 months.   No orders of the defined types were placed in this encounter.  [name],  MSN, FNP-C Patient Care [facility] [address] [place], [place] [address] [phone]

## 2018-12-08 NOTE — Patient Instructions (Signed)
Sore Throat  When you have a sore throat, your throat may feel:  · Tender.  · Burning.  · Irritated.  · Scratchy.  · Painful when you swallow.  · Painful when you talk.  Many things can cause a sore throat, such as:  · An infection.  · Allergies.  · Dry air.  · Smoke or pollution.  · Radiation treatment.  · Gastroesophageal reflux disease (GERD).  · A tumor.  A sore throat can be the first sign of another sickness. It can happen with other problems, like:  · Coughing.  · Sneezing.  · Fever.  · Swelling in the neck.  Most sore throats go away without treatment.  Follow these instructions at home:         · Take over-the-counter medicines only as told by your doctor.  ? If your child has a sore throat, do not give your child aspirin.  · Drink enough fluids to keep your pee (urine) pale yellow.  · Rest when you feel you need to.  · To help with pain:  ? Sip warm liquids, such as broth, herbal tea, or warm water.  ? Eat or drink cold or frozen liquids, such as frozen ice pops.  ? Gargle with a salt-water mixture 3-4 times a day or as needed. To make a salt-water mixture, add ½-1 tsp (3-6 g) of salt to 1 cup (237 mL) of warm water. Mix it until you cannot see the salt anymore.  ? Suck on hard candy or throat lozenges.  ? Put a cool-mist humidifier in your bedroom at night.  ? Sit in the bathroom with the door closed for 5-10 minutes while you run hot water in the shower.  · Do not use any products that contain nicotine or tobacco, such as cigarettes, e-cigarettes, and chewing tobacco. If you need help quitting, ask your doctor.  · Wash your hands well and often with soap and water. If soap and water are not available, use hand sanitizer.  Contact a doctor if:  · You have a fever for more than 2-3 days.  · You keep having symptoms for more than 2-3 days.  · Your throat does not get better in 7 days.  · You have a fever and your symptoms suddenly get worse.  · Your child who is 3 months to 3 years old has a temperature of  102.2°F (39°C) or higher.  Get help right away if:  · You have trouble breathing.  · You cannot swallow fluids, soft foods, or your saliva.  · You have swelling in your throat or neck that gets worse.  · You keep feeling sick to your stomach (nauseous).  · You keep throwing up (vomiting).  Summary  · A sore throat is pain, burning, irritation, or scratchiness in the throat. Many things can cause a sore throat.  · Take over-the-counter medicines only as told by your doctor. Do not give your child aspirin.  · Drink plenty of fluids, and rest as needed.  · Contact a doctor if your symptoms get worse or your sore throat does not get better within 7 days.  This information is not intended to replace advice given to you by your health care provider. Make sure you discuss any questions you have with your health care provider.  Document Released: 08/19/2008 Document Revised: 04/12/2018 Document Reviewed: 04/12/2018  [employer]vier Interactive Patient Education © 2019 [employer]

## 2018-12-09 LAB — HEMOGLOBIN A1C
Est. average glucose Bld gHb Est-mCnc: 120 mg/dL
Hgb A1c MFr Bld: 5.8 % — ABNORMAL HIGH (ref 4.8–5.6)

## 2018-12-09 LAB — LIPID PANEL
Chol/HDL Ratio: 4.7 ratio (ref 0.0–5.0)
Cholesterol, Total: 192 mg/dL (ref 100–199)
HDL: 41 mg/dL (ref >39)
LDL Calculated: 135 mg/dL — ABNORMAL HIGH (ref 0–99)
Triglycerides: 80 mg/dL (ref 0–149)
VLDL Cholesterol Cal: 16 mg/dL (ref 5–40)

## 2018-12-09 LAB — TSH: TSH: 1.37 u[IU]/mL (ref 0.450–4.500)

## 2018-12-14 ENCOUNTER — Ambulatory Visit (INDEPENDENT_AMBULATORY_CARE_PROVIDER_SITE_OTHER): Payer: Self-pay | Admitting: Infectious Disease

## 2018-12-14 ENCOUNTER — Encounter: Payer: Self-pay | Admitting: Infectious Disease

## 2018-12-14 VITALS — BP 133/85 | HR 67 | Temp 98.5°F | Wt 269.0 lb

## 2018-12-14 DIAGNOSIS — Z113 Encounter for screening for infections with a predominantly sexual mode of transmission: Secondary | ICD-10-CM

## 2018-12-14 DIAGNOSIS — R635 Abnormal weight gain: Secondary | ICD-10-CM | POA: Insufficient documentation

## 2018-12-14 DIAGNOSIS — I10 Essential (primary) hypertension: Secondary | ICD-10-CM

## 2018-12-14 DIAGNOSIS — B20 Human immunodeficiency virus [HIV] disease: Secondary | ICD-10-CM

## 2018-12-14 HISTORY — DX: Abnormal weight gain: R63.5

## 2018-12-14 NOTE — Progress Notes (Signed)
Chief complaint: Follow-up for his HIV and AIDS  Subjective:    Patient ID: Walter [name], male    DOB: 01-04-1976, 43 y.o.   MRN: [id number]  HPI  43 year old African American man diagnosed with HIV/AIDS in 2017. He has been on TIVICAY and DESCOVY with nice suppression with most recent VL  He continues to be highly adherent.  Also is noticed that he gained 70 pounds since initiating antiretroviral medication.  His weight is relatively been stable recently but certainly of massive increase in weight from the baseline in the 180s when he first got medications.  On review of his several last several years he told me that he did have a massive increase in his appetite the first few weeks on his antiretrovirals.  He is going to start to try a low carbohydrate diet.  I also thought he should consider the AIDS clinical trial group study that involves switch from INSTI to DOR plus Descovy vs Truvada.       Past Medical History:  Diagnosis Date  . Acute esophagitis 10/08/2015  . AIDS (HCC) 10/08/2015  . Hyperglycemia 11/30/2017  . Screen for STD (sexually transmitted disease) 01/23/2016    No past surgical history on file.  Family History  Problem Relation Age of Onset  . Hypertension Mother   . Diabetes Father       Social History   Socioeconomic History  . Marital status: Single    Spouse name: Not on file  . Number of children: Not on file  . Years of education: Not on file  . Highest education level: Not on file  Occupational History  . Not on file  Social Needs  . Financial resource strain: Not on file  . Food insecurity:    Worry: Not on file    Inability: Not on file  . Transportation needs:    Medical: Not on file    Non-medical: Not on file  Tobacco Use  . Smoking status: Never Smoker  . Smokeless tobacco: Never Used  Substance and Sexual Activity  . Alcohol use: Yes    Alcohol/week: 0.0 standard drinks    Comment: Once a week.   . Drug use: No  .  Sexual activity: Not Currently    Partners: Male    Comment: pt given condoms  Lifestyle  . Physical activity:    Days per week: Not on file    Minutes per session: Not on file  . Stress: Not on file  Relationships  . Social connections:    Talks on phone: Not on file    Gets together: Not on file    Attends religious service: Not on file    Active member of club or organization: Not on file    Attends meetings of clubs or organizations: Not on file    Relationship status: Not on file  Other Topics Concern  . Not on file  Social History Narrative  . Not on file    Allergies  Allergen Reactions  . Shrimp [Shellfish Allergy] Nausea And Vomiting     Current Outpatient Medications:  .  Cyanocobalamin (VITAMIN B 12 PO), Take 1,000 mcg by mouth daily., Disp: , Rfl:  .  dolutegravir (TIVICAY) 50 MG tablet, Take 1 tablet (50 mg total) by mouth daily., Disp: 30 tablet, Rfl: 11 .  emtricitabine-tenofovir AF (DESCOVY) 200-25 MG tablet, Take 1 tablet by mouth daily., Disp: 30 tablet, Rfl: 11 .  hydrochlorothiazide (HYDRODIURIL) 25 MG tablet,  TAKE 1 TABLET(25 MG) BY MOUTH DAILY, Disp: 30 tablet, Rfl: 11 .  ibuprofen (ADVIL,MOTRIN) 200 MG tablet, Take 400 mg by mouth every 6 (six) hours as needed for headache, mild pain or moderate pain. , Disp: , Rfl:  .  Multiple Vitamin (MULTIVITAMIN WITH MINERALS) TABS tablet, Take 1 tablet by mouth daily., Disp: , Rfl:    Review of Systems  Constitutional: Negative for activity change, appetite change, chills, diaphoresis, fatigue and fever.  HENT: Negative for congestion, rhinorrhea, sinus pressure, sneezing, sore throat and trouble swallowing.   Eyes: Negative for photophobia and visual disturbance.  Respiratory: Negative for cough, chest tightness, shortness of breath, wheezing and stridor.   Cardiovascular: Negative for chest pain, palpitations and leg swelling.  Gastrointestinal: Negative for abdominal distention, abdominal pain, anal bleeding,  blood in stool, constipation, diarrhea, nausea and vomiting.  Genitourinary: Negative for difficulty urinating, dysuria, flank pain and hematuria.  Musculoskeletal: Negative for arthralgias, back pain, gait problem, joint swelling and myalgias.  Skin: Negative for color change, pallor, rash and wound.  Neurological: Negative for dizziness, tremors, weakness and light-headedness.  Hematological: Negative for adenopathy. Does not bruise/bleed easily.  Psychiatric/Behavioral: Negative for agitation, behavioral problems, confusion, decreased concentration, dysphoric mood and sleep disturbance.       Objective:   Physical Exam  Constitutional: He is oriented to person, place, and time. He appears well-developed and well-nourished.  HENT:  Head: Normocephalic and atraumatic.  Eyes: Conjunctivae and EOM are normal.  Neck: Normal range of motion. Neck supple.  Cardiovascular: Normal rate, regular rhythm and normal heart sounds. Exam reveals no gallop and no friction rub.  No murmur heard. Pulmonary/Chest: Effort normal and breath sounds normal. No respiratory distress. He has no wheezes. He has no rales.  Abdominal: Soft. He exhibits no distension.  Musculoskeletal: Normal range of motion.        General: No tenderness, deformity or edema.  Neurological: He is alert and oriented to person, place, and time.  Skin: Skin is warm and dry. No rash noted. No erythema. No pallor.  Psychiatric: He has a normal mood and affect. His behavior is normal. Judgment and thought content normal.  Nursing note and vitals reviewed.         Assessment & Plan:   HIV/AIDS: on TIVICAY and DESCOVY. Renewed ADAP his labs he will come back in July did renew the HIV medication assistance program.  I do think he should consider the ACG trial evolving switch to DOR and DESCOVY versus Truvada  HTN:  Taking HCTZ.   Weight gain: Could be multifactorial but certainly with 70 pounds of weight gain on a integrates  inhibitor it certainly could have been due to his integrates strand transfer inhibitor and also potentially due to TAF  I spent greater than 25 minutes with the patient including greater than 50% of time in face to face counsel of the patient the nature of clinical trials, regarding the antiretrovirals will be involved in this trial regarding the current data that is being investigated as far as weight gain and antegrade strand transfer inhibitors and TAF  and in coordination of his care.  There were no vitals filed for this visit.

## 2019-02-23 DIAGNOSIS — R7303 Prediabetes: Secondary | ICD-10-CM

## 2019-02-23 HISTORY — DX: Prediabetes: R73.03

## 2019-03-09 ENCOUNTER — Telehealth: Payer: Self-pay | Admitting: Family Medicine

## 2019-03-09 ENCOUNTER — Other Ambulatory Visit: Payer: Self-pay

## 2019-03-09 ENCOUNTER — Ambulatory Visit: Payer: Self-pay | Admitting: Family Medicine

## 2019-03-09 NOTE — Telephone Encounter (Signed)
Multiple attempts to contact patient for Telephone Virtual Visit today. Message left on voicemail to call office.

## 2019-03-16 ENCOUNTER — Ambulatory Visit (INDEPENDENT_AMBULATORY_CARE_PROVIDER_SITE_OTHER): Payer: Self-pay | Admitting: Family Medicine

## 2019-03-16 ENCOUNTER — Encounter: Payer: Self-pay | Admitting: Family Medicine

## 2019-03-16 ENCOUNTER — Other Ambulatory Visit: Payer: Self-pay

## 2019-03-16 DIAGNOSIS — E66812 Obesity, class 2: Secondary | ICD-10-CM

## 2019-03-16 DIAGNOSIS — R739 Hyperglycemia, unspecified: Secondary | ICD-10-CM

## 2019-03-16 DIAGNOSIS — J302 Other seasonal allergic rhinitis: Secondary | ICD-10-CM

## 2019-03-16 DIAGNOSIS — Z6839 Body mass index (BMI) 39.0-39.9, adult: Secondary | ICD-10-CM

## 2019-03-16 DIAGNOSIS — I10 Essential (primary) hypertension: Secondary | ICD-10-CM

## 2019-03-16 MED ORDER — SALINE SPRAY 0.65 % NA SOLN: 1.0000 | NASAL | 6 refills | Status: DC | PRN

## 2019-03-16 MED ORDER — LEVOCETIRIZINE DIHYDROCHLORIDE 5 MG PO TABS: 5.0000 mg | ORAL_TABLET | Freq: Every evening | ORAL | 6 refills | Status: DC

## 2019-03-16 MED ORDER — FLUTICASONE PROPIONATE 50 MCG/ACT NA SUSP: 2.0000 | Freq: Every day | NASAL | 6 refills | Status: DC

## 2019-03-16 NOTE — Progress Notes (Signed)
Virtual Visit via Telephone Note  I connected with Inda Merlinharles E Goonan on 03/16/19 at  9:00 AM EDT by telephone and verified that I am speaking with the correct person using two identifiers.   I discussed the limitations, risks, security and privacy concerns of performing an evaluation and management service by telephone and the availability of in person appointments. I also discussed with the patient that there may be a patient responsible charge related to this service. The patient expressed understanding and agreed to proceed.   History of Present Illness:  Past Medical History:  Diagnosis Date  . Acute esophagitis 10/08/2015  . AIDS (HCC) 10/08/2015  . Hyperglycemia 11/30/2017  . Screen for STD (sexually transmitted disease) 01/23/2016  . Seasonal allergies   . Weight gain 12/14/2018    Current Outpatient Medications on File Prior to Visit  Medication Sig Dispense Refill  . Cyanocobalamin (VITAMIN B 12 PO) Take 1,000 mcg by mouth daily.    . dolutegravir (TIVICAY) 50 MG tablet Take 1 tablet (50 mg total) by mouth daily. 30 tablet 11  . hydrochlorothiazide (HYDRODIURIL) 25 MG tablet TAKE 1 TABLET(25 MG) BY MOUTH DAILY 30 tablet 11  . Multiple Vitamin (MULTIVITAMIN WITH MINERALS) TABS tablet Take 1 tablet by mouth daily.    Marland Kitchen. emtricitabine-tenofovir AF (DESCOVY) 200-25 MG tablet Take 1 tablet by mouth daily. 30 tablet 11  . ibuprofen (ADVIL,MOTRIN) 200 MG tablet Take 400 mg by mouth every 6 (six) hours as needed for headache, mild pain or moderate pain.     . [DISCONTINUED] diphenhydrAMINE (BENADRYL) 25 MG tablet Take 1 tablet (25 mg total) by mouth every 6 (six) hours. Take for 3 days then as needed for recurrent rash (Patient not taking: Reported on 10/04/2015) 20 tablet 0   No current facility-administered medications on file prior to visit.     Current Status: Since his last office visit, he is doing well with no complaints.  He has been doing 'lifestyle' changes and eating a  healthier diet X 2 months now. He has not weighed himself lately, but he can feel that his clothes are fitting him looser. He has been having increasing symptoms of seasonal allergies within the last week or so. He states that he did receive allergy shots in the past. He continues to follow up with Infection Disease as needed.   He denies fevers, chills, fatigue, recent infections, weight loss, and night sweats. He has not had any headaches, visual changes, dizziness, and falls. No chest pain, heart palpitations, cough and shortness of breath reported. No reports of GI problems such as nausea, vomiting, diarrhea, and constipation. He has no reports of blood in stools, dysuria and hematuria. No depression or anxiety reported. He denies pain today.   Observations/Objective:  Telephone Visual Visit   Assessment and Plan:  1. Class 2 severe obesity due to excess calories with serious comorbidity and body mass index (BMI) of 39.0 to 39.9 in adult (HCC) Goal BMI  is <30. Encouraged efforts to reduce weight include engaging in physical activity as tolerated with goal of 150 minutes per week. Improve dietary choices and eat a meal regimen consistent with a Mediterranean or DASH diet. Reduce simple carbohydrates. Do not skip meals and eat healthy snacks throughout the day to avoid over-eating at dinner. Set a goal weight loss that is achievable for you.  2. Seasonal allergies We will initiate Xyzal today.  - levocetirizine (XYZAL) 5 MG tablet; Take 1 tablet (5 mg total) by mouth every evening.  Dispense: 30 tablet; Refill: 6 - fluticasone (FLONASE) 50 MCG/ACT nasal spray; Place 2 sprays into both nostrils daily.  Dispense: 16 g; Refill: 6 - sodium chloride (OCEAN) 0.65 % SOLN nasal spray; Place 1 spray into both nostrils as needed for congestion.  Dispense: 15 mL; Refill: 6  3. Essential hypertension Continue HCTZ as prescribed. He will continue to decrease high sodium intake, excessive alcohol intake,  increase potassium intake, smoking cessation, and increase physical activity of at least 30 minutes of cardio activity daily. She will continue to follow Heart Healthy or DASH diet.  4. Hyperglycemia  Meds ordered this encounter  Medications  . levocetirizine (XYZAL) 5 MG tablet    Sig: Take 1 tablet (5 mg total) by mouth every evening.    Dispense:  30 tablet    Refill:  6  . fluticasone (FLONASE) 50 MCG/ACT nasal spray    Sig: Place 2 sprays into both nostrils daily.    Dispense:  16 g    Refill:  6  . sodium chloride (OCEAN) 0.65 % SOLN nasal spray    Sig: Place 1 spray into both nostrils as needed for congestion.    Dispense:  15 mL    Refill:  6    No orders of the defined types were placed in this encounter.   Referral Orders  No referral(s) requested today    [name],  MSN, FNP-C [facility] [address] [place], [place] [address] [phone]    Follow Up Instructions:  He will follow up in 6 months.   I discussed the assessment and treatment plan with the patient. The patient was provided an opportunity to ask questions and all were answered. The patient agreed with the plan and demonstrated an understanding of the instructions.   The patient was advised to call back or seek an in-person evaluation if the symptoms worsen or if the condition fails to improve as anticipated.  I provided 20 minutes of non-face-to-face time during this encounter.   Kallie Locks, FNP

## 2019-03-16 NOTE — Patient Instructions (Signed)
Fluticasone nasal spray What is this medicine? FLUTICASONE (floo TIK a sone) is a corticosteroid. This medicine is used to treat the symptoms of allergies like sneezing, itchy red eyes, and itchy, runny, or stuffy nose. This medicine is also used to treat nasal polyps. This medicine may be used for other purposes; ask your health care provider or pharmacist if you have questions. COMMON BRAND NAME(S): ClariSpray, Flonase, Flonase Allergy Relief, Flonase Sensimist, Veramyst, XHANCE What should I tell my health care provider before I take this medicine? They need to know if you have any of these conditions: -eye disease, vision problems -infection, like tuberculosis, herpes, or fungal infection -recent surgery on nose or sinuses -taking a corticosteroid by mouth -an unusual or allergic reaction to fluticasone, steroids, other medicines, foods, dyes, or preservatives -pregnant or trying to get pregnant -breast-feeding How should I use this medicine? This medicine is for use in the nose. Follow the directions on your product or prescription label. This medicine works best if used at regular intervals. Do not use more often than directed. Make sure that you are using your nasal spray correctly. After 6 months of daily use for allergies, talk to your doctor or health care professional before using it for a longer time. Ask your doctor or health care professional if you have any questions. Talk to your pediatrician regarding the use of this medicine in children. Special care may be needed. Some products have been used for allergies in children as young as 2 years. After 2 months of daily use without a prescription in a child, talk to your pediatrician before using it for a longer time. Use of this medicine for nasal polyps is not approved in children. Overdosage: If you think you have taken too much of this medicine contact a poison control center or emergency room at once. NOTE: This medicine is only for  you. Do not share this medicine with others. What if I miss a dose? If you miss a dose, use it as soon as you remember. If it is almost time for your next dose, use only that dose and continue with your regular schedule. Do not use double or extra doses. What may interact with this medicine? -certain antibiotics like clarithromycin and telithromycin -certain medicines for fungal infections like ketoconazole, itraconazole, and voriconazole -conivaptan -nefazodone -some medicines for HIV -vaccines This list may not describe all possible interactions. Give your health care provider a list of all the medicines, herbs, non-prescription drugs, or dietary supplements you use. Also tell them if you smoke, drink alcohol, or use illegal drugs. Some items may interact with your medicine. What should I watch for while using this medicine? Visit your healthcare professional for regular checks on your progress. Tell your healthcare professional if your symptoms do not start to get better or if they get worse. This medicine may increase your risk of getting an infection. Tell your doctor or health care professional if you are around anyone with measles or chickenpox, or if you develop sores or blisters that do not heal properly. What side effects may I notice from receiving this medicine? Side effects that you should report to your doctor or health care professional as soon as possible: -allergic reactions like skin rash, itching or hives, swelling of the face, lips, or tongue -changes in vision -crusting or sores in the nose -nosebleed -signs and symptoms of infection like fever or chills; cough; sore throat -white patches or sores in the mouth or nose Side effects that  usually do not require medical attention (report to your doctor or health care professional if they continue or are bothersome): -burning or irritation inside the nose or throat -changes in taste or smell -cough -headache This list may  not describe all possible side effects. Call your doctor for medical advice about side effects. You may report side effects to FDA at 1-800-FDA-1088. Where should I keep my medicine? Keep out of the reach of children. Store at room temperature between 15 and 30 degrees C (59 and 86 degrees F). Avoid exposure to extreme heat, cold, or light. Throw away any unused medicine after the expiration date. NOTE: This sheet is a summary. It may not cover all possible information. If you have questions about this medicine, talk to your doctor, pharmacist, or health care provider.  2019 Elsevier/Gold Standard (2017-12-03 14:10:08)    Levocetirizine Oral Tablets What is this medicine? LEVOCETIRIZINE (LEE voe se TIR i zeen) is an antihistamine. This medicine is used to treat or prevent symptoms of allergies. It is also used to help reduce itchy skin rash and hives. This medicine may be used for other purposes; ask your health care provider or pharmacist if you have questions. COMMON BRAND NAME(S): Xyzal, Xyzal Allergy 24 Hour What should I tell my health care provider before I take this medicine? They need to know if you have any of these conditions: -kidney disease -an unusual or allergic reaction to levocetirizine, cetirizine, hydroxyzine, other medicines, foods, dyes, or preservatives -pregnant or trying to get pregnant -breast-feeding How should I use this medicine? Take this medicine by mouth with a glass of water. Take it at night. The tablet may be split in half. Do not chew the tablets. Follow the directions on the prescription label. You can take it with or without food. Do not take more medicine than directed. You may need to take this medicine for several days before your symptoms improve. Talk to your pediatrician regarding the use of this medicine in children. While this drug may be prescribed for children as young as 42 years old for selected conditions, precautions do apply. Overdosage: If  you think you have taken too much of this medicine contact a poison control center or emergency room at once. NOTE: This medicine is only for you. Do not share this medicine with others. What if I miss a dose? If you miss a dose, take it as soon as you can. If it is almost time for your next dose, take only that dose. Do not take double or extra doses. What may interact with this medicine? -alcohol -MAOIs like Carbex, Eldepryl, Marplan, Nardil, and Parnate -medicines that cause drowsiness or sleep -other medicines for colds or allergies -ritonavir -theophylline This list may not describe all possible interactions. Give your health care provider a list of all the medicines, herbs, non-prescription drugs, or dietary supplements you use. Also tell them if you smoke, drink alcohol, or use illegal drugs. Some items may interact with your medicine. What should I watch for while using this medicine? Visit your doctor or health care professional for regular checks on your health. Tell your doctor or healthcare professional if your symptoms do not start to get better or if they get worse. You may get drowsy or dizzy. Do not drive, use machinery, or do anything that needs mental alertness until you know how this medicine affects you. Do not stand or sit up quickly, especially if you are an older patient. This reduces the risk of dizzy or  fainting spells. Alcohol may interfere with the effect of this medicine. Avoid alcoholic drinks. Your mouth may get dry. Chewing sugarless gum or sucking hard candy, and drinking plenty of water may help. Contact your doctor if the problem does not go away or is severe. What side effects may I notice from receiving this medicine? Side effects that you should report to your doctor or health care professional as soon as possible: -allergic reactions like skin rash, itching or hives, swelling of the face, lips, or tongue -changes in vision or hearing -fever -trouble passing  urine or change in the amount of urine Side effects that usually do not require medical attention (report to your doctor or health care professional if they continue or are bothersome): -cough -dizziness -drowsiness or tiredness -dry mouth -muscle aches This list may not describe all possible side effects. Call your doctor for medical advice about side effects. You may report side effects to FDA at 1-800-FDA-1088. Where should I keep my medicine? Keep out of the reach of children. Store at room temperature between 15 and 30 degrees C (59 and 86 degrees F). Throw away any unused medicine after the expiration date. NOTE: This sheet is a summary. It may not cover all possible information. If you have questions about this medicine, talk to your doctor, pharmacist, or health care provider.  2019 Elsevier/Gold Standard (2008-08-02 11:17:47)

## 2019-03-17 ENCOUNTER — Encounter: Payer: Self-pay | Admitting: Family Medicine

## 2019-03-17 DIAGNOSIS — Z6839 Body mass index (BMI) 39.0-39.9, adult: Principal | ICD-10-CM

## 2019-03-17 DIAGNOSIS — E66812 Obesity, class 2: Secondary | ICD-10-CM | POA: Insufficient documentation

## 2019-03-17 DIAGNOSIS — J302 Other seasonal allergic rhinitis: Secondary | ICD-10-CM | POA: Insufficient documentation

## 2019-04-26 ENCOUNTER — Other Ambulatory Visit: Payer: Self-pay | Admitting: Infectious Disease

## 2019-04-26 DIAGNOSIS — B20 Human immunodeficiency virus [HIV] disease: Secondary | ICD-10-CM

## 2019-06-14 ENCOUNTER — Ambulatory Visit: Payer: Self-pay

## 2019-06-14 ENCOUNTER — Other Ambulatory Visit: Payer: Self-pay

## 2019-06-14 DIAGNOSIS — B20 Human immunodeficiency virus [HIV] disease: Secondary | ICD-10-CM

## 2019-06-15 LAB — CYTOLOGY, (ORAL, ANAL, URETHRAL) ANCILLARY ONLY
Chlamydia: NEGATIVE
Neisseria Gonorrhea: NEGATIVE

## 2019-06-15 LAB — T-HELPER CELL (CD4) - (RCID CLINIC ONLY)
CD4 % Helper T Cell: 22 % — ABNORMAL LOW (ref 33–65)
CD4 T Cell Abs: 408 /uL (ref 400–1790)

## 2019-06-17 ENCOUNTER — Encounter: Payer: Self-pay | Admitting: Infectious Disease

## 2019-06-17 LAB — COMPLETE METABOLIC PANEL WITH GFR
AG Ratio: 1.5 (calc) (ref 1.0–2.5)
ALT: 30 U/L (ref 9–46)
AST: 30 U/L (ref 10–40)
Albumin: 4.5 g/dL (ref 3.6–5.1)
Alkaline phosphatase (APISO): 79 U/L (ref 36–130)
BUN: 13 mg/dL (ref 7–25)
CO2: 31 mmol/L (ref 20–32)
Calcium: 9.7 mg/dL (ref 8.6–10.3)
Chloride: 103 mmol/L (ref 98–110)
Creat: 1.14 mg/dL (ref 0.60–1.35)
GFR, Est African American: 91 mL/min/{1.73_m2} (ref >=60)
GFR, Est Non African American: 79 mL/min/{1.73_m2} (ref >=60)
Globulin: 3.1 g/dL (calc) (ref 1.9–3.7)
Glucose, Bld: 126 mg/dL — ABNORMAL HIGH (ref 65–99)
Potassium: 4.7 mmol/L (ref 3.5–5.3)
Sodium: 140 mmol/L (ref 135–146)
Total Bilirubin: 0.4 mg/dL (ref 0.2–1.2)
Total Protein: 7.6 g/dL (ref 6.1–8.1)

## 2019-06-17 LAB — CBC WITH DIFFERENTIAL/PLATELET
Absolute Monocytes: 418 cells/uL (ref 200–950)
Basophils Absolute: 48 cells/uL (ref 0–200)
Basophils Relative: 1.1 %
Eosinophils Absolute: 62 cells/uL (ref 15–500)
Eosinophils Relative: 1.4 %
HCT: 47.7 % (ref 38.5–50.0)
Hemoglobin: 16.2 g/dL (ref 13.2–17.1)
Lymphs Abs: 2072 cells/uL (ref 850–3900)
MCH: 32.1 pg (ref 27.0–33.0)
MCHC: 34 g/dL (ref 32.0–36.0)
MCV: 94.6 fL (ref 80.0–100.0)
MPV: 9.2 fL (ref 7.5–12.5)
Monocytes Relative: 9.5 %
Neutro Abs: 1800 cells/uL (ref 1500–7800)
Neutrophils Relative %: 40.9 %
Platelets: 278 10*3/uL (ref 140–400)
RBC: 5.04 10*6/uL (ref 4.20–5.80)
RDW: 13.3 % (ref 11.0–15.0)
Total Lymphocyte: 47.1 %
WBC: 4.4 10*3/uL (ref 3.8–10.8)

## 2019-06-17 LAB — LIPID PANEL
Cholesterol: 191 mg/dL (ref <200)
HDL: 43 mg/dL (ref >=40)
LDL Cholesterol (Calc): 130 mg/dL (calc) — ABNORMAL HIGH
Non-HDL Cholesterol (Calc): 148 mg/dL (calc) — ABNORMAL HIGH (ref <130)
Total CHOL/HDL Ratio: 4.4 (calc) (ref <5.0)
Triglycerides: 82 mg/dL (ref <150)

## 2019-06-17 LAB — HIV-1 RNA QUANT-NO REFLEX-BLD
HIV 1 RNA Quant: <20 copies/mL
HIV-1 RNA Quant, Log: <1.3 Log copies/mL

## 2019-06-17 LAB — RPR: RPR Ser Ql: NONREACTIVE

## 2019-07-11 ENCOUNTER — Other Ambulatory Visit: Payer: Self-pay

## 2019-07-11 ENCOUNTER — Encounter: Payer: Self-pay | Admitting: Infectious Disease

## 2019-07-11 ENCOUNTER — Ambulatory Visit (INDEPENDENT_AMBULATORY_CARE_PROVIDER_SITE_OTHER): Payer: Self-pay | Admitting: Infectious Disease

## 2019-07-11 VITALS — BP 144/81 | HR 73 | Temp 97.5°F

## 2019-07-11 DIAGNOSIS — R635 Abnormal weight gain: Secondary | ICD-10-CM

## 2019-07-11 DIAGNOSIS — Z113 Encounter for screening for infections with a predominantly sexual mode of transmission: Secondary | ICD-10-CM

## 2019-07-11 DIAGNOSIS — I10 Essential (primary) hypertension: Secondary | ICD-10-CM

## 2019-07-11 DIAGNOSIS — B20 Human immunodeficiency virus [HIV] disease: Secondary | ICD-10-CM

## 2019-07-11 NOTE — Progress Notes (Signed)
Chief complaint: Follow-up for his HIV and AIDS  Subjective:    Patient ID: Walter [name], male    DOB: [birth year], 43 y.o.   MRN: [id number]  HPI  43 year old African American man diagnosed with HIV/AIDS in 2017. He has been on TIVICAY and DESCOVY with nice suppression with most recent VL  He continues to be highly adherent.  Also is noticed that he gained 70 pounds since initiating antiretroviral medication.  \   He has successfully lost 20 to 30 pounds by restricting carbohydrates and cutting out sodas        Past Medical History:  Diagnosis Date  . Acute esophagitis 10/08/2015  . AIDS (HCC) 10/08/2015  . Essential hypertension   . Hyperglycemia 11/30/2017  . Screen for STD (sexually transmitted disease) 01/23/2016  . Seasonal allergies   . Weight gain 12/14/2018    No past surgical history on file.  Family History  Problem Relation Age of Onset  . Hypertension Mother   . Diabetes Father       Social History   Socioeconomic History  . Marital status: Single    Spouse name: Not on file  . Number of children: Not on file  . Years of education: Not on file  . Highest education level: Not on file  Occupational History  . Not on file  Social Needs  . Financial resource strain: Not on file  . Food insecurity    Worry: Not on file    Inability: Not on file  . Transportation needs    Medical: Not on file    Non-medical: Not on file  Tobacco Use  . Smoking status: Never Smoker  . Smokeless tobacco: Never Used  Substance and Sexual Activity  . Alcohol use: Yes    Alcohol/week: 0.0 standard drinks    Comment: Once a week.   . Drug use: No  . Sexual activity: Not Currently    Partners: Male    Comment: pt given condoms  Lifestyle  . Physical activity    Days per week: Not on file    Minutes per session: Not on file  . Stress: Not on file  Relationships  . Social Musicianconnections    Talks on phone: Not on file    Gets together: Not on file    Attends  religious service: Not on file    Active member of club or organization: Not on file    Attends meetings of clubs or organizations: Not on file    Relationship status: Not on file  Other Topics Concern  . Not on file  Social History Narrative  . Not on file    Allergies  Allergen Reactions  . Shrimp [Shellfish Allergy] Nausea And Vomiting     Current Outpatient Medications:  .  Cyanocobalamin (VITAMIN B 12 PO), Take 1,000 mcg by mouth daily., Disp: , Rfl:  .  DESCOVY 200-25 MG tablet, TAKE 1 TABLET BY MOUTH DAILY, Disp: 30 tablet, Rfl: 3 .  fluticasone (FLONASE) 50 MCG/ACT nasal spray, Place 2 sprays into both nostrils daily., Disp: 16 g, Rfl: 6 .  hydrochlorothiazide (HYDRODIURIL) 25 MG tablet, TAKE 1 TABLET(25 MG) BY MOUTH DAILY, Disp: 30 tablet, Rfl: 3 .  ibuprofen (ADVIL,MOTRIN) 200 MG tablet, Take 400 mg by mouth every 6 (six) hours as needed for headache, mild pain or moderate pain. , Disp: , Rfl:  .  levocetirizine (XYZAL) 5 MG tablet, Take 1 tablet (5 mg total) by mouth every evening.,  Disp: 30 tablet, Rfl: 6 .  Multiple Vitamin (MULTIVITAMIN WITH MINERALS) TABS tablet, Take 1 tablet by mouth daily., Disp: , Rfl:  .  sodium chloride (OCEAN) 0.65 % SOLN nasal spray, Place 1 spray into both nostrils as needed for congestion., Disp: 15 mL, Rfl: 6 .  TIVICAY 50 MG tablet, TAKE 1 TABLET(50 MG) BY MOUTH DAILY, Disp: 30 tablet, Rfl: 3   Review of Systems  Constitutional: Negative for activity change, appetite change, chills, diaphoresis, fatigue and fever.  HENT: Negative for congestion, rhinorrhea, sinus pressure, sneezing, sore throat and trouble swallowing.   Eyes: Negative for photophobia and visual disturbance.  Respiratory: Negative for cough, chest tightness, shortness of breath, wheezing and stridor.   Cardiovascular: Negative for chest pain, palpitations and leg swelling.  Gastrointestinal: Negative for abdominal distention, abdominal pain, anal bleeding, blood in stool,  constipation, diarrhea, nausea and vomiting.  Genitourinary: Negative for difficulty urinating, dysuria, flank pain and hematuria.  Musculoskeletal: Negative for arthralgias, back pain, gait problem, joint swelling and myalgias.  Skin: Negative for color change, pallor, rash and wound.  Neurological: Negative for dizziness, tremors, weakness and light-headedness.  Hematological: Negative for adenopathy. Does not bruise/bleed easily.  Psychiatric/Behavioral: Negative for agitation, behavioral problems, confusion, decreased concentration, dysphoric mood, self-injury and sleep disturbance.       Objective:   Physical Exam  Constitutional: He is oriented to person, place, and time. He appears well-developed and well-nourished.  HENT:  Head: Normocephalic and atraumatic.  Eyes: Conjunctivae and EOM are normal.  Neck: Normal range of motion. Neck supple.  Cardiovascular: Normal rate, regular rhythm and normal heart sounds.  Pulmonary/Chest: Effort normal and breath sounds normal. No respiratory distress.  Abdominal: Soft. He exhibits no distension.  Musculoskeletal: Normal range of motion.        General: No tenderness, deformity or edema.  Neurological: He is alert and oriented to person, place, and time.  Skin: Skin is warm and dry. No rash noted. No erythema. No pallor.  Psychiatric: He has a normal mood and affect. His behavior is normal. Judgment and thought content normal.  Nursing note and vitals reviewed.         Assessment & Plan:   HIV/AIDS: on TIVICAY and DESCOVY.  He is interested in changing to [place] but would like to do so right before his next time to renew his HIV medication assistance program which I will schedule visit in November to discuss the change.   HTN:  Taking HCTZ.   Weight gain: He has successfully lost weight through dietary modification we can consider switching to a non-antegrade strand transfer inhibitor based regimen in the future.  I spent  greater than 25 minutes with the patient including greater than 50% of time in face to face counsel of the patient regarding nature of newer antiretrovirals in association with weight gain and in coordination of his care.   There were no vitals filed for this visit.

## 2019-08-04 ENCOUNTER — Other Ambulatory Visit: Payer: Self-pay | Admitting: Infectious Disease

## 2019-08-04 ENCOUNTER — Other Ambulatory Visit: Payer: Self-pay

## 2019-08-04 ENCOUNTER — Ambulatory Visit (INDEPENDENT_AMBULATORY_CARE_PROVIDER_SITE_OTHER): Payer: Self-pay

## 2019-08-04 DIAGNOSIS — Z23 Encounter for immunization: Secondary | ICD-10-CM

## 2019-08-04 DIAGNOSIS — B20 Human immunodeficiency virus [HIV] disease: Secondary | ICD-10-CM

## 2019-09-15 ENCOUNTER — Ambulatory Visit: Payer: Self-pay | Admitting: Family Medicine

## 2019-09-20 ENCOUNTER — Other Ambulatory Visit: Payer: Self-pay | Admitting: Infectious Disease

## 2019-09-20 DIAGNOSIS — B20 Human immunodeficiency virus [HIV] disease: Secondary | ICD-10-CM

## 2019-09-27 ENCOUNTER — Encounter: Payer: Self-pay | Admitting: Family Medicine

## 2019-09-27 ENCOUNTER — Other Ambulatory Visit: Payer: Self-pay

## 2019-09-27 ENCOUNTER — Ambulatory Visit (INDEPENDENT_AMBULATORY_CARE_PROVIDER_SITE_OTHER): Payer: Self-pay | Admitting: Family Medicine

## 2019-09-27 VITALS — BP 133/71 | HR 72 | Temp 98.0°F | Ht 69.5 in | Wt 252.6 lb

## 2019-09-27 DIAGNOSIS — Z0000 Encounter for general adult medical examination without abnormal findings: Secondary | ICD-10-CM

## 2019-09-27 DIAGNOSIS — R634 Abnormal weight loss: Secondary | ICD-10-CM | POA: Insufficient documentation

## 2019-09-27 DIAGNOSIS — Z6836 Body mass index (BMI) 36.0-36.9, adult: Secondary | ICD-10-CM

## 2019-09-27 DIAGNOSIS — Z09 Encounter for follow-up examination after completed treatment for conditions other than malignant neoplasm: Secondary | ICD-10-CM

## 2019-09-27 DIAGNOSIS — J302 Other seasonal allergic rhinitis: Secondary | ICD-10-CM

## 2019-09-27 DIAGNOSIS — R7303 Prediabetes: Secondary | ICD-10-CM | POA: Insufficient documentation

## 2019-09-27 DIAGNOSIS — I10 Essential (primary) hypertension: Secondary | ICD-10-CM

## 2019-09-27 LAB — POCT GLYCOSYLATED HEMOGLOBIN (HGB A1C): Hemoglobin A1C: 5.4 % (ref 4.0–5.6)

## 2019-09-27 LAB — POCT URINALYSIS DIPSTICK
Appearance: NEGATIVE
Bilirubin, UA: NEGATIVE
Blood, UA: NEGATIVE
Glucose, UA: NEGATIVE
Ketones, UA: NEGATIVE
Leukocytes, UA: NEGATIVE
Nitrite, UA: NEGATIVE
Protein, UA: NEGATIVE
Spec Grav, UA: >=1.03 — AB (ref 1.010–1.025)
Urobilinogen, UA: 0.2 E.U./dL
pH, UA: 6 (ref 5.0–8.0)

## 2019-09-27 LAB — GLUCOSE, POCT (MANUAL RESULT ENTRY): POC Glucose: 134 mg/dl — AB (ref 70–99)

## 2019-09-27 NOTE — Progress Notes (Signed)
[facility]   Established Patient Office Visit  Subjective:  Patient ID: Walter [name], male    DOB: 08-24-1976  Age: 43 y.o. MRN: [id number]  CC:  Chief Complaint  Patient presents with   6 month follow up    HTN    HPI Walter [name] is a 43 year old male who presents for Follow Up today.   Past Medical History:  Diagnosis Date   Acute esophagitis 10/08/2015   AIDS ([place]) 10/08/2015   Essential hypertension    Hyperglycemia 11/30/2017   Prediabetes 02/2019   Screen for STD (sexually transmitted disease) 01/23/2016   Seasonal allergies    Weight gain 12/14/2018   Current Status: Since he last office visit, he is doing well with no complaints. He denies visual changes, chest pain, cough, shortness of breath, heart palpitations, and falls. He has occasional headaches and dizziness with position changes. Denies severe headaches, confusion, seizures, double vision, and blurred vision, nausea and vomiting. He continues to follow up with Infection Disease. His last office visit was on 6 months ago. He has recently changed his diet to "TLC" (Total Life Changes). He is feeling better, and blood pressures a much improved. He has had a 20 lb weight loss in 11 months. He denies fevers, chills, fatigue, recent infections, weight loss, and night sweats. He has not had any headaches, visual changes, dizziness, and falls. No chest pain, heart palpitations, cough and shortness of breath reported. No reports of GI problems such as nausea, vomiting, diarrhea, and constipation. He has no reports of blood in stools, dysuria and hematuria. No depression or anxiety reported today. He denies pain today.   History reviewed. No pertinent surgical history.  Family History  Problem Relation Age of Onset   Hypertension Mother    Diabetes Father     Social History   Socioeconomic History   Marital status: Single    Spouse name: Not on file     Number of children: Not on file   Years of education: Not on file   Highest education level: Not on file  Occupational History   Not on file  Social Needs   Financial resource strain: Not on file   Food insecurity    Worry: Not on file    Inability: Not on file   Transportation needs    Medical: Not on file    Non-medical: Not on file  Tobacco Use   Smoking status: Never Smoker   Smokeless tobacco: Never Used  Substance and Sexual Activity   Alcohol use: Yes    Alcohol/week: 0.0 standard drinks    Comment: Once a week.    Drug use: No   Sexual activity: Not Currently    Partners: Male    Comment: pt given condoms  Lifestyle   Physical activity    Days per week: Not on file    Minutes per session: Not on file   Stress: Not on file  Relationships   Social connections    Talks on phone: Not on file    Gets together: Not on file    Attends religious service: Not on file    Active member of club or organization: Not on file    Attends meetings of clubs or organizations: Not on file    Relationship status: Not on file   Intimate partner violence    Fear of current or ex partner: Not on file    Emotionally  abused: Not on file    Physically abused: Not on file    Forced sexual activity: Not on file  Other Topics Concern   Not on file  Social History Narrative   Not on file    Outpatient Medications Prior to Visit  Medication Sig Dispense Refill   Cyanocobalamin (VITAMIN B 12 PO) Take 1,000 mcg by mouth daily.     DESCOVY 200-25 MG tablet TAKE 1 TABLET BY MOUTH DAILY 30 tablet 1   fluticasone (FLONASE) 50 MCG/ACT nasal spray Place 2 sprays into both nostrils daily. 16 g 6   hydrochlorothiazide (HYDRODIURIL) 25 MG tablet TAKE 1 TABLET BY MOUTH DAILY 30 tablet 3   ibuprofen (ADVIL,MOTRIN) 200 MG tablet Take 400 mg by mouth every 6 (six) hours as needed for headache, mild pain or moderate pain.      levocetirizine (XYZAL) 5 MG tablet Take 1 tablet  (5 mg total) by mouth every evening. 30 tablet 6   Multiple Vitamin (MULTIVITAMIN WITH MINERALS) TABS tablet Take 1 tablet by mouth daily.     sodium chloride (OCEAN) 0.65 % SOLN nasal spray Place 1 spray into both nostrils as needed for congestion. 15 mL 6   TIVICAY 50 MG tablet TAKE 1 TABLET BY MOUTH DAILY 30 tablet 1   No facility-administered medications prior to visit.     Allergies  Allergen Reactions   Shrimp [Shellfish Allergy] Nausea And Vomiting    ROS Review of Systems  Constitutional: Negative.   HENT: Negative.   Eyes: Negative.   Cardiovascular: Negative.   Gastrointestinal: Positive for abdominal distention.  Endocrine: Negative.   Genitourinary: Negative.   Musculoskeletal: Negative.   Skin: Negative.   Allergic/Immunologic: Negative.   Neurological: Negative.   Hematological: Negative.   Psychiatric/Behavioral: Negative.    Objective:    Physical Exam  Constitutional: He is oriented to person, place, and time. He appears well-developed and well-nourished.  HENT:  Head: Normocephalic and atraumatic.  Eyes: Conjunctivae are normal.  Neck: Normal range of motion. Neck supple.  Cardiovascular: Normal rate, regular rhythm, normal heart sounds and intact distal pulses.  Pulmonary/Chest: Effort normal and breath sounds normal.  Abdominal: Soft. Bowel sounds are normal. He exhibits distension (obese).  Musculoskeletal: Normal range of motion.  Neurological: He is alert and oriented to person, place, and time. He has normal reflexes.  Skin: Skin is warm and dry.  Psychiatric: He has a normal mood and affect. His behavior is normal. Thought content normal.  Nursing note and vitals reviewed.   BP 133/71    Pulse 72    Temp 98 F (36.7 C) (Oral)    Ht 5' 9.5" (1.765 m)    Wt 252 lb 9.6 oz (114.6 kg)    SpO2 100%    BMI 36.77 kg/m  Wt Readings from Last 3 Encounters:  09/27/19 252 lb 9.6 oz (114.6 kg)  12/14/18 269 lb (122 kg)  12/08/18 270 lb (122.5 kg)       Health Maintenance Due  Topic Date Due   TETANUS/TDAP  12/22/1995    There are no preventive care reminders to display for this patient.  Lab Results  Component Value Date   TSH 1.370 12/08/2018   Lab Results  Component Value Date   WBC 4.4 06/14/2019   HGB 16.2 06/14/2019   HCT 47.7 06/14/2019   MCV 94.6 06/14/2019   PLT 278 06/14/2019   Lab Results  Component Value Date   NA 140 06/14/2019   K 4.7 06/14/2019  CO2 31 06/14/2019   GLUCOSE 126 (H) 06/14/2019   BUN 13 06/14/2019   CREATININE 1.14 06/14/2019   BILITOT 0.4 06/14/2019   ALKPHOS 94 05/11/2017   AST 30 06/14/2019   ALT 30 06/14/2019   PROT 7.6 06/14/2019   ALBUMIN 4.1 05/11/2017   CALCIUM 9.7 06/14/2019   ANIONGAP 8 10/04/2015   Lab Results  Component Value Date   CHOL 191 06/14/2019   Lab Results  Component Value Date   HDL 43 06/14/2019   Lab Results  Component Value Date   LDLCALC 130 (H) 06/14/2019   Lab Results  Component Value Date   TRIG 82 06/14/2019   Lab Results  Component Value Date   CHOLHDL 4.4 06/14/2019   Lab Results  Component Value Date   HGBA1C 5.4 09/27/2019   Assessment & Plan:   1. Essential hypertension The current medical regimen is effective; He is stable at 133/71 today; continue present plan and medications as prescribed. He will continue to take medications as prescribed, to decrease high sodium intake, excessive alcohol intake, increase potassium intake, smoking cessation, and increase physical activity of at least 30 minutes of cardio activity daily. He will continue to follow Heart Healthy or DASH diet.  2. Weight loss 20 lb weight loss in 10 months. Continue healthier lifestyle by decreasing high sodium intake, excessive alcohol intake, increase potassium intake, smoking cessation, and increase physical activity of at least 30 minutes of cardio activity daily. He will continue to follow Heart Healthy or DASH diet.  3. Class 2 severe obesity due to  excess calories with serious comorbidity and body mass index (BMI) of 36.0 to 36.9 in adult [facility](HCC) Body mass index is 36.77 kg/m. Goal BMI  is <30. Encouraged efforts to reduce weight include engaging in physical activity as tolerated with goal of 150 minutes per week. Improve dietary choices and eat a meal regimen consistent with a Mediterranean or DASH diet. Reduce simple carbohydrates. Do not skip meals and eat healthy snacks throughout the day to avoid over-eating at dinner. Set a goal weight loss that is achievable for you.  4. Seasonal allergies Stable today. Continue antihistamine as prescribed.   5. Prediabetes Hgb A1c is stable at 5.4 today. He will continue medication as prescribed, to decrease foods/beverages high in sugars and carbs and follow Heart Healthy or DASH diet. Increase physical activity to at least 30 minutes cardio exercise daily.   6. Healthcare maintenance - POCT glycosylated hemoglobin (Hb A1C) - POCT urinalysis dipstick - POCT glucose (manual entry)  7. Follow up He will follow up in 6 months.   Problem List Items Addressed This Visit      Cardiovascular and Mediastinum   HTN (hypertension) - Primary     Other   Class 2 severe obesity due to excess calories with serious comorbidity and body mass index (BMI) of 39.0 to 39.9 in adult [facility](HCC)   Seasonal allergies    Other Visit Diagnoses    Weight loss       Prediabetes       Healthcare maintenance       Relevant Orders   POCT glycosylated hemoglobin (Hb A1C) (Completed)   POCT urinalysis dipstick (Completed)   POCT glucose (manual entry) (Completed)   Follow up          No orders of the defined types were placed in this encounter.   Follow-up: Return in about 6 months (around 03/26/2020).    Kallie LocksNatalie M Carmon Sahli, FNP

## 2019-10-12 ENCOUNTER — Encounter: Payer: Self-pay | Admitting: Infectious Disease

## 2019-10-12 ENCOUNTER — Other Ambulatory Visit: Payer: Self-pay

## 2019-10-12 ENCOUNTER — Ambulatory Visit (INDEPENDENT_AMBULATORY_CARE_PROVIDER_SITE_OTHER): Payer: Self-pay | Admitting: Infectious Disease

## 2019-10-12 DIAGNOSIS — B20 Human immunodeficiency virus [HIV] disease: Secondary | ICD-10-CM

## 2019-10-12 DIAGNOSIS — I10 Essential (primary) hypertension: Secondary | ICD-10-CM

## 2019-10-12 DIAGNOSIS — Z113 Encounter for screening for infections with a predominantly sexual mode of transmission: Secondary | ICD-10-CM

## 2019-10-12 MED ORDER — BIKTARVY 50-200-25 MG PO TABS: 1.0000 | ORAL_TABLET | Freq: Every day | ORAL | 11 refills | Status: DC

## 2019-10-12 NOTE — Progress Notes (Signed)
Virtual Visit via Telephone Note  I connected with Walter [name] on 10/12/19 at 11:00 AM EST by telephone and verified that I am speaking with the correct person using two identifiers.  Location: Patient: Home Provider: [facility]   I discussed the limitations, risks, security and privacy concerns of performing an evaluation and management service by telephone and the availability of in person appointments. I also discussed with the patient that there may be a patient responsible charge related to this service. The patient expressed understanding and agreed to proceed.  Chief complaint: For HIV disease on medications  History of Present Illness:   43 year old African American man diagnosed with HIV/AIDS in 2017. He has been on TIVICAY and DESCOVY with nice suppression with most recent VL  He continues to be highly adherent.  He had noticed that he gained 70 pounds since initiating antiretroviral medication.     He has successfully lost 20 to 30 pounds by restricting carbohydrates and cutting out sodas   We discussed changing over to single tablet regimen in the form of Biktarvy.  I also reviewed the option of going to [place].  He would like to make the switch to [place] today as he has several friends were on it like this medication quite a bit.  I will check to change him over and we can recheck his labs in 2 months time which will be around the time he needs to renew his HIV medication assistance program     Past Medical History:  Diagnosis Date  . Acute esophagitis 10/08/2015  . AIDS (HCC) 10/08/2015  . Essential hypertension   . Hyperglycemia 11/30/2017  . Prediabetes 02/2019  . Screen for STD (sexually transmitted disease) 01/23/2016  . Seasonal allergies   . Weight gain 12/14/2018    No past surgical history on file.  Family History  Problem Relation Age of Onset  . Hypertension Mother   . Diabetes Father       Social History   Socioeconomic History  .  Marital status: Single    Spouse name: Not on file  . Number of children: Not on file  . Years of education: Not on file  . Highest education level: Not on file  Occupational History  . Not on file  Social Needs  . Financial resource strain: Not on file  . Food insecurity    Worry: Not on file    Inability: Not on file  . Transportation needs    Medical: Not on file    Non-medical: Not on file  Tobacco Use  . Smoking status: Never Smoker  . Smokeless tobacco: Never Used  Substance and Sexual Activity  . Alcohol use: Yes    Alcohol/week: 0.0 standard drinks    Comment: Once a week.   . Drug use: No  . Sexual activity: Not Currently    Partners: Male    Comment: pt given condoms  Lifestyle  . Physical activity    Days per week: Not on file    Minutes per session: Not on file  . Stress: Not on file  Relationships  . Social Musician on phone: Not on file    Gets together: Not on file    Attends religious service: Not on file    Active member of club or organization: Not on file    Attends meetings of clubs or organizations: Not on file    Relationship status: Not on file  Other Topics  Concern  . Not on file  Social History Narrative  . Not on file    Allergies  Allergen Reactions  . Shrimp [Shellfish Allergy] Nausea And Vomiting     Current Outpatient Medications:  .  bictegravir-emtricitabine-tenofovir AF (BIKTARVY) 50-200-25 MG TABS tablet, Take 1 tablet by mouth daily., Disp: 30 tablet, Rfl: 11 .  Cyanocobalamin (VITAMIN B 12 PO), Take 1,000 mcg by mouth daily., Disp: , Rfl:  .  fluticasone (FLONASE) 50 MCG/ACT nasal spray, Place 2 sprays into both nostrils daily., Disp: 16 g, Rfl: 6 .  hydrochlorothiazide (HYDRODIURIL) 25 MG tablet, TAKE 1 TABLET BY MOUTH DAILY, Disp: 30 tablet, Rfl: 3 .  ibuprofen (ADVIL,MOTRIN) 200 MG tablet, Take 400 mg by mouth every 6 (six) hours as needed for headache, mild pain or moderate pain. , Disp: , Rfl:  .   levocetirizine (XYZAL) 5 MG tablet, Take 1 tablet (5 mg total) by mouth every evening., Disp: 30 tablet, Rfl: 6 .  Multiple Vitamin (MULTIVITAMIN WITH MINERALS) TABS tablet, Take 1 tablet by mouth daily., Disp: , Rfl:  .  sodium chloride (OCEAN) 0.65 % SOLN nasal spray, Place 1 spray into both nostrils as needed for congestion., Disp: 15 mL, Rfl: 6   Review of Systems  Constitutional: Negative for activity change, appetite change, chills, diaphoresis, fatigue and fever.  HENT: Negative for congestion, rhinorrhea, sinus pressure, sneezing, sore throat and trouble swallowing.   Eyes: Negative for photophobia and visual disturbance.  Respiratory: Negative for cough, chest tightness, shortness of breath, wheezing and stridor.   Cardiovascular: Negative for chest pain, palpitations and leg swelling.  Gastrointestinal: Negative for abdominal distention, abdominal pain, anal bleeding, blood in stool, constipation, diarrhea, nausea and vomiting.  Genitourinary: Negative for difficulty urinating, dysuria, flank pain and hematuria.  Musculoskeletal: Negative for arthralgias, back pain, gait problem, joint swelling and myalgias.  Skin: Negative for color change, pallor, rash and wound.  Neurological: Negative for dizziness, tremors, weakness and light-headedness.  Hematological: Negative for adenopathy. Does not bruise/bleed easily.  Psychiatric/Behavioral: Negative for agitation, behavioral problems, confusion, decreased concentration, dysphoric mood, self-injury and sleep disturbance.      Observations/Objective: Walter [name] seems to be in quite well and is eager to switch to [facility] in terms of controlling his HIV virus is always been highly adherent.  He seems to be lost some weight as well and has had his flu shot  Assessment and Plan:  HIV disease: Switch to [facility] check labs in 2 months time and see me at that point time with renewal of his HMAP  Overweight: Continue to work on carbohydrate  restriction.  Could consider coming off the antegrade strand transfer inhibitor but he seems to like these regimens quite a bit could consider switch to DOR based regimen in the future  HTN: followed by PCP  Follow Up Instructions:    I discussed the assessment and treatment plan with the patient. The patient was provided an opportunity to ask questions and all were answered. The patient agreed with the plan and demonstrated an understanding of the instructions.   The patient was advised to call back or seek an in-person evaluation if the symptoms worsen or if the condition fails to improve as anticipated.  I provided 15 minutes of non-face-to-face time during this encounter.   Alcide Evener, MD

## 2019-10-19 ENCOUNTER — Other Ambulatory Visit: Payer: Self-pay

## 2019-10-19 DIAGNOSIS — Z20822 Contact with and (suspected) exposure to covid-19: Secondary | ICD-10-CM

## 2019-10-21 LAB — NOVEL CORONAVIRUS, NAA: SARS-CoV-2, NAA: NOT DETECTED

## 2019-10-27 ENCOUNTER — Other Ambulatory Visit: Payer: Self-pay

## 2019-10-27 DIAGNOSIS — Z20822 Contact with and (suspected) exposure to covid-19: Secondary | ICD-10-CM

## 2019-10-30 LAB — NOVEL CORONAVIRUS, NAA: SARS-CoV-2, NAA: DETECTED — AB

## 2019-10-31 ENCOUNTER — Telehealth: Payer: Self-pay | Admitting: Nurse Practitioner

## 2019-10-31 NOTE — Telephone Encounter (Signed)
Called to Discuss with patient about Covid symptoms and the use of bamlanivimab, a monoclonal antibody infusion for those with mild to moderate Covid symptoms and at a high risk of hospitalization.     Pt is qualified for this infusion at the [facility] infusion center due to co-morbid conditions and/or a member of an at-risk group.     Patient is being managed for the following problems:  Patient Active Problem List   Diagnosis Date Noted  . Weight loss 09/27/2019  . Prediabetes 09/27/2019  . Class 2 severe obesity due to excess calories with serious comorbidity and body mass index (BMI) of 39.0 to 39.9 in adult [facility]) 03/17/2019  . Seasonal allergies 03/17/2019  . Weight gain 12/14/2018  . Hyperglycemia 11/30/2017  . HTN (hypertension) 01/23/2016  . Screen for STD (sexually transmitted disease) 01/23/2016  . AIDS ([place]) 10/08/2015  . Acute esophagitis 10/08/2015    Patient states that he is doing well and not currently having any symptoms.

## 2019-11-30 ENCOUNTER — Ambulatory Visit: Payer: Self-pay

## 2019-11-30 ENCOUNTER — Other Ambulatory Visit: Payer: Self-pay

## 2019-11-30 DIAGNOSIS — B20 Human immunodeficiency virus [HIV] disease: Secondary | ICD-10-CM

## 2019-12-01 LAB — T-HELPER CELL (CD4) - (RCID CLINIC ONLY)
CD4 % Helper T Cell: 23 % — ABNORMAL LOW (ref 33–65)
CD4 T Cell Abs: 433 /uL (ref 400–1790)

## 2019-12-07 LAB — CBC WITH DIFFERENTIAL/PLATELET
Absolute Monocytes: 439 cells/uL (ref 200–950)
Basophils Absolute: 70 cells/uL (ref 0–200)
Basophils Relative: 1.7 %
Eosinophils Absolute: 62 cells/uL (ref 15–500)
Eosinophils Relative: 1.5 %
HCT: 46.8 % (ref 38.5–50.0)
Hemoglobin: 15.9 g/dL (ref 13.2–17.1)
Lymphs Abs: 1931 cells/uL (ref 850–3900)
MCH: 31.9 pg (ref 27.0–33.0)
MCHC: 34 g/dL (ref 32.0–36.0)
MCV: 94 fL (ref 80.0–100.0)
MPV: 9.1 fL (ref 7.5–12.5)
Monocytes Relative: 10.7 %
Neutro Abs: 1599 cells/uL (ref 1500–7800)
Neutrophils Relative %: 39 %
Platelets: 272 10*3/uL (ref 140–400)
RBC: 4.98 10*6/uL (ref 4.20–5.80)
RDW: 12.9 % (ref 11.0–15.0)
Total Lymphocyte: 47.1 %
WBC: 4.1 10*3/uL (ref 3.8–10.8)

## 2019-12-07 LAB — COMPLETE METABOLIC PANEL WITH GFR
AG Ratio: 1.5 (calc) (ref 1.0–2.5)
ALT: 29 U/L (ref 9–46)
AST: 28 U/L (ref 10–40)
Albumin: 4.5 g/dL (ref 3.6–5.1)
Alkaline phosphatase (APISO): 77 U/L (ref 36–130)
BUN: 12 mg/dL (ref 7–25)
CO2: 27 mmol/L (ref 20–32)
Calcium: 9.8 mg/dL (ref 8.6–10.3)
Chloride: 102 mmol/L (ref 98–110)
Creat: 1.14 mg/dL (ref 0.60–1.35)
GFR, Est African American: 91 mL/min/{1.73_m2} (ref >=60)
GFR, Est Non African American: 79 mL/min/{1.73_m2} (ref >=60)
Globulin: 3 g/dL (calc) (ref 1.9–3.7)
Glucose, Bld: 110 mg/dL — ABNORMAL HIGH (ref 65–99)
Potassium: 3.8 mmol/L (ref 3.5–5.3)
Sodium: 139 mmol/L (ref 135–146)
Total Bilirubin: 0.5 mg/dL (ref 0.2–1.2)
Total Protein: 7.5 g/dL (ref 6.1–8.1)

## 2019-12-07 LAB — RPR: RPR Ser Ql: NONREACTIVE

## 2019-12-07 LAB — HIV-1 RNA QUANT-NO REFLEX-BLD
HIV 1 RNA Quant: <20 copies/mL
HIV-1 RNA Quant, Log: <1.3 Log copies/mL

## 2019-12-19 ENCOUNTER — Other Ambulatory Visit: Payer: Self-pay

## 2019-12-19 ENCOUNTER — Ambulatory Visit (INDEPENDENT_AMBULATORY_CARE_PROVIDER_SITE_OTHER): Payer: Self-pay | Admitting: Infectious Disease

## 2019-12-19 ENCOUNTER — Encounter: Payer: Self-pay | Admitting: Infectious Disease

## 2019-12-19 DIAGNOSIS — Z6839 Body mass index (BMI) 39.0-39.9, adult: Secondary | ICD-10-CM

## 2019-12-19 DIAGNOSIS — Z113 Encounter for screening for infections with a predominantly sexual mode of transmission: Secondary | ICD-10-CM

## 2019-12-19 DIAGNOSIS — J302 Other seasonal allergic rhinitis: Secondary | ICD-10-CM

## 2019-12-19 DIAGNOSIS — U071 COVID-19: Secondary | ICD-10-CM

## 2019-12-19 DIAGNOSIS — I10 Essential (primary) hypertension: Secondary | ICD-10-CM

## 2019-12-19 DIAGNOSIS — R634 Abnormal weight loss: Secondary | ICD-10-CM

## 2019-12-19 DIAGNOSIS — B20 Human immunodeficiency virus [HIV] disease: Secondary | ICD-10-CM

## 2019-12-19 HISTORY — DX: COVID-19: U07.1

## 2019-12-19 MED ORDER — BIKTARVY 50-200-25 MG PO TABS: 1.0000 | ORAL_TABLET | Freq: Every day | ORAL | 11 refills | Status: DC

## 2019-12-19 NOTE — Progress Notes (Signed)
Virtual Visit via Telephone Note  I connected with Walter [name] on 12/19/19 at  9:45 AM EST by telephone and verified that I am speaking with the correct person using two identifiers.  Location: Patient: Home Provider: [facility]   I discussed the limitations, risks, security and privacy concerns of performing an evaluation and management service by telephone and the availability of in person appointments. I also discussed with the patient that there may be a patient responsible charge related to this service. The patient expressed understanding and agreed to proceed.  Chief complaint: For HIV disease on medications and hypertension  History of Present Illness:   44 year old African American man diagnosed with HIV/AIDS in 2017. He has been on [place] and DESCOVY with nice virologically suppression now changed over to [place].   He had noticed that he gained 70 pounds since initiating antiretroviral medication.    He has in the interim however been able to reduce this weight dramatically through carbohydrate restriction and exercise.  He has been on her chlorothiazide for hypertension but is been wondering whether or not he needs this medication anymore as his blood pressure continues to be in the normal range though he still is taking the HCTZ.   He did succumb to novel coronavirus 2019 infection in December and tested positive on October 27, 2019.  He had a very mild case of Covid with only loss of taste and smell and no other significant symptoms.  He is being offered vaccination through his employer.   Past Medical History:  Diagnosis Date  . Acute esophagitis 10/08/2015  . AIDS ([place]) 10/08/2015  . Essential hypertension   . Hyperglycemia 11/30/2017  . Prediabetes 02/2019  . Screen for STD (sexually transmitted disease) 01/23/2016  . Seasonal allergies   . Weight gain 12/14/2018    No past surgical history on file.  Family History  Problem Relation Age of Onset  .  Hypertension Mother   . Diabetes Father       Social History   Socioeconomic History  . Marital status: Single    Spouse name: Not on file  . Number of children: Not on file  . Years of education: Not on file  . Highest education level: Not on file  Occupational History  . Not on file  Tobacco Use  . Smoking status: Never Smoker  . Smokeless tobacco: Never Used  Substance and Sexual Activity  . Alcohol use: Yes    Alcohol/week: 0.0 standard drinks    Comment: Once a week.   . Drug use: No  . Sexual activity: Not Currently    Partners: Male    Comment: pt given condoms  Other Topics Concern  . Not on file  Social History Narrative  . Not on file   Social Determinants of Health   Financial Resource Strain:   . Difficulty of Paying Living Expenses: Not on file  Food Insecurity:   . Worried About Charity fundraiser in the Last Year: Not on file  . Ran Out of Food in the Last Year: Not on file  Transportation Needs:   . Lack of Transportation (Medical): Not on file  . Lack of Transportation (Non-Medical): Not on file  Physical Activity:   . Days of Exercise per Week: Not on file  . Minutes of Exercise per Session: Not on file  Stress:   . Feeling of Stress : Not on file  Social Connections:   . Frequency of Communication with  Friends and Family: Not on file  . Frequency of Social Gatherings with Friends and Family: Not on file  . Attends Religious Services: Not on file  . Active Member of Clubs or Organizations: Not on file  . Attends Banker Meetings: Not on file  . Marital Status: Not on file    Allergies  Allergen Reactions  . Shrimp [Shellfish Allergy] Nausea And Vomiting     Current Outpatient Medications:  .  bictegravir-emtricitabine-tenofovir AF (BIKTARVY) 50-200-25 MG TABS tablet, Take 1 tablet by mouth daily., Disp: 30 tablet, Rfl: 11 .  Cyanocobalamin (VITAMIN B 12 PO), Take 1,000 mcg by mouth daily., Disp: , Rfl:  .  fluticasone  (FLONASE) 50 MCG/ACT nasal spray, Place 2 sprays into both nostrils daily., Disp: 16 g, Rfl: 6 .  hydrochlorothiazide (HYDRODIURIL) 25 MG tablet, TAKE 1 TABLET BY MOUTH DAILY, Disp: 30 tablet, Rfl: 3 .  ibuprofen (ADVIL,MOTRIN) 200 MG tablet, Take 400 mg by mouth every 6 (six) hours as needed for headache, mild pain or moderate pain. , Disp: , Rfl:  .  levocetirizine (XYZAL) 5 MG tablet, Take 1 tablet (5 mg total) by mouth every evening., Disp: 30 tablet, Rfl: 6 .  Multiple Vitamin (MULTIVITAMIN WITH MINERALS) TABS tablet, Take 1 tablet by mouth daily., Disp: , Rfl:  .  sodium chloride (OCEAN) 0.65 % SOLN nasal spray, Place 1 spray into both nostrils as needed for congestion., Disp: 15 mL, Rfl: 6   Review of Systems  Constitutional: Negative for activity change, appetite change, chills, diaphoresis, fatigue and fever.  HENT: Negative for congestion, rhinorrhea, sinus pressure, sneezing, sore throat and trouble swallowing.   Eyes: Negative for photophobia and visual disturbance.  Respiratory: Negative for cough, chest tightness, shortness of breath, wheezing and stridor.   Cardiovascular: Negative for chest pain, palpitations and leg swelling.  Gastrointestinal: Negative for abdominal distention, abdominal pain, anal bleeding, blood in stool, constipation, diarrhea, nausea and vomiting.  Genitourinary: Negative for difficulty urinating, dysuria, flank pain and hematuria.  Musculoskeletal: Negative for arthralgias, back pain, gait problem, joint swelling and myalgias.  Skin: Negative for color change, pallor, rash and wound.  Neurological: Negative for dizziness, tremors, weakness and light-headedness.  Hematological: Negative for adenopathy. Does not bruise/bleed easily.  Psychiatric/Behavioral: Negative for agitation, behavioral problems, confusion, decreased concentration, dysphoric mood, self-injury and sleep disturbance.      Observations/Objective: Walter [name] is doing quite well and has  lost a dramatic amount of weight and improved undoubtedly his blood pressure.  He continues to be highly adherent to his antiretrovirals.  He did recently suffer from COVID-19 a very mild case. Assessment and Plan:  HIV disease: [place] Biktarvy and come back in July to renew his HMA P program and get labs    Overweight: Continue to work on carbohydrate restriction along with exercise.   HTN: Given dramatic weight loss I think is not unreasonable for him to see what his blood pressure runs when he is off hydrochlorothiazide and if he is normotensive we can get rid of this medication.  COVID-19 infection: Having suffered from an actual infection I do not think that he requires vaccination though vaccination is certainly still recommended for those who have had COVID-19 infection my own personal feeling is that those who have had infection will have immunity and that given the scarcity of vaccines it would be better if someone else got the vaccine that this person got.  I however let that purely up to Walter [name] could certainly getting  vaccine  will not hurt him at all.   Follow Up Instructions:    I discussed the assessment and treatment plan with the patient. The patient was provided an opportunity to ask questions and all were answered. The patient agreed with the plan and demonstrated an understanding of the instructions.   The patient was advised to call back or seek an in-person evaluation if the symptoms worsen or if the condition fails to improve as anticipated.  I provided 21  minutes of non-face-to-face time during this encounter.   Acey Lav, MD

## 2019-12-28 ENCOUNTER — Other Ambulatory Visit: Payer: Self-pay | Admitting: Infectious Disease

## 2019-12-28 DIAGNOSIS — B20 Human immunodeficiency virus [HIV] disease: Secondary | ICD-10-CM

## 2020-02-09 ENCOUNTER — Ambulatory Visit: Payer: Self-pay

## 2020-02-14 ENCOUNTER — Ambulatory Visit: Payer: Self-pay | Attending: Internal Medicine

## 2020-02-14 DIAGNOSIS — Z23 Encounter for immunization: Secondary | ICD-10-CM

## 2020-02-14 NOTE — Progress Notes (Signed)
   Covid-19 Vaccination Clinic  Name:  Walter [name]    MRN: [id number] DOB: [birth year]  02/14/2020  [name] was observed post Covid-19 immunization for 15 minutes without incident. He was provided with Vaccine Information Sheet and instruction to access the V-Safe system.   [name] was instructed to call 911 with any severe reactions post vaccine: Marland Kitchen Difficulty breathing  . Swelling of face and throat  . A fast heartbeat  . A bad rash all over body  . Dizziness and weakness   Immunizations Administered    Name Date Dose VIS Date Route   Pfizer COVID-19 Vaccine 02/14/2020  8:53 AM 0.3 mL 11/04/2019 Intramuscular   Manufacturer: [employer], [employer]   Lot: [id number]   NDC: [address]-4

## 2020-03-07 ENCOUNTER — Ambulatory Visit: Payer: Self-pay | Attending: Internal Medicine

## 2020-03-07 DIAGNOSIS — Z23 Encounter for immunization: Secondary | ICD-10-CM

## 2020-03-07 NOTE — Progress Notes (Signed)
   Covid-19 Vaccination Clinic  Name:  Walter [name]    MRN: [id number] DOB: 05/07/1976  03/07/2020  Walter [name] was observed post Covid-19 immunization for 15 minutes without incident. He was provided with Vaccine Information Sheet and instruction to access the V-Safe system.   Walter [name] was instructed to call 911 with any severe reactions post vaccine: Marland Kitchen Difficulty breathing  . Swelling of face and throat  . A fast heartbeat  . A bad rash all over body  . Dizziness and weakness   Immunizations Administered    Name Date Dose VIS Date Route   Pfizer COVID-19 Vaccine 03/07/2020 12:02 PM 0.3 mL 11/04/2019 Intranasal   Manufacturer: [employer], [employer]   Lot: [id number]   NDC: [address]-6

## 2020-03-26 ENCOUNTER — Ambulatory Visit (INDEPENDENT_AMBULATORY_CARE_PROVIDER_SITE_OTHER): Payer: Self-pay | Admitting: Family Medicine

## 2020-03-26 ENCOUNTER — Other Ambulatory Visit: Payer: Self-pay

## 2020-03-26 ENCOUNTER — Encounter: Payer: Self-pay | Admitting: Family Medicine

## 2020-03-26 VITALS — BP 119/69 | HR 58 | Temp 98.3°F | Ht 69.5 in | Wt 239.0 lb

## 2020-03-26 DIAGNOSIS — I10 Essential (primary) hypertension: Secondary | ICD-10-CM

## 2020-03-26 DIAGNOSIS — B20 Human immunodeficiency virus [HIV] disease: Secondary | ICD-10-CM

## 2020-03-26 DIAGNOSIS — E6609 Other obesity due to excess calories: Secondary | ICD-10-CM

## 2020-03-26 DIAGNOSIS — R739 Hyperglycemia, unspecified: Secondary | ICD-10-CM

## 2020-03-26 DIAGNOSIS — R634 Abnormal weight loss: Secondary | ICD-10-CM

## 2020-03-26 DIAGNOSIS — Z6834 Body mass index (BMI) 34.0-34.9, adult: Secondary | ICD-10-CM

## 2020-03-26 DIAGNOSIS — J302 Other seasonal allergic rhinitis: Secondary | ICD-10-CM

## 2020-03-26 DIAGNOSIS — Z09 Encounter for follow-up examination after completed treatment for conditions other than malignant neoplasm: Secondary | ICD-10-CM

## 2020-03-26 LAB — POCT URINALYSIS DIPSTICK
Bilirubin, UA: NEGATIVE
Blood, UA: NEGATIVE
Glucose, UA: NEGATIVE
Ketones, UA: NEGATIVE
Leukocytes, UA: NEGATIVE
Nitrite, UA: NEGATIVE
Protein, UA: NEGATIVE
Spec Grav, UA: 1.025 (ref 1.010–1.025)
Urobilinogen, UA: 0.2 E.U./dL
pH, UA: 6 (ref 5.0–8.0)

## 2020-03-26 LAB — POCT GLYCOSYLATED HEMOGLOBIN (HGB A1C): Hemoglobin A1C: 5.3 % (ref 4.0–5.6)

## 2020-03-26 LAB — GLUCOSE, POCT (MANUAL RESULT ENTRY): POC Glucose: 122 mg/dl — AB (ref 70–99)

## 2020-03-26 NOTE — Progress Notes (Signed)
Patient [facility]   Established Patient Office Visit  Subjective:  Patient ID: Walter [name], male    DOB: [birth year]  Age: 44 y.o. MRN: [id number]  CC:  Chief Complaint  Patient presents with  . Follow-up    HTN    HPI Walter [name] is a 44 old male who presents for Follow Up today.   Past Medical History:  Diagnosis Date  . Acute esophagitis 10/08/2015  . AIDS ([place]) 10/08/2015  . COVID-19 virus infection 12/19/2019  . Essential hypertension   . Hyperglycemia 11/30/2017  . Prediabetes 02/2019  . Screen for STD (sexually transmitted disease) 01/23/2016  . Seasonal allergies   . Weight gain 12/14/2018   Current Status: Since his last office visit, he is doing well with no complaints. He has a significant weight loss since his last office visit. He denies visual changes, chest pain, cough, shortness of breath, heart palpitations, and falls. He has occasional headaches and dizziness with position changes. Denies severe headaches, confusion, seizures, double vision, and blurred vision, nausea and vomiting. He denies fevers, chills, recent infections, weight loss, and night sweats. Denies GI problems such as diarrhea, and constipation. He has no reports of blood in stools, dysuria and hematuria. No depression or anxiety reported today. He denies suicidal ideations, homicidal ideations, or auditory hallucinations. He is taking all medications as prescribed. He denies pain today.   History reviewed. No pertinent surgical history.  Family History  Problem Relation Age of Onset  . Hypertension Mother   . Diabetes Father     Social History   Socioeconomic History  . Marital status: Single    Spouse name: Not on file  . Number of children: Not on file  . Years of education: Not on file  . Highest education level: Not on file  Occupational History  . Not on file  Tobacco Use  . Smoking status: Never Smoker  . Smokeless tobacco: Never  Used  Substance and Sexual Activity  . Alcohol use: Yes    Alcohol/week: 0.0 standard drinks    Comment: Once a week.   . Drug use: No  . Sexual activity: Not Currently    Partners: Male    Comment: pt given condoms  Other Topics Concern  . Not on file  Social History Narrative  . Not on file   Social Determinants of Health   Financial Resource Strain:   . Difficulty of Paying Living Expenses:   Food Insecurity:   . Worried About Charity fundraiser in the Last Year:   . Arboriculturist in the Last Year:   Transportation Needs:   . Film/video editor (Medical):   Marland Kitchen Lack of Transportation (Non-Medical):   Physical Activity:   . Days of Exercise per Week:   . Minutes of Exercise per Session:   Stress:   . Feeling of Stress :   Social Connections:   . Frequency of Communication with Friends and Family:   . Frequency of Social Gatherings with Friends and Family:   . Attends Religious Services:   . Active Member of Clubs or Organizations:   . Attends Archivist Meetings:   Marland Kitchen Marital Status:   Intimate Partner Violence:   . Fear of Current or Ex-Partner:   . Emotionally Abused:   Marland Kitchen Physically Abused:   . Sexually Abused:     Outpatient Medications Prior to Visit  Medication Sig Dispense Refill  . bictegravir-emtricitabine-tenofovir  AF (BIKTARVY) 50-200-25 MG TABS tablet Take 1 tablet by mouth daily. 30 tablet 11  . Cyanocobalamin (VITAMIN B 12 PO) Take 1,000 mcg by mouth daily.    . fluticasone (FLONASE) 50 MCG/ACT nasal spray Place 2 sprays into both nostrils daily. 16 g 6  . hydrochlorothiazide (HYDRODIURIL) 25 MG tablet TAKE 1 TABLET BY MOUTH DAILY 30 tablet 3  . ibuprofen (ADVIL,MOTRIN) 200 MG tablet Take 400 mg by mouth every 6 (six) hours as needed for headache, mild pain or moderate pain.     Marland Kitchen levocetirizine (XYZAL) 5 MG tablet Take 1 tablet (5 mg total) by mouth every evening. 30 tablet 6  . Multiple Vitamin (MULTIVITAMIN WITH MINERALS) TABS tablet  Take 1 tablet by mouth daily.    . sodium chloride (OCEAN) 0.65 % SOLN nasal spray Place 1 spray into both nostrils as needed for congestion. 15 mL 6   No facility-administered medications prior to visit.    Allergies  Allergen Reactions  . Shrimp [Shellfish Allergy] Nausea And Vomiting    ROS Review of Systems  Constitutional: Negative.   HENT: Negative.   Eyes: Negative.   Respiratory: Negative.   Cardiovascular: Negative.   Gastrointestinal: Positive for abdominal distention.  Endocrine: Negative.   Genitourinary: Negative.   Musculoskeletal: Negative.   Allergic/Immunologic: Negative.   Neurological: Positive for dizziness (occasional ) and headaches (occasional  ).  Hematological: Negative.   Psychiatric/Behavioral: Negative.       Objective:    Physical Exam  Constitutional: He is oriented to person, place, and time. He appears well-developed and well-nourished.  HENT:  Head: Normocephalic and atraumatic.  Eyes: Conjunctivae are normal.  Cardiovascular: Normal rate, regular rhythm, normal heart sounds and intact distal pulses.  Pulmonary/Chest: Effort normal and breath sounds normal.  Abdominal: Soft. Bowel sounds are normal. He exhibits distension.  Musculoskeletal:        General: Normal range of motion.     Cervical back: Normal range of motion and neck supple.  Neurological: He is alert and oriented to person, place, and time.  Skin: Skin is warm and dry.  Psychiatric: He has a normal mood and affect. His behavior is normal. Judgment and thought content normal.  Nursing note and vitals reviewed.   BP 119/69   Pulse (!) 58   Temp 98.3 F (36.8 C)   Ht 5' 9.5" (1.765 m)   Wt 239 lb (108.4 kg)   SpO2 100%   BMI 34.79 kg/m  Wt Readings from Last 3 Encounters:  03/26/20 239 lb (108.4 kg)  09/27/19 252 lb 9.6 oz (114.6 kg)  12/14/18 269 lb (122 kg)     Health Maintenance Due  Topic Date Due  . TETANUS/TDAP  Never done    There are no  preventive care reminders to display for this patient.  Lab Results  Component Value Date   TSH 1.370 12/08/2018   Lab Results  Component Value Date   WBC 4.1 11/30/2019   HGB 15.9 11/30/2019   HCT 46.8 11/30/2019   MCV 94.0 11/30/2019   PLT 272 11/30/2019   Lab Results  Component Value Date   NA 139 11/30/2019   K 3.8 11/30/2019   CO2 27 11/30/2019   GLUCOSE 110 (H) 11/30/2019   BUN 12 11/30/2019   CREATININE 1.14 11/30/2019   BILITOT 0.5 11/30/2019   ALKPHOS 94 05/11/2017   AST 28 11/30/2019   ALT 29 11/30/2019   PROT 7.5 11/30/2019   ALBUMIN 4.1 05/11/2017   CALCIUM 9.8  11/30/2019   ANIONGAP 8 10/04/2015   Lab Results  Component Value Date   CHOL 191 06/14/2019   Lab Results  Component Value Date   HDL 43 06/14/2019   Lab Results  Component Value Date   LDLCALC 130 (H) 06/14/2019   Lab Results  Component Value Date   TRIG 82 06/14/2019   Lab Results  Component Value Date   CHOLHDL 4.4 06/14/2019   Lab Results  Component Value Date   HGBA1C 5.3 03/26/2020   Assessment & Plan:   1. Hyperglycemia - POCT glucose (manual entry) - POCT urinalysis dipstick - POCT glycosylated hemoglobin (Hb A1C)  2. Essential hypertension The current medical regimen is effective; blood pressure is stable at 119/69 today; continue present plan and medications as prescribed.   3. Weight loss He has had a 13 lbs weight loss in 6 months.   4. Class 1 obesity due to excess calories with serious comorbidity and body mass index (BMI) of 34.0 to 34.9 in adult Body mass index is 34.79 kg/m. Goal BMI  is <30. Encouraged efforts to reduce weight include engaging in physical activity as tolerated with goal of 150 minutes per week. Improve dietary choices and eat a meal regimen consistent with a Mediterranean or DASH diet. Reduce simple carbohydrates. Do not skip meals and eat healthy snacks throughout the day to avoid over-eating at dinner. Set a goal weight loss that is  achievable for you.  5. HIV  6. Seasonal allergies  7. Follow up He will follow up in 6 months.   No orders of the defined types were placed in this encounter.  Orders Placed This Encounter  Procedures  . POCT glucose (manual entry)  . POCT urinalysis dipstick  . POCT glycosylated hemoglobin (Hb A1C)    Referral Orders  No referral(s) requested today    [name],  MSN, FNP-BC  [facility] [address] [place], [place] [address] [phone]- fax  Problem List Items Addressed This Visit      Cardiovascular and Mediastinum   HTN (hypertension)     Other   Hyperglycemia - Primary   Relevant Orders   POCT glucose (manual entry) (Completed)   POCT urinalysis dipstick (Completed)   POCT glycosylated hemoglobin (Hb A1C) (Completed)   Seasonal allergies   Weight loss    Other Visit Diagnoses    Class 1 obesity due to excess calories with serious comorbidity and body mass index (BMI) of 34.0 to 34.9 in adult       Follow up          No orders of the defined types were placed in this encounter.   Follow-up: Return in about 6 months (around 09/26/2020).    Kallie Locks, FNP

## 2020-04-09 ENCOUNTER — Other Ambulatory Visit: Payer: Self-pay | Admitting: Infectious Disease

## 2020-04-09 DIAGNOSIS — B20 Human immunodeficiency virus [HIV] disease: Secondary | ICD-10-CM

## 2020-04-13 ENCOUNTER — Encounter: Payer: Self-pay | Admitting: Infectious Disease

## 2020-05-29 ENCOUNTER — Other Ambulatory Visit: Payer: Self-pay

## 2020-06-11 ENCOUNTER — Ambulatory Visit: Payer: Self-pay

## 2020-06-11 ENCOUNTER — Other Ambulatory Visit: Payer: Self-pay

## 2020-06-20 ENCOUNTER — Other Ambulatory Visit: Payer: Self-pay

## 2020-06-20 DIAGNOSIS — Z113 Encounter for screening for infections with a predominantly sexual mode of transmission: Secondary | ICD-10-CM

## 2020-06-20 DIAGNOSIS — R634 Abnormal weight loss: Secondary | ICD-10-CM

## 2020-06-20 DIAGNOSIS — B20 Human immunodeficiency virus [HIV] disease: Secondary | ICD-10-CM

## 2020-06-20 DIAGNOSIS — I10 Essential (primary) hypertension: Secondary | ICD-10-CM

## 2020-06-20 DIAGNOSIS — Z6839 Body mass index (BMI) 39.0-39.9, adult: Secondary | ICD-10-CM

## 2020-06-21 LAB — T-HELPER CELL (CD4) - (RCID CLINIC ONLY)
CD4 % Helper T Cell: 24 % — ABNORMAL LOW (ref 33–65)
CD4 T Cell Abs: 399 /uL — ABNORMAL LOW (ref 400–1790)

## 2020-06-22 ENCOUNTER — Encounter: Payer: Self-pay | Admitting: Infectious Disease

## 2020-06-25 ENCOUNTER — Ambulatory Visit: Payer: Self-pay | Admitting: Infectious Disease

## 2020-06-25 LAB — CBC WITH DIFFERENTIAL/PLATELET
Absolute Monocytes: 361 cells/uL (ref 200–950)
Basophils Absolute: 60 cells/uL (ref 0–200)
Basophils Relative: 1.7 %
Eosinophils Absolute: 49 cells/uL (ref 15–500)
Eosinophils Relative: 1.4 %
HCT: 47.8 % (ref 38.5–50.0)
Hemoglobin: 16.1 g/dL (ref 13.2–17.1)
Lymphs Abs: 1659 cells/uL (ref 850–3900)
MCH: 32.5 pg (ref 27.0–33.0)
MCHC: 33.7 g/dL (ref 32.0–36.0)
MCV: 96.4 fL (ref 80.0–100.0)
MPV: 9.5 fL (ref 7.5–12.5)
Monocytes Relative: 10.3 %
Neutro Abs: 1372 cells/uL — ABNORMAL LOW (ref 1500–7800)
Neutrophils Relative %: 39.2 %
Platelets: 265 10*3/uL (ref 140–400)
RBC: 4.96 10*6/uL (ref 4.20–5.80)
RDW: 12.9 % (ref 11.0–15.0)
Total Lymphocyte: 47.4 %
WBC: 3.5 10*3/uL — ABNORMAL LOW (ref 3.8–10.8)

## 2020-06-25 LAB — COMPLETE METABOLIC PANEL WITH GFR
AG Ratio: 1.7 (calc) (ref 1.0–2.5)
ALT: 22 U/L (ref 9–46)
AST: 25 U/L (ref 10–40)
Albumin: 4.2 g/dL (ref 3.6–5.1)
Alkaline phosphatase (APISO): 64 U/L (ref 36–130)
BUN: 12 mg/dL (ref 7–25)
CO2: 28 mmol/L (ref 20–32)
Calcium: 8.9 mg/dL (ref 8.6–10.3)
Chloride: 106 mmol/L (ref 98–110)
Creat: 1.24 mg/dL (ref 0.60–1.35)
GFR, Est African American: 82 mL/min/{1.73_m2} (ref >=60)
GFR, Est Non African American: 71 mL/min/{1.73_m2} (ref >=60)
Globulin: 2.5 g/dL (calc) (ref 1.9–3.7)
Glucose, Bld: 101 mg/dL — ABNORMAL HIGH (ref 65–99)
Potassium: 4.3 mmol/L (ref 3.5–5.3)
Sodium: 139 mmol/L (ref 135–146)
Total Bilirubin: 0.5 mg/dL (ref 0.2–1.2)
Total Protein: 6.7 g/dL (ref 6.1–8.1)

## 2020-06-25 LAB — LIPID PANEL
Cholesterol: 169 mg/dL (ref <200)
HDL: 43 mg/dL (ref >=40)
LDL Cholesterol (Calc): 106 mg/dL (calc) — ABNORMAL HIGH
Non-HDL Cholesterol (Calc): 126 mg/dL (calc) (ref <130)
Total CHOL/HDL Ratio: 3.9 (calc) (ref <5.0)
Triglycerides: 100 mg/dL (ref <150)

## 2020-06-25 LAB — RPR: RPR Ser Ql: NONREACTIVE

## 2020-06-25 LAB — HIV-1 RNA QUANT-NO REFLEX-BLD
HIV 1 RNA Quant: <20 copies/mL
HIV-1 RNA Quant, Log: <1.3 Log copies/mL

## 2020-07-04 ENCOUNTER — Other Ambulatory Visit: Payer: Self-pay

## 2020-07-04 ENCOUNTER — Encounter: Payer: Self-pay | Admitting: Infectious Disease

## 2020-07-04 ENCOUNTER — Telehealth (INDEPENDENT_AMBULATORY_CARE_PROVIDER_SITE_OTHER): Payer: HRSA Program | Admitting: Infectious Disease

## 2020-07-04 DIAGNOSIS — I10 Essential (primary) hypertension: Secondary | ICD-10-CM

## 2020-07-04 DIAGNOSIS — B20 Human immunodeficiency virus [HIV] disease: Secondary | ICD-10-CM

## 2020-07-04 DIAGNOSIS — R739 Hyperglycemia, unspecified: Secondary | ICD-10-CM

## 2020-07-04 DIAGNOSIS — R7303 Prediabetes: Secondary | ICD-10-CM

## 2020-07-04 DIAGNOSIS — R635 Abnormal weight gain: Secondary | ICD-10-CM

## 2020-07-04 DIAGNOSIS — U071 COVID-19: Secondary | ICD-10-CM

## 2020-07-04 NOTE — Progress Notes (Signed)
Virtual Visit via Video Note  I connected with [name] on 07/04/20 at  9:30 AM EDT by a video enabled telemedicine application and verified that I am speaking with the correct person using two identifiers.  Location: Patient: Home Provider: [facility]  I discussed the limitations of evaluation and management by telemedicine and the availability of in person appointments. The patient expressed understanding and agreed to proceed.  History of Present Illness:  Walter [name] is a 44 year old African-American man living with HIV that is perfectly controlled on Biktarvy.  He did have COVID-19 infection in the last year but now is also been vaccinated with 2 doses of the Pfizer vaccine.  He had prediabetes and some hypertension but now has lost nearly 50 pounds of weight through dietary modifications.  He follows with [name] for primary care.  He came in and get his blood work done here and is renewed his HIV medication assistance program.  He is in good spirits and he prefers the video visit so we will continue them going forward.  He should get a flu shot in the fall late August early September when we have them available.  View of systems as above otherwise 12 point review of systems is negative   Past Medical History:  Diagnosis Date  . Acute esophagitis 10/08/2015  . AIDS ([facility]) 10/08/2015  . COVID-19 virus infection 12/19/2019  . Essential hypertension   . Hyperglycemia 11/30/2017  . Prediabetes 02/2019  . Screen for STD (sexually transmitted disease) 01/23/2016  . Seasonal allergies   . Weight gain 12/14/2018    No past surgical history on file.  Family History  Problem Relation Age of Onset  . Hypertension Mother   . Diabetes Father       Social History   Socioeconomic History  . Marital status: Single    Spouse name: Not on file  . Number of children: Not on file  . Years of education: Not on file  . Highest education level: Not on file  Occupational History   . Not on file  Tobacco Use  . Smoking status: Never Smoker  . Smokeless tobacco: Never Used  Vaping Use  . Vaping Use: Never used  Substance and Sexual Activity  . Alcohol use: Yes    Alcohol/week: 0.0 standard drinks    Comment: Once a week.   . Drug use: No  . Sexual activity: Not Currently    Partners: Male    Comment: pt given condoms  Other Topics Concern  . Not on file  Social History Narrative  . Not on file   Social Determinants of Health   Financial Resource Strain:   . Difficulty of Paying Living Expenses:   Food Insecurity:   . Worried About Programme researcher, broadcasting/film/video in the Last Year:   . Barista in the Last Year:   Transportation Needs:   . Freight forwarder (Medical):   Marland Kitchen Lack of Transportation (Non-Medical):   Physical Activity:   . Days of Exercise per Week:   . Minutes of Exercise per Session:   Stress:   . Feeling of Stress :   Social Connections:   . Frequency of Communication with Friends and Family:   . Frequency of Social Gatherings with Friends and Family:   . Attends Religious Services:   . Active Member of Clubs or Organizations:   . Attends Banker Meetings:   Marland Kitchen Marital Status:     Allergies  Allergen Reactions  .  Shrimp [Shellfish Allergy] Nausea And Vomiting     Current Outpatient Medications:  .  bictegravir-emtricitabine-tenofovir AF (BIKTARVY) 50-200-25 MG TABS tablet, Take 1 tablet by mouth daily., Disp: 30 tablet, Rfl: 11 .  Cyanocobalamin (VITAMIN B 12 PO), Take 1,000 mcg by mouth daily., Disp: , Rfl:  .  fluticasone (FLONASE) 50 MCG/ACT nasal spray, Place 2 sprays into both nostrils daily., Disp: 16 g, Rfl: 6 .  ibuprofen (ADVIL,MOTRIN) 200 MG tablet, Take 400 mg by mouth every 6 (six) hours as needed for headache, mild pain or moderate pain. , Disp: , Rfl:  .  levocetirizine (XYZAL) 5 MG tablet, Take 1 tablet (5 mg total) by mouth every evening., Disp: 30 tablet, Rfl: 6 .  hydrochlorothiazide (HYDRODIURIL)  25 MG tablet, TAKE 1 TABLET BY MOUTH DAILY (Patient not taking: Reported on 07/04/2020), Disp: 30 tablet, Rfl: 3 .  Multiple Vitamin (MULTIVITAMIN WITH MINERALS) TABS tablet, Take 1 tablet by mouth daily., Disp: , Rfl:     Observations/Objective:  Jerid was doing well visibly comfortable on the video feed.  He is clearly taking his antiretrovirals correctly and suppressing his virus I really happy with him having lost weight to   Assessment and Plan: HIV disease continue Biktarvy check labs again on January 4 and see me later in January via video  Hypertension he is seems to have cured this through weight loss follows with [name].  Prediabetes: I will check an A1c when he comes to see me but I suspect he is got himself out of the prediabetic range as well though his blood sugar with [place] was slightly elevated above 100.  Obesity: He is lost 50 pounds through dietary modification I commended him about this once again.  Follow Up Instructions:    I discussed the assessment and treatment plan with the patient. The patient was provided an opportunity to ask questions and all were answered. The patient agreed with the plan and demonstrated an understanding of the instructions.   The patient was advised to call back or seek an in-person evaluation if the symptoms worsen or if the condition fails to improve as anticipated.    Acey Lav, MD

## 2020-09-14 ENCOUNTER — Ambulatory Visit: Payer: Self-pay

## 2020-09-26 ENCOUNTER — Other Ambulatory Visit: Payer: Self-pay

## 2020-09-26 ENCOUNTER — Ambulatory Visit (INDEPENDENT_AMBULATORY_CARE_PROVIDER_SITE_OTHER): Payer: Self-pay | Admitting: Family Medicine

## 2020-09-26 ENCOUNTER — Encounter: Payer: Self-pay | Admitting: Family Medicine

## 2020-09-26 VITALS — BP 129/81 | HR 65 | Temp 98.3°F | Resp 17 | Ht 69.5 in | Wt 235.6 lb

## 2020-09-26 DIAGNOSIS — Z6834 Body mass index (BMI) 34.0-34.9, adult: Secondary | ICD-10-CM

## 2020-09-26 DIAGNOSIS — Z09 Encounter for follow-up examination after completed treatment for conditions other than malignant neoplasm: Secondary | ICD-10-CM

## 2020-09-26 DIAGNOSIS — E6609 Other obesity due to excess calories: Secondary | ICD-10-CM

## 2020-09-26 DIAGNOSIS — Z0000 Encounter for general adult medical examination without abnormal findings: Secondary | ICD-10-CM

## 2020-09-26 DIAGNOSIS — I10 Essential (primary) hypertension: Secondary | ICD-10-CM

## 2020-09-26 DIAGNOSIS — Z23 Encounter for immunization: Secondary | ICD-10-CM

## 2020-09-26 DIAGNOSIS — R634 Abnormal weight loss: Secondary | ICD-10-CM

## 2020-09-26 DIAGNOSIS — B20 Human immunodeficiency virus [HIV] disease: Secondary | ICD-10-CM

## 2020-09-26 LAB — POCT URINALYSIS DIPSTICK
Bilirubin, UA: NEGATIVE
Blood, UA: NEGATIVE
Glucose, UA: NEGATIVE
Ketones, UA: NEGATIVE
Leukocytes, UA: NEGATIVE
Nitrite, UA: NEGATIVE
Protein, UA: NEGATIVE
Spec Grav, UA: 1.02 (ref 1.010–1.025)
Urobilinogen, UA: 0.2 E.U./dL
pH, UA: 5.5 (ref 5.0–8.0)

## 2020-09-26 NOTE — Progress Notes (Signed)
[facility]   Established Patient Office Visit  Subjective:  Patient ID: Walter [name], male    DOB: 1976/05/04  Age: 44 y.o. MRN: [id number]  CC:  Chief Complaint  Patient presents with  . Follow-up    HPI Walter [name] is a 44 year old male who presents for Follow Up today.    Patient Active Problem List   Diagnosis Date Noted  . COVID-19 virus infection 12/19/2019  . Weight loss 09/27/2019  . Prediabetes 09/27/2019  . Class 2 severe obesity due to excess calories with serious comorbidity and body mass index (BMI) of 39.0 to 39.9 in adult [facility]) 03/17/2019  . Seasonal allergies 03/17/2019  . Weight gain 12/14/2018  . Hyperglycemia 11/30/2017  . HTN (hypertension) 01/23/2016  . Screen for STD (sexually transmitted disease) 01/23/2016  . HIV disease (HCC) 10/08/2015  . Acute esophagitis 10/08/2015    Current Status: Since his last office visit, he is doing well with no complaints. He denies fevers, chills, fatigue, recent infections, weight loss, and night sweats. He has not had any headaches, visual changes, dizziness, and falls. No chest pain, heart palpitations, cough and shortness of breath reported. Denies GI problems such as nausea, vomiting, diarrhea, and constipation. He has no reports of blood in stools, dysuria and hematuria. No depression or anxiety, and denies suicidal ideations, homicidal ideations, or auditory hallucinations. He is taking all medications as prescribed. He denies pain today.   Past Medical History:  Diagnosis Date  . Acute esophagitis 10/08/2015  . AIDS (HCC) 10/08/2015  . COVID-19 virus infection 12/19/2019  . Essential hypertension   . Hyperglycemia 11/30/2017  . Prediabetes 02/2019  . Screen for STD (sexually transmitted disease) 01/23/2016  . Seasonal allergies   . Weight gain 12/14/2018    No past surgical history on file.  Family History  Problem Relation Age of Onset  .  Hypertension Mother   . Diabetes Father     Social History   Socioeconomic History  . Marital status: Single    Spouse name: Not on file  . Number of children: Not on file  . Years of education: Not on file  . Highest education level: Not on file  Occupational History  . Not on file  Tobacco Use  . Smoking status: Never Smoker  . Smokeless tobacco: Never Used  Vaping Use  . Vaping Use: Never used  Substance and Sexual Activity  . Alcohol use: Yes    Alcohol/week: 0.0 standard drinks    Comment: Once a week.   . Drug use: No  . Sexual activity: Not Currently    Partners: Male    Comment: pt given condoms  Other Topics Concern  . Not on file  Social History Narrative  . Not on file   Social Determinants of Health   Financial Resource Strain:   . Difficulty of Paying Living Expenses: Not on file  Food Insecurity:   . Worried About Programme researcher, broadcasting/film/video in the Last Year: Not on file  . Ran Out of Food in the Last Year: Not on file  Transportation Needs:   . Lack of Transportation (Medical): Not on file  . Lack of Transportation (Non-Medical): Not on file  Physical Activity:   . Days of Exercise per Week: Not on file  . Minutes of Exercise per Session: Not on file  Stress:   . Feeling of Stress : Not on file  Social Connections:   .  Frequency of Communication with Friends and Family: Not on file  . Frequency of Social Gatherings with Friends and Family: Not on file  . Attends Religious Services: Not on file  . Active Member of Clubs or Organizations: Not on file  . Attends Banker Meetings: Not on file  . Marital Status: Not on file  Intimate Partner Violence:   . Fear of Current or Ex-Partner: Not on file  . Emotionally Abused: Not on file  . Physically Abused: Not on file  . Sexually Abused: Not on file    Outpatient Medications Prior to Visit  Medication Sig Dispense Refill  . bictegravir-emtricitabine-tenofovir AF (BIKTARVY) 50-200-25 MG TABS  tablet Take 1 tablet by mouth daily. 30 tablet 11  . Cyanocobalamin (VITAMIN B 12 PO) Take 1,000 mcg by mouth daily.    . fluticasone (FLONASE) 50 MCG/ACT nasal spray Place 2 sprays into both nostrils daily. 16 g 6  . ibuprofen (ADVIL,MOTRIN) 200 MG tablet Take 400 mg by mouth every 6 (six) hours as needed for headache, mild pain or moderate pain.     Marland Kitchen levocetirizine (XYZAL) 5 MG tablet Take 1 tablet (5 mg total) by mouth every evening. 30 tablet 6  . Multiple Vitamin (MULTIVITAMIN WITH MINERALS) TABS tablet Take 1 tablet by mouth daily.    . hydrochlorothiazide (HYDRODIURIL) 25 MG tablet TAKE 1 TABLET BY MOUTH DAILY (Patient not taking: Reported on 07/04/2020) 30 tablet 3   No facility-administered medications prior to visit.    Allergies  Allergen Reactions  . Shrimp [Shellfish Allergy] Nausea And Vomiting    ROS Review of Systems  Constitutional: Negative.   HENT: Negative.   Eyes: Negative.   Respiratory: Negative.   Cardiovascular: Negative.   Gastrointestinal: Negative.   Endocrine: Negative.   Genitourinary: Negative.   Musculoskeletal: Negative.   Skin: Negative.   Allergic/Immunologic: Negative.   Neurological: Negative.   Hematological: Negative.   Psychiatric/Behavioral: Negative.        Physical Exam Vitals and nursing note reviewed.  Constitutional:      Appearance: Normal appearance.  HENT:     Head: Normocephalic and atraumatic.     Nose: Nose normal.  Cardiovascular:     Rate and Rhythm: Normal rate and regular rhythm.     Pulses: Normal pulses.     Heart sounds: Normal heart sounds.  Pulmonary:     Effort: Pulmonary effort is normal.     Breath sounds: Normal breath sounds.  Musculoskeletal:        General: Normal range of motion.     Cervical back: Normal range of motion and neck supple.  Skin:    General: Skin is warm and dry.  Neurological:     General: No focal deficit present.     Mental Status: He is alert and oriented to person, place,  and time.    BP 129/81 (BP Location: Left Arm, Patient Position: Sitting, Cuff Size: Large)   Pulse 65   Temp 98.3 F (36.8 C)   Resp 17   Wt 235 lb 9.6 oz (106.9 kg)   SpO2 100%   BMI 34.29 kg/m  Wt Readings from Last 3 Encounters:  09/26/20 235 lb 9.6 oz (106.9 kg)  03/26/20 239 lb (108.4 kg)  09/27/19 252 lb 9.6 oz (114.6 kg)     Health Maintenance Due  Topic Date Due  . TETANUS/TDAP  Never done    There are no preventive care reminders to display for this patient.  Lab Results  Component Value Date   TSH 1.370 12/08/2018   Lab Results  Component Value Date   WBC 3.5 (L) 06/20/2020   HGB 16.1 06/20/2020   HCT 47.8 06/20/2020   MCV 96.4 06/20/2020   PLT 265 06/20/2020   Lab Results  Component Value Date   NA 139 06/20/2020   K 4.3 06/20/2020   CO2 28 06/20/2020   GLUCOSE 101 (H) 06/20/2020   BUN 12 06/20/2020   CREATININE 1.24 06/20/2020   BILITOT 0.5 06/20/2020   ALKPHOS 94 05/11/2017   AST 25 06/20/2020   ALT 22 06/20/2020   PROT 6.7 06/20/2020   ALBUMIN 4.1 05/11/2017   CALCIUM 8.9 06/20/2020   ANIONGAP 8 10/04/2015   Lab Results  Component Value Date   CHOL 169 06/20/2020   Lab Results  Component Value Date   HDL 43 06/20/2020   Lab Results  Component Value Date   LDLCALC 106 (H) 06/20/2020   Lab Results  Component Value Date   TRIG 100 06/20/2020   Lab Results  Component Value Date   CHOLHDL 3.9 06/20/2020   Lab Results  Component Value Date   HGBA1C 5.3 03/26/2020   Assessment & Plan:   1. Essential hypertension The current medical regimen is effective; blood pressure is stable at 129/81 today; continue present plan and medications as prescribed. He will continue to take medications as prescribed, to decrease high sodium intake, excessive alcohol intake, increase potassium intake, smoking cessation, and increase physical activity of at least 30 minutes of cardio activity daily. He will continue to follow Heart Healthy or DASH  diet. - Urinalysis Dipstick - Flu Vaccine QUAD 6+ mos PF IM (Fluarix Quad PF)  2. Class 1 obesity due to excess calories with serious comorbidity and body mass index (BMI) of 34.0 to 34.9 in adult Body mass index is 34.29 kg/m. Goal BMI  is <30. Encouraged efforts to reduce weight include engaging in physical activity as tolerated with goal of 150 minutes per week. Improve dietary choices and eat a meal regimen consistent with a Mediterranean or DASH diet. Reduce simple carbohydrates. Do not skip meals and eat healthy snacks throughout the day to avoid over-eating at dinner. Set a goal weight loss that is achievable for you.  3. Weight loss He has lost a total of 40 lbs over the past 3 years, with a total of 18 lbs in the past year.   4. HIV disease (HCC) Stable. He will continue to take anti-viral medications as prescribed and follow up with Infection Disease as needed.   5. Healthcare maintenance - Vitamin D, 25-hydroxy - Vitamin B12  6. Need for influenza vaccination Influenza vaccine given today.   7. Follow up He will follow up in 1 year for Annual Physical and Labs.   No orders of the defined types were placed in this encounter.   Orders Placed This Encounter  Procedures  . Flu Vaccine QUAD 6+ mos PF IM (Fluarix Quad PF)  . Vitamin D, 25-hydroxy  . Vitamin B12  . Urinalysis Dipstick    Referral Orders  No referral(s) requested today    [name],  MSN, FNP-BC [facility] [address] [place], [place] [address] [phone] fax  Problem List Items Addressed This Visit      Other   HIV disease (HCC)   Weight loss    Other Visit Diagnoses    Essential hypertension    -  Primary   Relevant Orders   Urinalysis Dipstick (Completed)   Flu Vaccine QUAD 6+ mos PF IM (Fluarix Quad PF) (Completed)   Class 1 obesity due to excess calories with serious  comorbidity and body mass index (BMI) of 34.0 to 34.9 in adult       Healthcare maintenance       Relevant Orders   Vitamin D, 25-hydroxy   Vitamin B12   Need for influenza vaccination       Follow up          No orders of the defined types were placed in this encounter.   Follow-up: No follow-ups on file.    Kallie LocksNatalie M Torence Palmeri, FNP

## 2020-10-01 ENCOUNTER — Encounter: Payer: Self-pay | Admitting: Family Medicine

## 2020-10-26 ENCOUNTER — Other Ambulatory Visit: Payer: Self-pay

## 2020-10-26 ENCOUNTER — Ambulatory Visit (INDEPENDENT_AMBULATORY_CARE_PROVIDER_SITE_OTHER): Payer: Self-pay

## 2020-10-26 DIAGNOSIS — Z23 Encounter for immunization: Secondary | ICD-10-CM

## 2020-10-26 NOTE — Progress Notes (Signed)
   Covid-19 Vaccination Clinic  Name:  Walter [name]    MRN: [id number] DOB: 1976-06-03  10/26/2020  Mr. Swim was observed post Covid-19 immunization for 15 minutes without incident. He was provided with Vaccine Information Sheet and instruction to access the V-Safe system.   [name] was instructed to call 911 with any severe reactions post vaccine: Marland Kitchen Difficulty breathing  . Swelling of face and throat  . A fast heartbeat  . A bad rash all over body  . Dizziness and weakness   Immunizations Administered    Name Date Dose VIS Date Route   Pfizer COVID-19 Vaccine 10/26/2020  8:58 AM 0.3 mL 09/12/2020 Intramuscular   Manufacturer: [employer], [employer]   Lot: [id number]   NDC: [address]-5    Jalonda Antigua T Pricilla Loveless

## 2020-11-09 ENCOUNTER — Other Ambulatory Visit: Payer: Self-pay

## 2020-11-09 ENCOUNTER — Encounter: Payer: Self-pay | Admitting: Family Medicine

## 2020-11-09 ENCOUNTER — Ambulatory Visit (INDEPENDENT_AMBULATORY_CARE_PROVIDER_SITE_OTHER): Payer: Self-pay | Admitting: Family Medicine

## 2020-11-09 VITALS — BP 135/86 | HR 60 | Temp 97.4°F | Ht 69.5 in | Wt 228.0 lb

## 2020-11-09 DIAGNOSIS — Z09 Encounter for follow-up examination after completed treatment for conditions other than malignant neoplasm: Secondary | ICD-10-CM

## 2020-11-09 DIAGNOSIS — S63501A Unspecified sprain of right wrist, initial encounter: Secondary | ICD-10-CM

## 2020-11-09 DIAGNOSIS — M25531 Pain in right wrist: Secondary | ICD-10-CM

## 2020-11-09 NOTE — Progress Notes (Signed)
[facility]  Sick Visit  Subjective:  Patient ID: Walter [name], male    DOB: 27-Oct-1976  Age: 44 y.o. MRN: [id number]  CC:  Chief Complaint  Patient presents with  . Wrist Pain    Right wrist pain, onset Wednesday    HPI Walter [name] is a 44 year old male who presents for Sick Visit today.    Patient Active Problem List   Diagnosis Date Noted  . COVID-19 virus infection 12/19/2019  . Weight loss 09/27/2019  . Prediabetes 09/27/2019  . Class 2 severe obesity due to excess calories with serious comorbidity and body mass index (BMI) of 39.0 to 39.9 in adult [facility]) 03/17/2019  . Seasonal allergies 03/17/2019  . Weight gain 12/14/2018  . Hyperglycemia 11/30/2017  . HTN (hypertension) 01/23/2016  . Screen for STD (sexually transmitted disease) 01/23/2016  . HIV disease (HCC) 10/08/2015  . Acute esophagitis 10/08/2015    Current Status: Since his last office visit, he has c/o right wrist pain, since injuring his wrist 2 days ago. He has been using wrist brace and taking Motrin for aid in his pain relief. He states that swelling and pain has significantly reduced. He denies visual changes, chest pain, cough, shortness of breath, heart palpitations, and falls. He has occasional headaches and dizziness with position changes. Denies severe headaches, confusion, seizures, double vision, and blurred vision, nausea and vomiting. He denies fatigue, frequent urination, blurred vision, excessive hunger, excessive thirst, weight gain, weight loss, and poor wound healing. He continues to check his feet regularly. He denies fevers, chills, recent infections, weight loss, and night sweats. Denies GI problems such as diarrhea, and constipation. He has no reports of blood in stools, dysuria and hematuria. No depression or anxiety reported today. He is taking all medications as prescribed.   Past Medical History:  Diagnosis Date  . Acute  esophagitis 10/08/2015  . AIDS (HCC) 10/08/2015  . COVID-19 virus infection 12/19/2019  . Essential hypertension   . Hyperglycemia 11/30/2017  . Prediabetes 02/2019  . Screen for STD (sexually transmitted disease) 01/23/2016  . Seasonal allergies   . Weight gain 12/14/2018    No past surgical history on file.  Family History  Problem Relation Age of Onset  . Hypertension Mother   . Diabetes Father     Social History   Socioeconomic History  . Marital status: Single    Spouse name: Not on file  . Number of children: Not on file  . Years of education: Not on file  . Highest education level: Not on file  Occupational History  . Not on file  Tobacco Use  . Smoking status: Never Smoker  . Smokeless tobacco: Never Used  Vaping Use  . Vaping Use: Never used  Substance and Sexual Activity  . Alcohol use: Yes    Alcohol/week: 0.0 standard drinks    Comment: Once a week.   . Drug use: No  . Sexual activity: Not Currently    Partners: Male    Comment: pt given condoms  Other Topics Concern  . Not on file  Social History Narrative  . Not on file   Social Determinants of Health   Financial Resource Strain: Not on file  Food Insecurity: Not on file  Transportation Needs: Not on file  Physical Activity: Not on file  Stress: Not on file  Social Connections: Not on file  Intimate Partner Violence: Not on file    Outpatient  Medications Prior to Visit  Medication Sig Dispense Refill  . bictegravir-emtricitabine-tenofovir AF (BIKTARVY) 50-200-25 MG TABS tablet Take 1 tablet by mouth daily. 30 tablet 11  . Cyanocobalamin (VITAMIN B 12 PO) Take 1,000 mcg by mouth daily.    . fluticasone (FLONASE) 50 MCG/ACT nasal spray Place 2 sprays into both nostrils daily. 16 g 6  . hydrochlorothiazide (HYDRODIURIL) 25 MG tablet TAKE 1 TABLET BY MOUTH DAILY (Patient not taking: Reported on 07/04/2020) 30 tablet 3  . ibuprofen (ADVIL,MOTRIN) 200 MG tablet Take 400 mg by mouth every 6 (six) hours  as needed for headache, mild pain or moderate pain.     Marland Kitchen levocetirizine (XYZAL) 5 MG tablet Take 1 tablet (5 mg total) by mouth every evening. 30 tablet 6  . Multiple Vitamin (MULTIVITAMIN WITH MINERALS) TABS tablet Take 1 tablet by mouth daily.     No facility-administered medications prior to visit.    Allergies  Allergen Reactions  . Shrimp [Shellfish Allergy] Nausea And Vomiting    ROS Review of Systems  Constitutional: Negative.   HENT: Negative.   Eyes: Negative.   Respiratory: Negative.   Cardiovascular: Negative.   Gastrointestinal: Negative.   Endocrine: Negative.   Genitourinary: Negative.   Musculoskeletal: Positive for arthralgias (generalized ).       Right wrist pain  Skin: Negative.   Allergic/Immunologic: Negative.   Neurological: Positive for dizziness (occasional ) and headaches (occasional ).  Hematological: Negative.   Psychiatric/Behavioral: Negative.      Objective:    Physical Exam Vitals and nursing note reviewed.  Constitutional:      Appearance: Normal appearance.  HENT:     Head: Normocephalic and atraumatic.     Nose: Nose normal.     Mouth/Throat:     Mouth: Mucous membranes are moist.     Pharynx: Oropharynx is clear.  Cardiovascular:     Rate and Rhythm: Normal rate and regular rhythm.     Pulses: Normal pulses.     Heart sounds: Normal heart sounds.  Pulmonary:     Effort: Pulmonary effort is normal.     Breath sounds: Normal breath sounds.  Abdominal:     General: Bowel sounds are normal.  Musculoskeletal:        General: Normal range of motion.     Cervical back: Normal range of motion and neck supple.  Skin:    General: Skin is warm and dry.  Neurological:     General: No focal deficit present.     Mental Status: He is alert and oriented to person, place, and time.  Psychiatric:        Mood and Affect: Mood normal.        Behavior: Behavior normal.        Thought Content: Thought content normal.    BP 135/86    Pulse 60   Temp (!) 97.4 F (36.3 C) (Temporal)   Ht 5' 9.5" (1.765 m)   Wt 228 lb (103.4 kg)   SpO2 100%   BMI 33.19 kg/m  Wt Readings from Last 3 Encounters:  11/09/20 228 lb (103.4 kg)  09/26/20 235 lb 9.6 oz (106.9 kg)  03/26/20 239 lb (108.4 kg)     Health Maintenance Due  Topic Date Due  . TETANUS/TDAP  Never done    There are no preventive care reminders to display for this patient.  Lab Results  Component Value Date   TSH 1.370 12/08/2018   Lab Results  Component Value Date  WBC 3.5 (L) 06/20/2020   HGB 16.1 06/20/2020   HCT 47.8 06/20/2020   MCV 96.4 06/20/2020   PLT 265 06/20/2020   Lab Results  Component Value Date   NA 139 06/20/2020   K 4.3 06/20/2020   CO2 28 06/20/2020   GLUCOSE 101 (H) 06/20/2020   BUN 12 06/20/2020   CREATININE 1.24 06/20/2020   BILITOT 0.5 06/20/2020   ALKPHOS 94 05/11/2017   AST 25 06/20/2020   ALT 22 06/20/2020   PROT 6.7 06/20/2020   ALBUMIN 4.1 05/11/2017   CALCIUM 8.9 06/20/2020   ANIONGAP 8 10/04/2015   Lab Results  Component Value Date   CHOL 169 06/20/2020   Lab Results  Component Value Date   HDL 43 06/20/2020   Lab Results  Component Value Date   LDLCALC 106 (H) 06/20/2020   Lab Results  Component Value Date   TRIG 100 06/20/2020   Lab Results  Component Value Date   CHOLHDL 3.9 06/20/2020   Lab Results  Component Value Date   HGBA1C 5.3 03/26/2020      Assessment & Plan:   1. Sprain of right wrist, initial encounter Improved. Stable today. He will continue Motrin and using RICE methods for aid in pain relief. Apply a compressive ACE bandage and use wrist brace as needed. Rest and elevate the affected painful area.  Apply cold compresses intermittently as needed.  As pain recedes, begin normal activities slowly as tolerated. Call if symptoms persist.  2. Acute wrist pain, right  3. Follow up He will keep follow up appointment as scheduled.   No orders of the defined types were placed  in this encounter.   No orders of the defined types were placed in this encounter.   Referral Orders  No referral(s) requested today    [name], MSN, ANE, FNP-BC [facility] [address] [place], [place] [address] [phone] fax  Problem List Items Addressed This Visit   None   Visit Diagnoses    Sprain of right wrist, initial encounter    -  Primary   Acute wrist pain, right       Follow up          No orders of the defined types were placed in this encounter.   Follow-up: No follow-ups on file.    Kallie Locks, FNP

## 2020-11-09 NOTE — Patient Instructions (Signed)
 Wrist Sprain, Adult A wrist sprain is a stretch or tear in the strong, fibrous tissues (ligaments) that connect your wrist bones. There are three types of wrist sprains:  Grade 1. In this type of sprain, the ligament is stretched more than normal.  Grade 2. In this type of sprain, the ligament is partially torn. You may be able to move your wrist, but not very much.  Grade 3. In this type of sprain, the ligament or muscle is completely torn. You may find it difficult or extremely painful to move your wrist even a little. What are the causes? A wrist sprain can be caused by using the wrist too much during sports, exercise, or at work. It can also happen with a fall or during an accident. What increases the risk? This condition is more likely to occur in people:  With a previous wrist or arm injury.  With poor wrist strength and flexibility.  Who play contact sports, such as football or soccer.  Who play sports that may result in a fall, such as skateboarding, biking, skiing, or snowboarding.  Who do not exercise regularly.  Who use exercise equipment that does not fit well. What are the signs or symptoms? Symptoms of this condition include:  Pain in the wrist, arm, or hand.  Swelling or bruised skin near the wrist, hand, or arm. The skin may look yellow or kind of blue.  Stiffness or trouble moving the hand.  Hearing a pop or feeling a tear at the time of the injury.  A warm feeling in the skin around the wrist. How is this diagnosed? This condition is diagnosed with a physical exam. Sometimes an X-ray is taken to make sure a bone did not break. If your health care provider thinks that you tore a ligament, he or she may order an MRI of your wrist. How is this treated? This condition is treated by resting and applying ice to your wrist. Additional treatment may include:  Medicine for pain and inflammation.  A splint to keep your wrist still (immobilized).  Exercises to  strengthen and stretch your wrist.  Surgery. This may be done if the ligament is completely torn. Follow these instructions at home: If you have a splint:   Do not put pressure on any part of the splint until it is fully hardened. This may take several hours.  Wear the splint as told by your health care provider. Remove it only as told by your health care provider.  Loosen the splint if your fingers tingle, become numb, or turn cold and blue.  If your splint is not waterproof: ? Do not let it get wet. ? Cover it with a watertight covering when you take a bath or a shower.  Keep the splint clean. Managing pain, stiffness, and swelling   If directed, put ice on the injured area. ? If you have a removable splint, remove it as told by your health care provider. ? Put ice in a plastic bag. ? Place a towel between your skin and the bag or between the splint and the bag. ? Leave the ice on for 20 minutes, 2-3 times per day.  Move your fingers often to avoid stiffness and to lessen swelling.  Raise (elevate) the injured area above the level of your heart while you are sitting or lying down. Activity  Rest your wrist. Do not do things that cause pain.  Return to your normal activities as told by your health care   provider. Ask your health care provider what activities are safe for you.  Do exercises as told by your health care provider. General instructions  Take over-the-counter and prescription medicines only as told by your health care provider.  Do not use any products that contain nicotine or tobacco, such as cigarettes and e-cigarettes. These can delay healing. If you need help quitting, ask your health care provider.  Ask your health care provider when it is safe to drive if you have a splint.  Keep all follow-up visits as told by your health care provider. This is important. Contact a health care provider if:  Your pain, bruising, or swelling gets worse.  Your skin  becomes red, gets a rash, or has open sores.  Your pain does not get better or it gets worse. Get help right away if:  You have a new or sudden sharp pain in the hand, arm, or wrist.  You have tingling or numbness in your hand.  Your fingers turn white, very red, or cold and blue.  You cannot move your fingers. This information is not intended to replace advice given to you by your health care provider. Make sure you discuss any questions you have with your health care provider. Document Revised: 10/23/2017 Document Reviewed: 05/29/2016 Elsevier Patient Education  2020 [employer]  

## 2020-11-11 ENCOUNTER — Encounter: Payer: Self-pay | Admitting: Family Medicine

## 2020-11-27 ENCOUNTER — Other Ambulatory Visit: Payer: Self-pay

## 2020-11-27 ENCOUNTER — Ambulatory Visit: Payer: Self-pay

## 2020-11-27 DIAGNOSIS — B20 Human immunodeficiency virus [HIV] disease: Secondary | ICD-10-CM

## 2020-11-28 LAB — T-HELPER CELL (CD4) - (RCID CLINIC ONLY)
CD4 % Helper T Cell: 25 % — ABNORMAL LOW (ref 33–65)
CD4 T Cell Abs: 310 /uL — ABNORMAL LOW (ref 400–1790)

## 2020-11-30 ENCOUNTER — Telehealth: Payer: Self-pay | Admitting: Family Medicine

## 2020-11-30 ENCOUNTER — Telehealth: Payer: Self-pay

## 2020-11-30 NOTE — Telephone Encounter (Signed)
Patient called office today requesting medication for thrush. States he noticed some white spots on his tongue today. Had a cold this week and feels like thrush is a result of that. Requested patient contact PCP today as well for presecretion. Pt would like prescription to be sent to [facility] on [address].  Walter [name], [place]

## 2020-12-01 ENCOUNTER — Other Ambulatory Visit: Payer: Self-pay | Admitting: Family Medicine

## 2020-12-01 DIAGNOSIS — B370 Candidal stomatitis: Secondary | ICD-10-CM

## 2020-12-01 MED ORDER — NYSTATIN 100000 UNIT/ML MT SUSP: 5.0000 mL | Freq: Four times a day (QID) | OROMUCOSAL | 1 refills | Status: DC

## 2020-12-01 NOTE — Telephone Encounter (Signed)
Done

## 2020-12-03 ENCOUNTER — Telehealth: Payer: Self-pay | Admitting: Family Medicine

## 2020-12-03 NOTE — Telephone Encounter (Signed)
His CD4 is not as low as I would expect with thrush. Does he have appt coming up?

## 2020-12-03 NOTE — Telephone Encounter (Signed)
Medication called in on 12/01/2020.

## 2020-12-05 LAB — HIV-1 RNA QUANT-NO REFLEX-BLD
HIV 1 RNA Quant: <20 Copies/mL
HIV-1 RNA Quant, Log: <1.3 Log cps/mL

## 2020-12-05 LAB — CBC WITH DIFFERENTIAL/PLATELET
Absolute Monocytes: 496 cells/uL (ref 200–950)
Basophils Absolute: 29 cells/uL (ref 0–200)
Basophils Relative: 0.9 %
Eosinophils Absolute: 42 cells/uL (ref 15–500)
Eosinophils Relative: 1.3 %
HCT: 45 % (ref 38.5–50.0)
Hemoglobin: 15.3 g/dL (ref 13.2–17.1)
Lymphs Abs: 1139 cells/uL (ref 850–3900)
MCH: 32.5 pg (ref 27.0–33.0)
MCHC: 34 g/dL (ref 32.0–36.0)
MCV: 95.5 fL (ref 80.0–100.0)
MPV: 9.6 fL (ref 7.5–12.5)
Monocytes Relative: 15.5 %
Neutro Abs: 1494 cells/uL — ABNORMAL LOW (ref 1500–7800)
Neutrophils Relative %: 46.7 %
Platelets: 227 10*3/uL (ref 140–400)
RBC: 4.71 10*6/uL (ref 4.20–5.80)
RDW: 12.7 % (ref 11.0–15.0)
Total Lymphocyte: 35.6 %
WBC: 3.2 10*3/uL — ABNORMAL LOW (ref 3.8–10.8)

## 2020-12-05 LAB — COMPLETE METABOLIC PANEL WITH GFR
AG Ratio: 1.7 (calc) (ref 1.0–2.5)
ALT: 20 U/L (ref 9–46)
AST: 23 U/L (ref 10–40)
Albumin: 4.2 g/dL (ref 3.6–5.1)
Alkaline phosphatase (APISO): 84 U/L (ref 36–130)
BUN: 9 mg/dL (ref 7–25)
CO2: 29 mmol/L (ref 20–32)
Calcium: 8.8 mg/dL (ref 8.6–10.3)
Chloride: 109 mmol/L (ref 98–110)
Creat: 1.06 mg/dL (ref 0.60–1.35)
GFR, Est African American: 99 mL/min/{1.73_m2} (ref >=60)
GFR, Est Non African American: 86 mL/min/{1.73_m2} (ref >=60)
Globulin: 2.5 g/dL (calc) (ref 1.9–3.7)
Glucose, Bld: 88 mg/dL (ref 65–99)
Potassium: 4.1 mmol/L (ref 3.5–5.3)
Sodium: 142 mmol/L (ref 135–146)
Total Bilirubin: 0.5 mg/dL (ref 0.2–1.2)
Total Protein: 6.7 g/dL (ref 6.1–8.1)

## 2020-12-05 LAB — LIPID PANEL
Cholesterol: 143 mg/dL (ref <200)
HDL: 39 mg/dL — ABNORMAL LOW (ref >=40)
LDL Cholesterol (Calc): 85 mg/dL (calc)
Non-HDL Cholesterol (Calc): 104 mg/dL (calc) (ref <130)
Total CHOL/HDL Ratio: 3.7 (calc) (ref <5.0)
Triglycerides: 97 mg/dL (ref <150)

## 2020-12-05 LAB — RPR: RPR Ser Ql: NONREACTIVE

## 2020-12-05 LAB — HEMOGLOBIN A1C
Hgb A1c MFr Bld: 5.4 % of total Hgb (ref <5.7)
Mean Plasma Glucose: 108 mg/dL
eAG (mmol/L): 6 mmol/L

## 2020-12-19 ENCOUNTER — Other Ambulatory Visit: Payer: Self-pay

## 2020-12-19 ENCOUNTER — Telehealth (INDEPENDENT_AMBULATORY_CARE_PROVIDER_SITE_OTHER): Payer: Self-pay | Admitting: Infectious Disease

## 2020-12-19 ENCOUNTER — Encounter: Payer: Self-pay | Admitting: Infectious Disease

## 2020-12-19 DIAGNOSIS — R7303 Prediabetes: Secondary | ICD-10-CM

## 2020-12-19 DIAGNOSIS — Z6839 Body mass index (BMI) 39.0-39.9, adult: Secondary | ICD-10-CM

## 2020-12-19 DIAGNOSIS — I10 Essential (primary) hypertension: Secondary | ICD-10-CM

## 2020-12-19 DIAGNOSIS — R635 Abnormal weight gain: Secondary | ICD-10-CM

## 2020-12-19 DIAGNOSIS — B20 Human immunodeficiency virus [HIV] disease: Secondary | ICD-10-CM

## 2020-12-19 DIAGNOSIS — U071 COVID-19: Secondary | ICD-10-CM

## 2020-12-19 DIAGNOSIS — R739 Hyperglycemia, unspecified: Secondary | ICD-10-CM

## 2020-12-19 NOTE — Progress Notes (Signed)
Virtual Visit via Video Note  I connected with Walter [name] on 12/19/20 at  1:45 PM EST by a video enabled telemedicine application and verified that I am speaking with the correct person using two identifiers.  Location: Patient: Home Provider: [facility]  I discussed the limitations of evaluation and management by telemedicine and the availability of in person appointments. The patient expressed understanding and agreed to proceed.  History of Present Illness:  [name] is a 45 year old African-American man living with HIV that is perfectly controlled on Biktarvy.  He did have COVID-19 infection in the last year but now is also been vaccinated with 3 doses of the Pfizer vaccine.  He had prediabetes and some hypertension but now has lost nearly 50 pounds of weight through dietary modifications.  He follows with [name] for primary care.  He has since gotten his influenza shot.  He has lost further weight having lost at least 50 pounds.  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  He is quite happy with the BIKTARVY takes daily and remains virologically suppressed  Labs pertinent data were reviewed  View of systems as above otherwise 12 point review of systems is negative   Past Medical History:  Diagnosis Date  . Acute esophagitis 10/08/2015  . AIDS (HCC) 10/08/2015  . COVID-19 virus infection 12/19/2019  . Essential hypertension   . Hyperglycemia 11/30/2017  . Prediabetes 02/2019  . Screen for STD (sexually transmitted disease) 01/23/2016  . Seasonal allergies   . Weight gain 12/14/2018    No past surgical history on file.  Family History  Problem Relation Age of Onset  . Hypertension Mother   . Diabetes Father       Social History   Socioeconomic History  . Marital status: Single    Spouse name: Not on file  . Number of children: Not  on file  . Years of education: Not on file  . Highest education level: Not on file  Occupational History  . Not on file  Tobacco Use  . Smoking status: Never Smoker  . Smokeless tobacco: Never Used  Vaping Use  . Vaping Use: Never used  Substance and Sexual Activity  . Alcohol use: Yes    Alcohol/week: 0.0 standard drinks    Comment: Once a week.   . Drug use: No  . Sexual activity: Not Currently    Partners: Male    Comment: pt given condoms  Other Topics Concern  . Not on file  Social History Narrative  . Not on file   Social Determinants of Health   Financial Resource Strain: Not on file  Food Insecurity: Not on file  Transportation Needs: Not on file  Physical Activity: Not on file  Stress: Not on file  Social Connections: Not on file    Allergies  Allergen Reactions  . Shrimp [Shellfish Allergy] Nausea And Vomiting     Current Outpatient Medications:  .  bictegravir-emtricitabine-tenofovir AF (BIKTARVY) 50-200-25 MG TABS tablet, Take 1 tablet by mouth daily., Disp: 30 tablet, Rfl: 11 .  Cyanocobalamin (VITAMIN B 12 PO), Take 1,000 mcg by mouth daily., Disp: , Rfl:  .  fluticasone (FLONASE) 50 MCG/ACT nasal spray, Place 2 sprays into both nostrils daily., Disp: 16 g, Rfl: 6 .  hydrochlorothiazide (HYDRODIURIL) 25 MG tablet, TAKE 1 TABLET BY MOUTH DAILY (Patient  not taking: Reported on 07/04/2020), Disp: 30 tablet, Rfl: 3 .  ibuprofen (ADVIL,MOTRIN) 200 MG tablet, Take 400 mg by mouth every 6 (six) hours as needed for headache, mild pain or moderate pain. , Disp: , Rfl:  .  levocetirizine (XYZAL) 5 MG tablet, Take 1 tablet (5 mg total) by mouth every evening., Disp: 30 tablet, Rfl: 6 .  Multiple Vitamin (MULTIVITAMIN WITH MINERALS) TABS tablet, Take 1 tablet by mouth daily., Disp: , Rfl:  .  nystatin (MYCOSTATIN) 100000 UNIT/ML suspension, Take 5 mLs (500,000 Units total) by mouth 4 (four) times daily. (swish and spit or swish and swallow), Disp: 60 mL, Rfl: 1     Observations/Objective:  Nixon was in good spirits and appeared healthy   Assessment and Plan: HIV disease to new Biktarvy and return to clinic in a year  Hypertension then he is seems to have cured this through weight loss follows with [name].  Prediabetes: A1c is now in the diabetic range 0.  Obesity: He is lost 60 pounds through dietary modification I commended him about this once again.  I spent greater than 40 minutes with the patient including greater than 50% of time in face to face counsel of the patient regarding his HIV his prediabetes his weight gain and successful weight loss or dieting prediabetic risks vaccination COVID prevention and coordination of his care  .   Follow Up Instructions:    I discussed the assessment and treatment plan with the patient. The patient was provided an opportunity to ask questions and all were answered. The patient agreed with the plan and demonstrated an understanding of the instructions.   The patient was advised to call back or seek an in-person evaluation if the symptoms worsen or if the condition fails to improve as anticipated.    Acey Lav, MD

## 2020-12-28 ENCOUNTER — Other Ambulatory Visit: Payer: Self-pay | Admitting: Infectious Disease

## 2021-02-20 ENCOUNTER — Telehealth (INDEPENDENT_AMBULATORY_CARE_PROVIDER_SITE_OTHER): Payer: Self-pay | Admitting: Family Medicine

## 2021-02-20 ENCOUNTER — Encounter: Payer: Self-pay | Admitting: Family Medicine

## 2021-02-20 DIAGNOSIS — J3489 Other specified disorders of nose and nasal sinuses: Secondary | ICD-10-CM

## 2021-02-20 DIAGNOSIS — Z09 Encounter for follow-up examination after completed treatment for conditions other than malignant neoplasm: Secondary | ICD-10-CM

## 2021-02-20 DIAGNOSIS — R059 Cough, unspecified: Secondary | ICD-10-CM

## 2021-02-20 DIAGNOSIS — J019 Acute sinusitis, unspecified: Secondary | ICD-10-CM

## 2021-02-20 MED ORDER — AMOXICILLIN-POT CLAVULANATE 875-125 MG PO TABS: 1.0000 | ORAL_TABLET | Freq: Two times a day (BID) | ORAL | 0 refills | Status: AC

## 2021-02-20 NOTE — Progress Notes (Signed)
Virtual Visit via Telephone Note  I connected with Walter [name] on 02/20/21 at  3:20 PM EDT by telephone and verified that I am speaking with the correct person using two identifiers.  Location: Patient: Home Provider: Office   I discussed the limitations, risks, security and privacy concerns of performing an evaluation and management service by telephone and the availability of in person appointments. I also discussed with the patient that there may be a patient responsible charge related to this service. The patient expressed understanding and agreed to proceed.   History of Present Illness:  Patient Active Problem List   Diagnosis Date Noted  . COVID-19 virus infection 12/19/2019  . Weight loss 09/27/2019  . Prediabetes 09/27/2019  . Class 2 severe obesity due to excess calories with serious comorbidity and body mass index (BMI) of 39.0 to 39.9 in adult [facility]) 03/17/2019  . Seasonal allergies 03/17/2019  . Weight gain 12/14/2018  . Hyperglycemia 11/30/2017  . HTN (hypertension) 01/23/2016  . Screen for STD (sexually transmitted disease) 01/23/2016  . HIV disease (HCC) 10/08/2015  . Acute esophagitis 10/08/2015   Current Status: Since his last office visit, he has c/o sinus issues, nasal drainage, post nasal drip, and sinus pressure. He has not used any medications to aid in relieving his symptoms. He denies fevers, chills, fatigue, recent infections, weight loss, and night sweats. He has not had any headaches, visual changes, dizziness, and falls. No chest pain, heart palpitations, cough and shortness of breath reported. Denies GI problems such as nausea, vomiting, diarrhea, and constipation. He has no reports of blood in stools, dysuria and hematuria. No depression or anxiety, and denies suicidal ideations, homicidal ideations, or auditory hallucinations. He is taking all medications as prescribed. He denies pain today.    Observations/Objective:  Telephone Virtual  Visit   Assessment and Plan:  1. Acute sinusitis, recurrence not specified, unspecified location - amoxicillin-clavulanate (AUGMENTIN) 875-125 MG tablet; Take 1 tablet by mouth 2 (two) times daily for 7 days.  Dispense: 14 tablet; Refill: 0  2. Sinus pressure  3. Cough Mild today.   4. Follow up He will keep follow up appointment.   Meds ordered this encounter  Medications  . amoxicillin-clavulanate (AUGMENTIN) 875-125 MG tablet    Sig: Take 1 tablet by mouth 2 (two) times daily for 7 days.    Dispense:  14 tablet    Refill:  0    No orders of the defined types were placed in this encounter.   Referral Orders  No referral(s) requested today     [name], MSN, ANE, FNP-BC [facility] Medicine/[facility] [address] [place], [place] [address] [phone] fax     I discussed the assessment and treatment plan with the patient. The patient was provided an opportunity to ask questions and all were answered. The patient agreed with the plan and demonstrated an understanding of the instructions.   The patient was advised to call back or seek an in-person evaluation if the symptoms worsen or if the condition fails to improve as anticipated.  I provided 20 minutes of non-face-to-face time during this encounter.   Kallie Locks, FNP

## 2021-02-25 ENCOUNTER — Encounter: Payer: Self-pay | Admitting: Family Medicine

## 2021-05-22 ENCOUNTER — Emergency Department (HOSPITAL_COMMUNITY)
Admission: EM | Admit: 2021-05-22 | Discharge: 2021-05-22 | Disposition: A | Discharge status: Home / Self Care | Payer: Self-pay | Attending: Emergency Medicine | Admitting: Emergency Medicine

## 2021-05-22 ENCOUNTER — Other Ambulatory Visit: Payer: Self-pay

## 2021-05-22 ENCOUNTER — Encounter (HOSPITAL_COMMUNITY): Payer: Self-pay

## 2021-05-22 DIAGNOSIS — Z830 Family history of human immunodeficiency virus [HIV] disease: Secondary | ICD-10-CM | POA: Insufficient documentation

## 2021-05-22 DIAGNOSIS — S0181XA Laceration without foreign body of other part of head, initial encounter: Secondary | ICD-10-CM

## 2021-05-22 DIAGNOSIS — Z8616 Personal history of COVID-19: Secondary | ICD-10-CM | POA: Insufficient documentation

## 2021-05-22 DIAGNOSIS — I10 Essential (primary) hypertension: Secondary | ICD-10-CM | POA: Insufficient documentation

## 2021-05-22 DIAGNOSIS — S01112A Laceration without foreign body of left eyelid and periocular area, initial encounter: Secondary | ICD-10-CM | POA: Insufficient documentation

## 2021-05-22 DIAGNOSIS — X58XXXA Exposure to other specified factors, initial encounter: Secondary | ICD-10-CM | POA: Insufficient documentation

## 2021-05-22 MED ORDER — LIDOCAINE-EPINEPHRINE-TETRACAINE (LET) TOPICAL GEL
3.0000 mL | Freq: Once | TOPICAL | Status: AC
  Administered 2021-05-22: 03:00:00 3 mL via TOPICAL
  Filled 2021-05-22: qty 3

## 2021-05-22 MED ORDER — LIDOCAINE-EPINEPHRINE (PF) 2 %-1:200000 IJ SOLN
10.0000 mL | Freq: Once | INTRAMUSCULAR | Status: AC
  Administered 2021-05-22: 10 mL
  Filled 2021-05-22: qty 20

## 2021-05-22 NOTE — Discharge Instructions (Addendum)
1. Medications: Tylenol or ibuprofen for pain, usual home medications 2. Treatment: ice for swelling, keep wound clean with warm soap and water and keep bandage dry, do not submerge in water for 24 hours 3. Follow Up: Your sutures are dissolvable.  They should come out in 5-7 days. Return to the emergency department for increased redness, drainage of pus from the wound   WOUND CARE  Keep area clean and dry for 24 hours. Do not remove bandage, if applied.  After 24 hours, remove bandage and wash wound gently with mild soap and warm water. Reapply a new bandage after cleaning wound, if directed.   Continue daily cleansing with soap and water until stitches/staples are removed.  Do not apply any ointments or creams to the wound while stitches/staples are in place, as this may cause delayed healing. Return if you experience any of the following signs of infection: Swelling, redness, pus drainage, streaking, fever >101.0 F  Return if you experience excessive bleeding that does not stop after 15-20 minutes of constant, firm pressure.

## 2021-05-22 NOTE — ED Provider Notes (Signed)
[facility]-EMERGENCY DEPT Provider Note   CSN: [id number] Arrival date & time: 05/22/21  0019     History Chief Complaint  Patient presents with   Facial Laceration    Walter [name] is a 45 y.o. male presents to the Emergency Department complaining of acute, persistent laceration to the left eyebrow onset around midnight.  Patient reports he was trying to scare his nieces when he collided with one of them.  He is not sure what he hit but does have a laceration.  Denies loss of consciousness, headache, neck pain, vision changes.  Patient up-to-date on his tetanus shot.  Is HIV positive but is compliant with medications.  No other history of immunocompromise.  CD4 310 in January 2022.  At that time virus was undetectable.  The history is provided by the patient and medical records. No language interpreter was used.      Past Medical History:  Diagnosis Date   Acute esophagitis 10/08/2015   AIDS (HCC) 10/08/2015   COVID-19 virus infection 12/19/2019   Essential hypertension    Hyperglycemia 11/30/2017   Prediabetes 02/2019   Screen for STD (sexually transmitted disease) 01/23/2016   Seasonal allergies    Weight gain 12/14/2018    Patient Active Problem List   Diagnosis Date Noted   COVID-19 virus infection 12/19/2019   Weight loss 09/27/2019   Prediabetes 09/27/2019   Class 2 severe obesity due to excess calories with serious comorbidity and body mass index (BMI) of 39.0 to 39.9 in adult [facility]) 03/17/2019   Seasonal allergies 03/17/2019   Weight gain 12/14/2018   Hyperglycemia 11/30/2017   HTN (hypertension) 01/23/2016   Screen for STD (sexually transmitted disease) 01/23/2016   HIV disease (HCC) 10/08/2015   Acute esophagitis 10/08/2015    History reviewed. No pertinent surgical history.     Family History  Problem Relation Age of Onset   Hypertension Mother    Diabetes Father     Social History   Tobacco Use   Smoking status: Never    Smokeless tobacco: Never  Vaping Use   Vaping Use: Never used  Substance Use Topics   Alcohol use: Yes    Alcohol/week: 0.0 standard drinks    Comment: Once a week.    Drug use: No    Home Medications Prior to Admission medications   Medication Sig Start Date End Date Taking? Authorizing Provider  BIKTARVY 50-200-25 MG TABS tablet TAKE 1 TABLET BY MOUTH DAILY 12/28/20   Daiva Eves, Lisette Grinder, MD  Cyanocobalamin (VITAMIN B 12 PO) Take 1,000 mcg by mouth daily.    [provider]  fluticasone (FLONASE) 50 MCG/ACT nasal spray Place 2 sprays into both nostrils daily. 03/16/19   Kallie Locks, FNP  ibuprofen (ADVIL,MOTRIN) 200 MG tablet Take 400 mg by mouth every 6 (six) hours as needed for headache, mild pain or moderate pain.     [provider]  levocetirizine (XYZAL) 5 MG tablet Take 1 tablet (5 mg total) by mouth every evening. 03/16/19   Kallie Locks, FNP  Multiple Vitamin (MULTIVITAMIN WITH MINERALS) TABS tablet Take 1 tablet by mouth daily.    [provider]  nystatin (MYCOSTATIN) 100000 UNIT/ML suspension Take 5 mLs (500,000 Units total) by mouth 4 (four) times daily. (swish and spit or swish and swallow) 12/01/20   Kallie Locks, FNP  diphenhydrAMINE (BENADRYL) 25 MG tablet Take 1 tablet (25 mg total) by mouth every 6 (six) hours. Take for 3 days then  as needed for recurrent rash Patient not taking: Reported on 10/04/2015 05/03/15 10/04/15  Elpidio Anis, PA-C    Allergies    Shrimp [shellfish allergy]  Review of Systems   Review of Systems  Constitutional:  Negative for appetite change, diaphoresis, fatigue, fever and unexpected weight change.  HENT:  Negative for mouth sores.   Eyes:  Negative for visual disturbance.  Respiratory:  Negative for cough, chest tightness, shortness of breath and wheezing.   Cardiovascular:  Negative for chest pain.  Gastrointestinal:  Negative for abdominal pain, constipation, diarrhea, nausea and vomiting.   Endocrine: Negative for polydipsia, polyphagia and polyuria.  Genitourinary:  Negative for dysuria, frequency, hematuria and urgency.  Musculoskeletal:  Negative for back pain and neck stiffness.  Skin:  Positive for wound. Negative for rash.  Allergic/Immunologic: Negative for immunocompromised state.  Neurological:  Negative for syncope, light-headedness and headaches.  Hematological:  Does not bruise/bleed easily.  Psychiatric/Behavioral:  Negative for sleep disturbance. The patient is not nervous/anxious.    Physical Exam Updated Vital Signs BP 134/90   Pulse 64   Temp 98.1 F (36.7 C) (Oral)   Resp 18   Ht 5' 9.5" (1.765 m)   Wt 105.2 kg   SpO2 99%   BMI 33.77 kg/m   Physical Exam Vitals and nursing note reviewed.  Constitutional:      General: He is not in acute distress.    Appearance: He is well-developed. He is not ill-appearing.  HENT:     Head: Normocephalic. Laceration present.      Nose: Nose normal.  Eyes:     General: No scleral icterus.    Extraocular Movements: Extraocular movements intact.     Conjunctiva/sclera: Conjunctivae normal.     Pupils: Pupils are equal, round, and reactive to light.  Cardiovascular:     Rate and Rhythm: Normal rate.  Pulmonary:     Effort: Pulmonary effort is normal.  Abdominal:     General: There is no distension.  Musculoskeletal:        General: Normal range of motion.     Cervical back: Normal range of motion.  Skin:    General: Skin is warm and dry.  Neurological:     Mental Status: He is alert.  Psychiatric:        Mood and Affect: Mood normal.    ED Results / Procedures / Treatments    Procedures .Marland KitchenLaceration Repair  Date/Time: 05/22/2021 4:01 AM Performed by: Dierdre Forth, PA-C Authorized by: Dierdre Forth, PA-C   Consent:    Consent obtained:  Verbal   Consent given by:  Patient   Risks discussed:  Infection, need for additional repair, pain, poor cosmetic result and poor wound  healing   Alternatives discussed:  No treatment and delayed treatment Universal protocol:    Procedure explained and questions answered to patient or proxy's satisfaction: yes     Relevant documents present and verified: yes     Test results available: yes     Imaging studies available: yes     Required blood products, implants, devices, and special equipment available: yes     Site/side marked: yes     Immediately prior to procedure, a time out was called: yes     Patient identity confirmed:  Verbally with patient Anesthesia:    Anesthesia method:  Local infiltration and topical application   Topical anesthetic:  LET   Local anesthetic:  Lidocaine 2% WITH epi Laceration details:    Location:  Face  Face location:  L eyebrow   Length (cm):  3 Pre-procedure details:    Preparation:  Patient was prepped and draped in usual sterile fashion Exploration:    Hemostasis achieved with:  Epinephrine, direct pressure and LET   Wound exploration: entire depth of wound visualized   Treatment:    Area cleansed with:  Saline   Amount of cleaning:  Standard   Irrigation solution:  Sterile water   Irrigation method:  Syringe Skin repair:    Repair method:  Sutures   Suture size:  5-0   Suture material:  Fast-absorbing gut   Suture technique:  Simple interrupted   Number of sutures:  4 Approximation:    Approximation:  Close Repair type:    Repair type:  Simple Post-procedure details:    Dressing:  Open (no dressing)   Procedure completion:  Tolerated well, no immediate complications   Medications Ordered in ED Medications  lidocaine-EPINEPHrine-tetracaine (LET) topical gel (3 mLs Topical Given 05/22/21 0237)  lidocaine-EPINEPHrine (XYLOCAINE W/EPI) 2 %-1:200000 (PF) injection 10 mL (10 mLs Infiltration Given 05/22/21 0237)    ED Course  I have reviewed the triage vital signs and the nursing notes.  Pertinent labs & imaging results that were available during my care of the patient  were reviewed by me and considered in my medical decision making (see chart for details).    MDM Rules/Calculators/A&P                          Patient with 3 cm laceration in the left eyebrow.  Neurologically intact.  Will need sutures.  LET ordered and applied.  4:02 AM Pressure irrigation performed. Wound explored and base of wound visualized in a bloodless field without evidence of foreign body.  Laceration occurred < 8 hours prior to repair which was well tolerated. Tdap up to date.  Pt discharged without antibiotics.  Discussed suture home care with patient and answered questions. Pt to follow-up for wound check and suture removal in 7 days; they are to return to the ED sooner for signs of infection. Pt is hemodynamically stable with no complaints prior to dc.     Final Clinical Impression(s) / ED Diagnoses Final diagnoses:  Facial laceration, initial encounter    Rx / DC Orders ED Discharge Orders     None        Sharae Zappulla, Boyd Kerbs 05/22/21 0403    Sabas Sous, MD 05/22/21 [phone]

## 2021-05-22 NOTE — ED Notes (Signed)
Patient is discharged AVS reviewed no concerns at this time.

## 2021-05-22 NOTE — ED Triage Notes (Signed)
Patient arrived with laceration to left eyebrow

## 2021-05-29 ENCOUNTER — Other Ambulatory Visit: Payer: Self-pay

## 2021-05-29 ENCOUNTER — Ambulatory Visit: Payer: Self-pay

## 2021-05-29 DIAGNOSIS — B20 Human immunodeficiency virus [HIV] disease: Secondary | ICD-10-CM

## 2021-05-30 ENCOUNTER — Other Ambulatory Visit: Payer: Self-pay

## 2021-05-30 DIAGNOSIS — B20 Human immunodeficiency virus [HIV] disease: Secondary | ICD-10-CM

## 2021-05-30 LAB — T-HELPER CELL (CD4) - (RCID CLINIC ONLY)
CD4 % Helper T Cell: 25 % — ABNORMAL LOW (ref 33–65)
CD4 T Cell Abs: 385 /uL — ABNORMAL LOW (ref 400–1790)

## 2021-05-30 MED ORDER — BIKTARVY 50-200-25 MG PO TABS: 1.0000 | ORAL_TABLET | Freq: Every day | ORAL | 5 refills | Status: DC

## 2021-05-31 LAB — CBC WITH DIFFERENTIAL/PLATELET
Absolute Monocytes: 331 cells/uL (ref 200–950)
Basophils Absolute: 61 cells/uL (ref 0–200)
Basophils Relative: 1.7 %
Eosinophils Absolute: 61 cells/uL (ref 15–500)
Eosinophils Relative: 1.7 %
HCT: 47.2 % (ref 38.5–50.0)
Hemoglobin: 15.8 g/dL (ref 13.2–17.1)
Lymphs Abs: 1660 cells/uL (ref 850–3900)
MCH: 32.4 pg (ref 27.0–33.0)
MCHC: 33.5 g/dL (ref 32.0–36.0)
MCV: 96.7 fL (ref 80.0–100.0)
MPV: 9.4 fL (ref 7.5–12.5)
Monocytes Relative: 9.2 %
Neutro Abs: 1487 cells/uL — ABNORMAL LOW (ref 1500–7800)
Neutrophils Relative %: 41.3 %
Platelets: 255 10*3/uL (ref 140–400)
RBC: 4.88 10*6/uL (ref 4.20–5.80)
RDW: 12.9 % (ref 11.0–15.0)
Total Lymphocyte: 46.1 %
WBC: 3.6 10*3/uL — ABNORMAL LOW (ref 3.8–10.8)

## 2021-05-31 LAB — COMPLETE METABOLIC PANEL WITH GFR
AG Ratio: 1.5 (calc) (ref 1.0–2.5)
ALT: 24 U/L (ref 9–46)
AST: 28 U/L (ref 10–40)
Albumin: 4.4 g/dL (ref 3.6–5.1)
Alkaline phosphatase (APISO): 74 U/L (ref 36–130)
BUN: 13 mg/dL (ref 7–25)
CO2: 29 mmol/L (ref 20–32)
Calcium: 9.5 mg/dL (ref 8.6–10.3)
Chloride: 104 mmol/L (ref 98–110)
Creat: 1.2 mg/dL (ref 0.60–1.35)
GFR, Est African American: 85 mL/min/{1.73_m2} (ref >=60)
GFR, Est Non African American: 73 mL/min/{1.73_m2} (ref >=60)
Globulin: 2.9 g/dL (calc) (ref 1.9–3.7)
Glucose, Bld: 103 mg/dL — ABNORMAL HIGH (ref 65–99)
Potassium: 4.5 mmol/L (ref 3.5–5.3)
Sodium: 139 mmol/L (ref 135–146)
Total Bilirubin: 0.4 mg/dL (ref 0.2–1.2)
Total Protein: 7.3 g/dL (ref 6.1–8.1)

## 2021-05-31 LAB — LIPID PANEL
Cholesterol: 179 mg/dL (ref <200)
HDL: 52 mg/dL (ref >=40)
LDL Cholesterol (Calc): 110 mg/dL (calc) — ABNORMAL HIGH
Non-HDL Cholesterol (Calc): 127 mg/dL (calc) (ref <130)
Total CHOL/HDL Ratio: 3.4 (calc) (ref <5.0)
Triglycerides: 83 mg/dL (ref <150)

## 2021-05-31 LAB — HIV-1 RNA QUANT-NO REFLEX-BLD
HIV 1 RNA Quant: <20 Copies/mL — ABNORMAL HIGH
HIV-1 RNA Quant, Log: <1.3 Log cps/mL — ABNORMAL HIGH

## 2021-05-31 LAB — RPR: RPR Ser Ql: NONREACTIVE

## 2021-06-26 ENCOUNTER — Telehealth (INDEPENDENT_AMBULATORY_CARE_PROVIDER_SITE_OTHER): Payer: Self-pay | Admitting: Infectious Disease

## 2021-06-26 ENCOUNTER — Encounter: Payer: Self-pay | Admitting: Infectious Disease

## 2021-06-26 ENCOUNTER — Other Ambulatory Visit: Payer: Self-pay

## 2021-06-26 DIAGNOSIS — B20 Human immunodeficiency virus [HIV] disease: Secondary | ICD-10-CM

## 2021-06-26 DIAGNOSIS — R7303 Prediabetes: Secondary | ICD-10-CM

## 2021-06-26 DIAGNOSIS — Z6839 Body mass index (BMI) 39.0-39.9, adult: Secondary | ICD-10-CM

## 2021-06-26 DIAGNOSIS — I10 Essential (primary) hypertension: Secondary | ICD-10-CM

## 2021-06-26 NOTE — Progress Notes (Signed)
Virtual Visit via Video Note  I connected with Walter [name] on 06/26/21 at  9:30 AM EDT by a video enabled telemedicine application and verified that I am speaking with the correct person using two identifiers.  Location: Patient: Home Provider: [facility]   I discussed the limitations of evaluation and management by telemedicine and the availability of in person appointments. The patient expressed understanding and agreed to proceed.  History of Present Illness:  Walter [name] is a 45 year old black man living with HIV that is been perfectly controlled on Biktarvy.  He had COVID-19 infection in 2021 he is now fully vaccinated and has had in fact 2 boosters  He has lost 50 pounds through dietary modifications.  He continues to follow with Walter [name] for primary care.  We reviewed all of his laboratory data together and scheduled his appointment for follow-up with labs in January 5 along with a meeting with financial counseling and then meeting with me later in January 2023.  I have counseled him to come in and get COVID-19 vaccine that is in the updated formulation in September.     Past Medical History:  Diagnosis Date   Acute esophagitis 10/08/2015   AIDS (HCC) 10/08/2015   COVID-19 virus infection 12/19/2019   Essential hypertension    Hyperglycemia 11/30/2017   Prediabetes 02/2019   Screen for STD (sexually transmitted disease) 01/23/2016   Seasonal allergies    Weight gain 12/14/2018    No past surgical history on file.  Family History  Problem Relation Age of Onset   Hypertension Mother    Diabetes Father       Social History   Socioeconomic History   Marital status: Single    Spouse name: Not on file   Number of children: Not on file   Years of education: Not on file   Highest education level: Not on file  Occupational History   Not on file  Tobacco Use   Smoking status: Never   Smokeless tobacco: Never  Vaping Use   Vaping Use: Never used  Substance and  Sexual Activity   Alcohol use: Yes    Comment: occ   Drug use: No   Sexual activity: Not Currently    Partners: Male    Comment: declined condoms  Other Topics Concern   Not on file  Social History Narrative   Not on file   Social Determinants of Health   Financial Resource Strain: Not on file  Food Insecurity: Not on file  Transportation Needs: Not on file  Physical Activity: Not on file  Stress: Not on file  Social Connections: Not on file    Allergies  Allergen Reactions   Shrimp [Shellfish Allergy] Nausea And Vomiting     Current Outpatient Medications:    bictegravir-emtricitabine-tenofovir AF (BIKTARVY) 50-200-25 MG TABS tablet, Take 1 tablet by mouth daily., Disp: 30 tablet, Rfl: 5   Cyanocobalamin (VITAMIN B 12 PO), Take 1,000 mcg by mouth daily., Disp: , Rfl:    fluticasone (FLONASE) 50 MCG/ACT nasal spray, Place 2 sprays into both nostrils daily., Disp: 16 g, Rfl: 6   ibuprofen (ADVIL,MOTRIN) 200 MG tablet, Take 400 mg by mouth every 6 (six) hours as needed for headache, mild pain or moderate pain. , Disp: , Rfl:    levocetirizine (XYZAL) 5 MG tablet, Take 1 tablet (5 mg total) by mouth every evening., Disp: 30 tablet, Rfl: 6   Multiple Vitamin (MULTIVITAMIN WITH MINERALS) TABS tablet, Take 1 tablet by mouth daily., Disp: ,  Rfl:    nystatin (MYCOSTATIN) 100000 UNIT/ML suspension, Take 5 mLs (500,000 Units total) by mouth 4 (four) times daily. (swish and spit or swish and swallow), Disp: 60 mL, Rfl: 1  Review of systems as above otherwise 12 point review systems is negative  Observations/Objective: Walter [name] appeared to be healthy and in good spirits.   Assessment and Plan: HIV disease: I reviewed his most recent viral load from July 6 which was less than 20 along with his CD4 count which was 385.  Also reviewed other pertinent labs including RPR which was negative his CMP and a CBC with differential  I sent in prescription for BIKTARVY with a years worth of  refills.  Prediabetes: Walter [name] is at times nearly become diabetic but I reviewed his most recent A1c's including for November 27, 2020 which was 5.4 in Mar 26, 2020.  I have also reviewed his notes from his primary care physician Walter [name] blood glucose on our most recent lab on May 29, 2021 was 103.  Hypertension: In the past he had been hypertensive but fortunately through weight loss his blood pressures come into the normal range.  I reviewed pertinent vital signs from care office visit including one from May 22, 2021 and his blood pressure was 126/94 pulse of 57.  Obesity: He did lose nearly 50 pounds of weight he tells me having been 270 pounds at 1 point in time.  He has cut out red meats and pork but still eating too many carbohydrates he had a greater weight gain when he had cut carbohydrates out more aggressively and I have counseled him to return to doing so.    Follow Up Instructions:    I discussed the assessment and treatment plan with the patient. The patient was provided an opportunity to ask questions and all were answered. The patient agreed with the plan and demonstrated an understanding of the instructions.   The patient was advised to call back or seek an in-person evaluation if the symptoms worsen or if the condition fails to improve as anticipated.     Acey Lav, MD

## 2021-08-19 ENCOUNTER — Ambulatory Visit: Payer: Self-pay

## 2021-08-19 ENCOUNTER — Ambulatory Visit (INDEPENDENT_AMBULATORY_CARE_PROVIDER_SITE_OTHER): Payer: Self-pay

## 2021-08-19 ENCOUNTER — Other Ambulatory Visit: Payer: Self-pay

## 2021-08-19 DIAGNOSIS — Z23 Encounter for immunization: Secondary | ICD-10-CM

## 2021-08-19 NOTE — Progress Notes (Signed)
   Covid-19 Vaccination Clinic  Name:  RAYFIELD BEEM    MRN: [id number] DOB: 1976-05-27  08/19/2021  Mr. Gutridge was observed post Covid-19 immunization for 15 minutes without incident. He was provided with Vaccine Information Sheet and instruction to access the V-Safe system.   Mr. Dennie was instructed to call 911 with any severe reactions post vaccine: Difficulty breathing  Swelling of face and throat  A fast heartbeat  A bad rash all over body  Dizziness and weakness     Sandie Ano, RN

## 2021-09-25 ENCOUNTER — Encounter: Payer: Self-pay | Admitting: Nurse Practitioner

## 2021-09-25 ENCOUNTER — Encounter: Payer: Self-pay | Admitting: Family Medicine

## 2021-10-07 ENCOUNTER — Encounter: Payer: Self-pay | Admitting: Nurse Practitioner

## 2021-10-30 ENCOUNTER — Other Ambulatory Visit: Payer: Self-pay

## 2021-10-30 DIAGNOSIS — B20 Human immunodeficiency virus [HIV] disease: Secondary | ICD-10-CM

## 2021-10-30 MED ORDER — BIKTARVY 50-200-25 MG PO TABS: 1.0000 | ORAL_TABLET | Freq: Every day | ORAL | 1 refills | Status: DC

## 2021-11-28 ENCOUNTER — Ambulatory Visit: Payer: Self-pay

## 2021-11-28 ENCOUNTER — Other Ambulatory Visit: Payer: Self-pay

## 2021-11-28 DIAGNOSIS — B20 Human immunodeficiency virus [HIV] disease: Secondary | ICD-10-CM

## 2021-11-29 LAB — T-HELPER CELL (CD4) - (RCID CLINIC ONLY)
CD4 % Helper T Cell: 23 % — ABNORMAL LOW (ref 33–65)
CD4 T Cell Abs: 389 /uL — ABNORMAL LOW (ref 400–1790)

## 2021-12-03 LAB — COMPLETE METABOLIC PANEL WITH GFR
AG Ratio: 1.6 (calc) (ref 1.0–2.5)
ALT: 22 U/L (ref 9–46)
AST: 21 U/L (ref 10–40)
Albumin: 4.3 g/dL (ref 3.6–5.1)
Alkaline phosphatase (APISO): 80 U/L (ref 36–130)
BUN: 10 mg/dL (ref 7–25)
CO2: 28 mmol/L (ref 20–32)
Calcium: 9.1 mg/dL (ref 8.6–10.3)
Chloride: 108 mmol/L (ref 98–110)
Creat: 1.05 mg/dL (ref 0.60–1.29)
Globulin: 2.7 g/dL (calc) (ref 1.9–3.7)
Glucose, Bld: 114 mg/dL — ABNORMAL HIGH (ref 65–99)
Potassium: 4.1 mmol/L (ref 3.5–5.3)
Sodium: 140 mmol/L (ref 135–146)
Total Bilirubin: 0.4 mg/dL (ref 0.2–1.2)
Total Protein: 7 g/dL (ref 6.1–8.1)
eGFR: 90 mL/min/{1.73_m2} (ref >=60)

## 2021-12-03 LAB — LIPID PANEL
Cholesterol: 175 mg/dL (ref <200)
HDL: 53 mg/dL (ref >=40)
LDL Cholesterol (Calc): 108 mg/dL (calc) — ABNORMAL HIGH
Non-HDL Cholesterol (Calc): 122 mg/dL (calc) (ref <130)
Total CHOL/HDL Ratio: 3.3 (calc) (ref <5.0)
Triglycerides: 58 mg/dL (ref <150)

## 2021-12-03 LAB — CBC WITH DIFFERENTIAL/PLATELET
Absolute Monocytes: 285 cells/uL (ref 200–950)
Basophils Absolute: 59 cells/uL (ref 0–200)
Basophils Relative: 1.6 %
Eosinophils Absolute: 52 cells/uL (ref 15–500)
Eosinophils Relative: 1.4 %
HCT: 46.7 % (ref 38.5–50.0)
Hemoglobin: 15.9 g/dL (ref 13.2–17.1)
Lymphs Abs: 1854 cells/uL (ref 850–3900)
MCH: 32.5 pg (ref 27.0–33.0)
MCHC: 34 g/dL (ref 32.0–36.0)
MCV: 95.5 fL (ref 80.0–100.0)
MPV: 9.1 fL (ref 7.5–12.5)
Monocytes Relative: 7.7 %
Neutro Abs: 1450 cells/uL — ABNORMAL LOW (ref 1500–7800)
Neutrophils Relative %: 39.2 %
Platelets: 277 10*3/uL (ref 140–400)
RBC: 4.89 10*6/uL (ref 4.20–5.80)
RDW: 12.9 % (ref 11.0–15.0)
Total Lymphocyte: 50.1 %
WBC: 3.7 10*3/uL — ABNORMAL LOW (ref 3.8–10.8)

## 2021-12-03 LAB — HIV-1 RNA ULTRAQUANT REFLEX TO GENTYP+
HIV 1 RNA Quant: NOT DETECTED copies/mL
HIV-1 RNA Quant, Log: NOT DETECTED Log copies/mL

## 2021-12-03 LAB — RPR: RPR Ser Ql: NONREACTIVE

## 2021-12-11 ENCOUNTER — Encounter: Payer: Self-pay | Admitting: Infectious Disease

## 2021-12-13 ENCOUNTER — Telehealth: Payer: Self-pay | Admitting: Infectious Disease

## 2021-12-17 ENCOUNTER — Telehealth: Payer: Self-pay | Admitting: Infectious Disease

## 2021-12-17 ENCOUNTER — Other Ambulatory Visit: Payer: Self-pay

## 2021-12-18 ENCOUNTER — Other Ambulatory Visit: Payer: Self-pay

## 2021-12-18 DIAGNOSIS — B20 Human immunodeficiency virus [HIV] disease: Secondary | ICD-10-CM

## 2021-12-18 MED ORDER — BIKTARVY 50-200-25 MG PO TABS: 1.0000 | ORAL_TABLET | Freq: Every day | ORAL | 2 refills | Status: DC

## 2021-12-23 ENCOUNTER — Telehealth (INDEPENDENT_AMBULATORY_CARE_PROVIDER_SITE_OTHER): Payer: Self-pay | Admitting: Infectious Disease

## 2021-12-23 ENCOUNTER — Other Ambulatory Visit: Payer: Self-pay

## 2021-12-23 DIAGNOSIS — R739 Hyperglycemia, unspecified: Secondary | ICD-10-CM

## 2021-12-23 DIAGNOSIS — E669 Obesity, unspecified: Secondary | ICD-10-CM

## 2021-12-23 DIAGNOSIS — Z6839 Body mass index (BMI) 39.0-39.9, adult: Secondary | ICD-10-CM

## 2021-12-23 DIAGNOSIS — I10 Essential (primary) hypertension: Secondary | ICD-10-CM

## 2021-12-23 DIAGNOSIS — R7303 Prediabetes: Secondary | ICD-10-CM

## 2021-12-23 DIAGNOSIS — B20 Human immunodeficiency virus [HIV] disease: Secondary | ICD-10-CM

## 2021-12-23 MED ORDER — BIKTARVY 50-200-25 MG PO TABS: 1.0000 | ORAL_TABLET | Freq: Every day | ORAL | 11 refills | Status: DC

## 2021-12-23 NOTE — Progress Notes (Signed)
Virtual Visit via Video Note  I connected with Walter [name] on 12/23/21 at  9:45 AM EST by a video enabled telemedicine application and verified that I am speaking with the correct person using two identifiers.  Location: Patient: Home Provider: [facility]   I discussed the limitations of evaluation and management by telemedicine and the availability of in person appointments. The patient expressed understanding and agreed to proceed.  History of Present Illness:  Walter [name] is a 46 year-old black man living with HIV that is been perfectly controlled on Biktarvy.   He has lost 50 pounds through dietary modifications.  He continues to follow with [name] for primary care.  Walter [name] back to me via video feed for his MyChart video visit.  We reviewed all of his laboratory data and his viral load remains suppressed CD4 count is in the 300s.  Is continue to keep weight off and has actually now gone down into the 220s.      Past Medical History:  Diagnosis Date   Acute esophagitis 10/08/2015   AIDS (HCC) 10/08/2015   COVID-19 virus infection 12/19/2019   Essential hypertension    Hyperglycemia 11/30/2017   Prediabetes 02/2019   Screen for STD (sexually transmitted disease) 01/23/2016   Seasonal allergies    Weight gain 12/14/2018    No past surgical history on file.  Family History  Problem Relation Age of Onset   Hypertension Mother    Diabetes Father       Social History   Socioeconomic History   Marital status: Single    Spouse name: Not on file   Number of children: Not on file   Years of education: Not on file   Highest education level: Not on file  Occupational History   Not on file  Tobacco Use   Smoking status: Never   Smokeless tobacco: Never  Vaping Use   Vaping Use: Never used  Substance and Sexual Activity   Alcohol use: Yes    Comment: occ   Drug use: No   Sexual activity: Not Currently    Partners: Male    Comment: declined condoms  Other  Topics Concern   Not on file  Social History Narrative   Not on file   Social Determinants of Health   Financial Resource Strain: Not on file  Food Insecurity: Not on file  Transportation Needs: Not on file  Physical Activity: Not on file  Stress: Not on file  Social Connections: Not on file    Allergies  Allergen Reactions   Shrimp [Shellfish Allergy] Nausea And Vomiting     Current Outpatient Medications:    bictegravir-emtricitabine-tenofovir AF (BIKTARVY) 50-200-25 MG TABS tablet, Take 1 tablet by mouth daily., Disp: 30 tablet, Rfl: 2   Cyanocobalamin (VITAMIN B 12 PO), Take 1,000 mcg by mouth daily., Disp: , Rfl:    fluticasone (FLONASE) 50 MCG/ACT nasal spray, Place 2 sprays into both nostrils daily., Disp: 16 g, Rfl: 6   ibuprofen (ADVIL,MOTRIN) 200 MG tablet, Take 400 mg by mouth every 6 (six) hours as needed for headache, mild pain or moderate pain. , Disp: , Rfl:    levocetirizine (XYZAL) 5 MG tablet, Take 1 tablet (5 mg total) by mouth every evening., Disp: 30 tablet, Rfl: 6   Multiple Vitamin (MULTIVITAMIN WITH MINERALS) TABS tablet, Take 1 tablet by mouth daily., Disp: , Rfl:    nystatin (MYCOSTATIN) 100000 UNIT/ML suspension, Take 5 mLs (500,000 Units total) by mouth 4 (four) times daily. (swish and  spit or swish and swallow), Disp: 60 mL, Rfl: 1  Review of systems as above otherwise 12 point review systems is negative  Observations/Objective: Rilan appears to be doing well was healthy appearing and in no acute distress    HIV I have reviewed his most recent viral load from November 28, 2021 and it was not detected  Lab Results  Component Value Date   HIV1RNAQUANT NOT DETECTED 11/28/2021   His most recent CD4 count from same date and it was 389 Lab Results  Component Value Date   CD4TABS 389 (L) 11/28/2021   CD4TABS 385 (L) 05/29/2021   CD4TABS 310 (L) 11/27/2020    I have sent in prescription for BIKTARVY she will continue hypertension: He had been  on antihypertensive medications in the past but with his weight loss came off of them per primary care.  He does need to establish with primary care again.   Hypertension: In the past he had been hypertensive but fortunately through weight loss his blood pressures come into the normal range.  I reviewed pertinent vital signs from care office visit including one from May 22, 2021 and his blood pressure was 126/94 pulse of 57.  Prediabetes: Again the weight loss is avoided progressing  Obesity: He is doing tremendous job losing a great deal of weight and maintaining himself at current weight  Follow Up Instructions:    I discussed the assessment and treatment plan with the patient. The patient was provided an opportunity to ask questions and all were answered. The patient agreed with the plan and demonstrated an understanding of the instructions.   The patient was advised to call back or seek an in-person evaluation if the symptoms worsen or if the condition fails to improve as anticipated.     Acey Lav, MD

## 2022-04-07 ENCOUNTER — Ambulatory Visit: Payer: Self-pay | Admitting: Family Medicine

## 2022-06-02 ENCOUNTER — Other Ambulatory Visit: Payer: Self-pay

## 2022-06-02 ENCOUNTER — Ambulatory Visit: Payer: Self-pay

## 2022-06-02 DIAGNOSIS — B20 Human immunodeficiency virus [HIV] disease: Secondary | ICD-10-CM

## 2022-06-03 LAB — T-HELPER CELL (CD4) - (RCID CLINIC ONLY)
CD4 % Helper T Cell: 27 % — ABNORMAL LOW (ref 33–65)
CD4 T Cell Abs: 436 /uL (ref 400–1790)

## 2022-06-04 LAB — CBC WITH DIFFERENTIAL/PLATELET
Absolute Monocytes: 360 cells/uL (ref 200–950)
Basophils Absolute: 61 cells/uL (ref 0–200)
Basophils Relative: 1.7 %
Eosinophils Absolute: 68 cells/uL (ref 15–500)
Eosinophils Relative: 1.9 %
HCT: 45.1 % (ref 38.5–50.0)
Hemoglobin: 15.2 g/dL (ref 13.2–17.1)
Lymphs Abs: 1836 cells/uL (ref 850–3900)
MCH: 32.5 pg (ref 27.0–33.0)
MCHC: 33.7 g/dL (ref 32.0–36.0)
MCV: 96.6 fL (ref 80.0–100.0)
MPV: 9.5 fL (ref 7.5–12.5)
Monocytes Relative: 10 %
Neutro Abs: 1274 cells/uL — ABNORMAL LOW (ref 1500–7800)
Neutrophils Relative %: 35.4 %
Platelets: 240 10*3/uL (ref 140–400)
RBC: 4.67 10*6/uL (ref 4.20–5.80)
RDW: 12.7 % (ref 11.0–15.0)
Total Lymphocyte: 51 %
WBC: 3.6 10*3/uL — ABNORMAL LOW (ref 3.8–10.8)

## 2022-06-04 LAB — COMPLETE METABOLIC PANEL WITH GFR
AG Ratio: 1.6 (calc) (ref 1.0–2.5)
ALT: 20 U/L (ref 9–46)
AST: 25 U/L (ref 10–40)
Albumin: 4.2 g/dL (ref 3.6–5.1)
Alkaline phosphatase (APISO): 80 U/L (ref 36–130)
BUN: 11 mg/dL (ref 7–25)
CO2: 26 mmol/L (ref 20–32)
Calcium: 9 mg/dL (ref 8.6–10.3)
Chloride: 108 mmol/L (ref 98–110)
Creat: 1.15 mg/dL (ref 0.60–1.29)
Globulin: 2.7 g/dL (calc) (ref 1.9–3.7)
Glucose, Bld: 105 mg/dL — ABNORMAL HIGH (ref 65–99)
Potassium: 4.3 mmol/L (ref 3.5–5.3)
Sodium: 140 mmol/L (ref 135–146)
Total Bilirubin: 0.3 mg/dL (ref 0.2–1.2)
Total Protein: 6.9 g/dL (ref 6.1–8.1)
eGFR: 80 mL/min/{1.73_m2} (ref >=60)

## 2022-06-04 LAB — HIV-1 RNA QUANT-NO REFLEX-BLD
HIV 1 RNA Quant: <20 Copies/mL — ABNORMAL HIGH
HIV-1 RNA Quant, Log: <1.3 Log cps/mL — ABNORMAL HIGH

## 2022-06-04 LAB — RPR: RPR Ser Ql: NONREACTIVE

## 2022-06-17 ENCOUNTER — Telehealth: Payer: Self-pay | Admitting: Infectious Disease

## 2022-07-10 ENCOUNTER — Other Ambulatory Visit: Payer: Self-pay

## 2022-07-10 ENCOUNTER — Telehealth (INDEPENDENT_AMBULATORY_CARE_PROVIDER_SITE_OTHER): Payer: Self-pay | Admitting: Infectious Disease

## 2022-07-10 DIAGNOSIS — B20 Human immunodeficiency virus [HIV] disease: Secondary | ICD-10-CM

## 2022-07-10 DIAGNOSIS — I10 Essential (primary) hypertension: Secondary | ICD-10-CM

## 2022-07-10 DIAGNOSIS — R635 Abnormal weight gain: Secondary | ICD-10-CM

## 2022-07-10 MED ORDER — BIKTARVY 50-200-25 MG PO TABS: 1.0000 | ORAL_TABLET | Freq: Every day | ORAL | 11 refills | Status: DC

## 2022-07-10 MED ORDER — PITAVASTATIN MAGNESIUM 4 MG PO TABS: 4.0000 mg | ORAL_TABLET | Freq: Every day | ORAL | 11 refills | Status: DC

## 2022-07-10 NOTE — Progress Notes (Signed)
Virtual Visit via Video Note  I connected with Walter [name] on 07/10/22 at 10:30 AM EDT by a video enabled telemedicine application and verified that I am speaking with the correct person using two identifiers.  Location: Patient: Home Provider: [facility]   I discussed the limitations of evaluation and management by telemedicine and the availability of in person appointments. The patient expressed understanding and agreed to proceed.  History of Present Illness:  Walter [name] is a 46 year-old black man living with HIV that is been perfectly controlled on Biktarvy.   He has lost 50 pounds through dietary modifications.  He continues to follow with [name] for primary care.  Walter [name] connected to me through video visit.  He is doing well without complaints.      Past Medical History:  Diagnosis Date   Acute esophagitis 10/08/2015   AIDS (HCC) 10/08/2015   COVID-19 virus infection 12/19/2019   Essential hypertension    Hyperglycemia 11/30/2017   Prediabetes 02/2019   Screen for STD (sexually transmitted disease) 01/23/2016   Seasonal allergies    Weight gain 12/14/2018    No past surgical history on file.  Family History  Problem Relation Age of Onset   Hypertension Mother    Diabetes Father       Social History   Socioeconomic History   Marital status: Single    Spouse name: Not on file   Number of children: Not on file   Years of education: Not on file   Highest education level: Not on file  Occupational History   Not on file  Tobacco Use   Smoking status: Never   Smokeless tobacco: Never  Vaping Use   Vaping Use: Never used  Substance and Sexual Activity   Alcohol use: Yes    Comment: occ   Drug use: No   Sexual activity: Not Currently    Partners: Male    Comment: declined condoms  Other Topics Concern   Not on file  Social History Narrative   Not on file   Social Determinants of Health   Financial Resource Strain: Not on file  Food Insecurity:  Not on file  Transportation Needs: Not on file  Physical Activity: Not on file  Stress: Not on file  Social Connections: Not on file    Allergies  Allergen Reactions   Shrimp [Shellfish Allergy] Nausea And Vomiting     Current Outpatient Medications:    Pitavastatin Magnesium 4 MG TABS, Take 4 mg by mouth daily., Disp: 30 tablet, Rfl: 11   bictegravir-emtricitabine-tenofovir AF (BIKTARVY) 50-200-25 MG TABS tablet, Take 1 tablet by mouth daily., Disp: 30 tablet, Rfl: 11   Cyanocobalamin (VITAMIN B 12 PO), Take 1,000 mcg by mouth daily., Disp: , Rfl:    fluticasone (FLONASE) 50 MCG/ACT nasal spray, Place 2 sprays into both nostrils daily., Disp: 16 g, Rfl: 6   ibuprofen (ADVIL,MOTRIN) 200 MG tablet, Take 400 mg by mouth every 6 (six) hours as needed for headache, mild pain or moderate pain. , Disp: , Rfl:    levocetirizine (XYZAL) 5 MG tablet, Take 1 tablet (5 mg total) by mouth every evening., Disp: 30 tablet, Rfl: 6   Multiple Vitamin (MULTIVITAMIN WITH MINERALS) TABS tablet, Take 1 tablet by mouth daily., Disp: , Rfl:    nystatin (MYCOSTATIN) 100000 UNIT/ML suspension, Take 5 mLs (500,000 Units total) by mouth 4 (four) times daily. (swish and spit or swish and swallow), Disp: 60 mL, Rfl: 1  Review of systems as above otherwise  12 point review systems is negative  Observations/Objective: Walter [name] appeared comfortable in no acute distress seems to be doing well  HIV disease: I reviewed his most recent viral load from June 02, 2022 which was less than 20 and explained why this is still considered "undetectable" despite the lab reporting it as "detected.  Along with a CD4 count was healthy at 436 Lab Results  Component Value Date   HIV1RNAQUANT <20 (H) 06/02/2022   His most recent CD4 count from same date and it was 389 Lab Results  Component Value Date   CD4TABS 436 06/02/2022   CD4TABS 389 (L) 11/28/2021   CD4TABS 385 (L) 05/29/2021    I I have sent in prescription for  BIKTARVY.   Prior hypertension: This is improved with weight loss  Prediabetes: Again the weight loss is avoided progressing  Obesity: He is doing tremendous job losing a great deal of weight and maintaining himself at current weight  Seen counseling recommended to get updated flu and COVID-vaccine in September or October  Follow Up Instructions:    I discussed the assessment and treatment plan with the patient. The patient was provided an opportunity to ask questions and all were answered. The patient agreed with the plan and demonstrated an understanding of the instructions.   The patient was advised to call back or seek an in-person evaluation if the symptoms worsen or if the condition fails to improve as anticipated.  I spent 31 minutes with the patient including than 50% of the time in face to face counseling of the patient is HIV prior hypertension obesity, vaccine counseling  along with review of medical records in preparation for the visit and during the visit and in coordination of his  care.    Acey Lav, MD

## 2022-07-29 ENCOUNTER — Encounter: Payer: Self-pay | Admitting: Family Medicine

## 2022-07-29 ENCOUNTER — Ambulatory Visit: Payer: Commercial Managed Care - HMO | Attending: Family Medicine | Admitting: Family Medicine

## 2022-07-29 VITALS — BP 126/80 | HR 66 | Temp 98.4°F | Ht 69.0 in | Wt 239.6 lb

## 2022-07-29 DIAGNOSIS — Z23 Encounter for immunization: Secondary | ICD-10-CM | POA: Diagnosis not present

## 2022-07-29 DIAGNOSIS — Z1211 Encounter for screening for malignant neoplasm of colon: Secondary | ICD-10-CM

## 2022-07-29 DIAGNOSIS — B20 Human immunodeficiency virus [HIV] disease: Secondary | ICD-10-CM

## 2022-07-29 DIAGNOSIS — R7303 Prediabetes: Secondary | ICD-10-CM

## 2022-07-29 LAB — POCT GLYCOSYLATED HEMOGLOBIN (HGB A1C): HbA1c, POC (prediabetic range): 5.3 % — AB (ref 5.7–6.4)

## 2022-07-29 NOTE — Progress Notes (Signed)
Subjective:  Patient ID: Walter [name], male    DOB: 04-Sep-1976  Age: 46 y.o. MRN: [id number]  CC: New Patient (Initial Visit)   HPI Walter [name] is a 46 y.o. year old male with a history of HIV, hypertension, prediabetes who presents today to establish care.  Interval History: He had a video visit with his infectious disease specialist 2 weeks ago.  Viral load from 05/2022 was less than 20, CD4 count was 436.  His hypertension and prediabetes with diet controlled. He walks 1.5 mile daily and is very active . He does not eat meat but eats a lot of fish. Denies additional concerns today.  Past Medical History:  Diagnosis Date   Acute esophagitis 10/08/2015   AIDS (HCC) 10/08/2015   COVID-19 virus infection 12/19/2019   Essential hypertension    Hyperglycemia 11/30/2017   Prediabetes 02/2019   Screen for STD (sexually transmitted disease) 01/23/2016   Seasonal allergies    Weight gain 12/14/2018    No past surgical history on file.  Family History  Problem Relation Age of Onset   Hypertension Mother    Diabetes Father     Social History   Socioeconomic History   Marital status: Single    Spouse name: Not on file   Number of children: Not on file   Years of education: Not on file   Highest education level: Not on file  Occupational History   Not on file  Tobacco Use   Smoking status: Never   Smokeless tobacco: Never  Vaping Use   Vaping Use: Never used  Substance and Sexual Activity   Alcohol use: Yes    Comment: occ   Drug use: No   Sexual activity: Not Currently    Partners: Male    Comment: declined condoms  Other Topics Concern   Not on file  Social History Narrative   Not on file   Social Determinants of Health   Financial Resource Strain: Not on file  Food Insecurity: Not on file  Transportation Needs: Not on file  Physical Activity: Not on file  Stress: Not on file  Social Connections: Not on file    Allergies  Allergen Reactions    Shrimp [Shellfish Allergy] Nausea And Vomiting    Outpatient Medications Prior to Visit  Medication Sig Dispense Refill   bictegravir-emtricitabine-tenofovir AF (BIKTARVY) 50-200-25 MG TABS tablet Take 1 tablet by mouth daily. 30 tablet 11   Cyanocobalamin (VITAMIN B 12 PO) Take 1,000 mcg by mouth daily.     ibuprofen (ADVIL,MOTRIN) 200 MG tablet Take 400 mg by mouth every 6 (six) hours as needed for headache, mild pain or moderate pain.      Multiple Vitamin (MULTIVITAMIN WITH MINERALS) TABS tablet Take 1 tablet by mouth daily.     Pitavastatin Magnesium 4 MG TABS Take 4 mg by mouth daily. 30 tablet 11   fluticasone (FLONASE) 50 MCG/ACT nasal spray Place 2 sprays into both nostrils daily. 16 g 6   levocetirizine (XYZAL) 5 MG tablet Take 1 tablet (5 mg total) by mouth every evening. 30 tablet 6   nystatin (MYCOSTATIN) 100000 UNIT/ML suspension Take 5 mLs (500,000 Units total) by mouth 4 (four) times daily. (swish and spit or swish and swallow) (Patient not taking: Reported on 07/29/2022) 60 mL 1   No facility-administered medications prior to visit.     ROS Review of Systems  Constitutional:  Negative for activity change and appetite change.  HENT:  Negative for sinus pressure  and sore throat.   Respiratory:  Negative for chest tightness, shortness of breath and wheezing.   Cardiovascular:  Negative for chest pain and palpitations.  Gastrointestinal:  Negative for abdominal distention, abdominal pain and constipation.  Genitourinary: Negative.   Musculoskeletal: Negative.   Psychiatric/Behavioral:  Negative for behavioral problems and dysphoric mood.     Objective:  BP 126/80   Pulse 66   Temp 98.4 F (36.9 C) (Oral)   Ht 5\' 9"  (1.753 m)   Wt 239 lb 9.6 oz (108.7 kg)   SpO2 100%   BMI 35.38 kg/m      07/29/2022    9:19 AM 05/22/2021    3:00 AM 05/22/2021    2:00 AM  BP/Weight  Systolic BP 126 126 117  Diastolic BP 80 94 87  Wt. (Lbs) 239.6    BMI 35.38 kg/m2         Physical Exam Constitutional:      Appearance: He is well-developed.  Cardiovascular:     Rate and Rhythm: Normal rate.     Heart sounds: Normal heart sounds. No murmur heard. Pulmonary:     Effort: Pulmonary effort is normal.     Breath sounds: Normal breath sounds. No wheezing or rales.  Chest:     Chest wall: No tenderness.  Abdominal:     General: Bowel sounds are normal. There is no distension.     Palpations: Abdomen is soft. There is no mass.     Tenderness: There is no abdominal tenderness.  Musculoskeletal:        General: Normal range of motion.     Right lower leg: No edema.     Left lower leg: No edema.  Neurological:     Mental Status: He is alert and oriented to person, place, and time.  Psychiatric:        Mood and Affect: Mood normal.        Latest Ref Rng & Units 06/02/2022    9:05 AM 11/28/2021   10:01 AM 05/29/2021    9:30 AM  CMP  Glucose 65 - 99 mg/dL 07/30/2021  765  465   BUN 7 - 25 mg/dL 11  10  13    Creatinine 0.60 - 1.29 mg/dL 035   4.65   Sodium 135 - 146 mmol/L 140  140  139   Potassium 3.5 - 5.3 mmol/L 4.3  4.1  4.5   Chloride 98 - 110 mmol/L 108  108  104   CO2 20 - 32 mmol/L 26  28  29    Calcium 8.6 - 10.3 mg/dL 9.0  9.1  9.5   Total Protein 6.1 - 8.1 g/dL 6.9  7.0  7.3   Total Bilirubin 0.2 - 1.2 mg/dL 0.3  0.4  0.4   AST 10 - 40 U/L 25  21  28    ALT 9 - 46 U/L 20  22  24      Lipid Panel     Component Value Date/Time   CHOL 175 11/28/2021 1001   CHOL 192 12/08/2018 1038   TRIG 58 11/28/2021 1001   HDL 53 11/28/2021 1001   HDL 41 12/08/2018 1038   CHOLHDL 3.3 11/28/2021 1001   VLDL 20 05/11/2017 1032   LDLCALC 108 (H) 11/28/2021 1001    CBC    Component Value Date/Time   WBC 3.6 (L) 06/02/2022 0905   RBC 4.67 06/02/2022 0905   HGB 15.2 06/02/2022 0905   HCT 45.1 06/02/2022 0905   PLT  240 06/02/2022 0905   MCV 96.6 06/02/2022 0905   MCH 32.5 06/02/2022 0905   MCHC 33.7 06/02/2022 0905   RDW 12.7 06/02/2022 0905    LYMPHSABS 1,836 06/02/2022 0905   MONOABS 336 05/11/2017 1032   EOSABS 68 06/02/2022 0905   BASOSABS 61 06/02/2022 0905    Lab Results  Component Value Date   HGBA1C 5.3 (A) 07/29/2022    Assessment & Plan:  1. HIV disease (HCC) Undetectable viral load Currently on antiretroviral therapy Follow-up with infectious disease  2. Prediabetes He is A1c is 5.3 and prediabetes has been reversed due to his significant weight loss Commended on this and advised to continue to work on lifestyle - POCT glycosylated hemoglobin (Hb A1C)  3. Screening for colon cancer - Ambulatory referral to Gastroenterology  4. Need for immunization against influenza - Flu Vaccine QUAD 8mo+IM (Fluarix, Fluzone & Alfiuria Quad PF)    No orders of the defined types were placed in this encounter.   Follow-up: Return in about 6 months (around 01/27/2023).       Hoy Register, MD, FAAFP. [facility] [place], [place] [phone]   07/29/2022, 10:24 AM

## 2022-07-29 NOTE — Patient Instructions (Signed)
Exercising to Stay Healthy To become healthy and stay healthy, it is recommended that you do moderate-intensity and vigorous-intensity exercise. You can tell that you are exercising at a moderate intensity if your heart starts beating faster and you start breathing faster but can still hold a conversation. You can tell that you are exercising at a vigorous intensity if you are breathing much harder and faster and cannot hold a conversation while exercising. How can exercise benefit me? Exercising regularly is important. It has many health benefits, such as: Improving overall fitness, flexibility, and endurance. Increasing bone density. Helping with weight control. Decreasing body fat. Increasing muscle strength and endurance. Reducing stress and tension, anxiety, depression, or anger. Improving overall health. What guidelines should I follow while exercising? Before you start a new exercise program, talk with your health care provider. Do not exercise so much that you hurt yourself, feel dizzy, or get very short of breath. Wear comfortable clothes and wear shoes with good support. Drink plenty of water while you exercise to prevent dehydration or heat stroke. Work out until your breathing and your heartbeat get faster (moderate intensity). How often should I exercise? Choose an activity that you enjoy, and set realistic goals. Your health care provider can help you make an activity plan that is individually designed and works best for you. Exercise regularly as told by your health care provider. This may include: Doing strength training two times a week, such as: Lifting weights. Using resistance bands. Push-ups. Sit-ups. Yoga. Doing a certain intensity of exercise for a given amount of time. Choose from these options: A total of 150 minutes of moderate-intensity exercise every week. A total of 75 minutes of vigorous-intensity exercise every week. A mix of moderate-intensity and  vigorous-intensity exercise every week. Children, pregnant women, people who have not exercised regularly, people who are overweight, and older adults may need to talk with a health care provider about what activities are safe to perform. If you have a medical condition, be sure to talk with your health care provider before you start a new exercise program. What are some exercise ideas? Moderate-intensity exercise ideas include: Walking 1 mile (1.6 km) in about 15 minutes. Biking. Hiking. Golfing. Dancing. Water aerobics. Vigorous-intensity exercise ideas include: Walking 4.5 miles (7.2 km) or more in about 1 hour. Jogging or running 5 miles (8 km) in about 1 hour. Biking 10 miles (16.1 km) or more in about 1 hour. Lap swimming. Roller-skating or in-line skating. Cross-country skiing. Vigorous competitive sports, such as football, basketball, and soccer. Jumping rope. Aerobic dancing. What are some everyday activities that can help me get exercise? Yard work, such as: Pushing a lawn mower. Raking and bagging leaves. Washing your car. Pushing a stroller. Shoveling snow. Gardening. Washing windows or floors. How can I be more active in my day-to-day activities? Use stairs instead of an elevator. Take a walk during your lunch break. If you drive, park your car farther away from your work or school. If you take public transportation, get off one stop early and walk the rest of the way. Stand up or walk around during all of your indoor phone calls. Get up, stretch, and walk around every 30 minutes throughout the day. Enjoy exercise with a friend. Support to continue exercising will help you keep a regular routine of activity. Where to find more information You can find more information about exercising to stay healthy from: U.S. Department of Health and Human Services: www.hhs.gov Centers for Disease Control and Prevention (  CDC): www.cdc.gov Summary Exercising regularly is  important. It will improve your overall fitness, flexibility, and endurance. Regular exercise will also improve your overall health. It can help you control your weight, reduce stress, and improve your bone density. Do not exercise so much that you hurt yourself, feel dizzy, or get very short of breath. Before you start a new exercise program, talk with your health care provider. This information is not intended to replace advice given to you by your health care provider. Make sure you discuss any questions you have with your health care provider. Document Revised: 03/08/2021 Document Reviewed: 03/08/2021 Elsevier Patient Education  2023 [employer] Inc[employer]  

## 2022-08-18 ENCOUNTER — Telehealth: Payer: Self-pay

## 2022-08-18 NOTE — Telephone Encounter (Signed)
Patient called this morning stated that pharmacy indicated PA required for pitavastatin. Patient stated pharmacy fill prescription once, but he has now been out of pitavastatin for 4 days.  PA was sent on 08/14/22, per review of Covermymeds. Still pending.  Patient requests advice on whether prescription should be changed to a different covered medication.  Request routed to provider.  Binnie Kand, RN

## 2022-08-18 NOTE — Telephone Encounter (Signed)
Hey Dr. Tommy Medal for the patient can you send in Atorvastatin, Lovastatin or Simvastatin, which are preferred by patient's insurance

## 2022-08-19 ENCOUNTER — Other Ambulatory Visit: Payer: Self-pay | Admitting: Infectious Disease

## 2022-08-19 MED ORDER — ATORVASTATIN CALCIUM 40 MG PO TABS: 40.0000 mg | ORAL_TABLET | Freq: Every day | ORAL | 11 refills | Status: DC

## 2022-08-27 ENCOUNTER — Ambulatory Visit: Payer: Commercial Managed Care - HMO

## 2022-09-02 ENCOUNTER — Encounter: Payer: Commercial Managed Care - HMO | Admitting: Internal Medicine

## 2022-10-03 ENCOUNTER — Telehealth: Payer: Self-pay | Admitting: *Deleted

## 2022-10-03 NOTE — Telephone Encounter (Signed)
No return phone call.  Missed appointment letter mailed and procedure to be cancelled 10/06/22 if patient does not reschedule PV.

## 2022-10-03 NOTE — Telephone Encounter (Signed)
Patient no show for pre visit and unable to reach on phone.  LVM requesting patient  to call and reschedule PV to avoid cancellation of colonoscopy on 11/03/22

## 2022-11-02 ENCOUNTER — Telehealth: Payer: Commercial Managed Care - HMO | Admitting: Physician Assistant

## 2022-11-02 DIAGNOSIS — B9689 Other specified bacterial agents as the cause of diseases classified elsewhere: Secondary | ICD-10-CM | POA: Diagnosis not present

## 2022-11-02 DIAGNOSIS — J019 Acute sinusitis, unspecified: Secondary | ICD-10-CM | POA: Diagnosis not present

## 2022-11-02 MED ORDER — AMOXICILLIN-POT CLAVULANATE 875-125 MG PO TABS: 1.0000 | ORAL_TABLET | Freq: Two times a day (BID) | ORAL | 0 refills | Status: DC

## 2022-11-02 NOTE — Progress Notes (Signed)

## 2022-11-03 ENCOUNTER — Encounter: Payer: Commercial Managed Care - HMO | Admitting: Internal Medicine

## 2022-12-01 ENCOUNTER — Other Ambulatory Visit: Payer: BC Managed Care – PPO

## 2022-12-01 ENCOUNTER — Other Ambulatory Visit: Payer: Self-pay

## 2022-12-01 ENCOUNTER — Ambulatory Visit: Payer: Self-pay

## 2022-12-01 DIAGNOSIS — B20 Human immunodeficiency virus [HIV] disease: Secondary | ICD-10-CM | POA: Diagnosis not present

## 2022-12-01 DIAGNOSIS — Z79899 Other long term (current) drug therapy: Secondary | ICD-10-CM | POA: Diagnosis not present

## 2022-12-03 LAB — LIPID PANEL
Cholesterol: 121 mg/dL (ref <200)
HDL: 57 mg/dL (ref >=40)
LDL Cholesterol (Calc): 51 mg/dL (calc)
Non-HDL Cholesterol (Calc): 64 mg/dL (calc) (ref <130)
Total CHOL/HDL Ratio: 2.1 (calc) (ref <5.0)
Triglycerides: 47 mg/dL (ref <150)

## 2022-12-03 LAB — COMPLETE METABOLIC PANEL WITH GFR
AG Ratio: 1.6 (calc) (ref 1.0–2.5)
ALT: 30 U/L (ref 9–46)
AST: 34 U/L (ref 10–40)
Albumin: 4.4 g/dL (ref 3.6–5.1)
Alkaline phosphatase (APISO): 78 U/L (ref 36–130)
BUN: 10 mg/dL (ref 7–25)
CO2: 27 mmol/L (ref 20–32)
Calcium: 9.1 mg/dL (ref 8.6–10.3)
Chloride: 105 mmol/L (ref 98–110)
Creat: 1.24 mg/dL (ref 0.60–1.29)
Globulin: 2.8 g/dL (calc) (ref 1.9–3.7)
Glucose, Bld: 104 mg/dL — ABNORMAL HIGH (ref 65–99)
Potassium: 4.1 mmol/L (ref 3.5–5.3)
Sodium: 140 mmol/L (ref 135–146)
Total Bilirubin: 0.8 mg/dL (ref 0.2–1.2)
Total Protein: 7.2 g/dL (ref 6.1–8.1)
eGFR: 73 mL/min/{1.73_m2} (ref >=60)

## 2022-12-03 LAB — CBC WITH DIFFERENTIAL/PLATELET
Absolute Monocytes: 351 cells/uL (ref 200–950)
Basophils Absolute: 59 cells/uL (ref 0–200)
Basophils Relative: 1.5 %
Eosinophils Absolute: 82 cells/uL (ref 15–500)
Eosinophils Relative: 2.1 %
HCT: 44.7 % (ref 38.5–50.0)
Hemoglobin: 15.5 g/dL (ref 13.2–17.1)
Lymphs Abs: 1689 cells/uL (ref 850–3900)
MCH: 32.8 pg (ref 27.0–33.0)
MCHC: 34.7 g/dL (ref 32.0–36.0)
MCV: 94.7 fL (ref 80.0–100.0)
MPV: 9.5 fL (ref 7.5–12.5)
Monocytes Relative: 9 %
Neutro Abs: 1720 cells/uL (ref 1500–7800)
Neutrophils Relative %: 44.1 %
Platelets: 237 10*3/uL (ref 140–400)
RBC: 4.72 10*6/uL (ref 4.20–5.80)
RDW: 12.2 % (ref 11.0–15.0)
Total Lymphocyte: 43.3 %
WBC: 3.9 10*3/uL (ref 3.8–10.8)

## 2022-12-03 LAB — HELPER T-LYMPH-CD4 (ARMC ONLY)
% CD 4 Pos. Lymph.: 27.2 % — ABNORMAL LOW (ref 30.8–58.5)
Absolute CD 4 Helper: 490 /uL (ref 359–1519)
Basophils Absolute: 0.1 10*3/uL (ref 0.0–0.2)
Basos: 1 %
EOS (ABSOLUTE): 0.1 10*3/uL (ref 0.0–0.4)
Eos: 2 %
Hematocrit: 47.8 % (ref 37.5–51.0)
Hemoglobin: 15.5 g/dL (ref 13.0–17.7)
Immature Grans (Abs): 0 10*3/uL (ref 0.0–0.1)
Immature Granulocytes: 0 %
Lymphocytes Absolute: 1.8 10*3/uL (ref 0.7–3.1)
Lymphs: 44 %
MCH: 32.3 pg (ref 26.6–33.0)
MCHC: 32.4 g/dL (ref 31.5–35.7)
MCV: 100 fL — ABNORMAL HIGH (ref 79–97)
Monocytes Absolute: 0.4 10*3/uL (ref 0.1–0.9)
Monocytes: 9 %
Neutrophils Absolute: 1.8 10*3/uL (ref 1.4–7.0)
Neutrophils: 44 %
Platelets: 261 10*3/uL (ref 150–450)
RBC: 4.8 x10E6/uL (ref 4.14–5.80)
RDW: 12.6 % (ref 11.6–15.4)
WBC: 4 10*3/uL (ref 3.4–10.8)

## 2022-12-03 LAB — HIV-1 RNA QUANT-NO REFLEX-BLD
HIV 1 RNA Quant: NOT DETECTED Copies/mL
HIV-1 RNA Quant, Log: NOT DETECTED Log cps/mL

## 2022-12-03 LAB — RPR: RPR Ser Ql: NONREACTIVE

## 2022-12-14 ENCOUNTER — Encounter: Payer: Self-pay | Admitting: Infectious Disease

## 2022-12-14 DIAGNOSIS — E785 Hyperlipidemia, unspecified: Secondary | ICD-10-CM

## 2022-12-14 HISTORY — DX: Hyperlipidemia, unspecified: E78.5

## 2022-12-14 NOTE — Progress Notes (Signed)
Virtual Visit via Video Note  I connected with Walter [name] on 12/14/22 at  8:45 AM EST by a video enabled telemedicine application and verified that I am speaking with the correct person using two identifiers.  Location: Patient: Home Provider: [facility]   I discussed the limitations of evaluation and management by telemedicine and the availability of in person appointments. The patient expressed understanding and agreed to proceed.  History of Present Illness:  Walter [name] is a 47 year-old black man living with HIV that is been perfectly controlled on Biktarvy.   He has lost 50 pounds through dietary modifications.  Now established with Walter [name] for Primary Care   Noted him on Lipitor last time based on the results of the reprieve study he is tolerating this quite well with his BIKTARVY.  He is maintaining his weight off.  His A1c remains in in the 5 range.  Referred for colonoscopy but could not make time for appointment because of his work he would like to reschedule this.  Call the center where he is scheduled and arrange with them.  He has had his updated flu shot and COVID shot.  Systems as above otherwise 12 point review systems negative     Past Medical History:  Diagnosis Date   Acute esophagitis 10/08/2015   AIDS ([place]) 10/08/2015   COVID-19 virus infection 12/19/2019   Essential hypertension    Hyperglycemia 11/30/2017   Prediabetes 02/2019   Screen for STD (sexually transmitted disease) 01/23/2016   Seasonal allergies    Weight gain 12/14/2018    No past surgical history on file.  Family History  Problem Relation Age of Onset   Hypertension Mother    Diabetes Father       Social History   Socioeconomic History   Marital status: Single    Spouse name: Not on file   Number of children: Not on file   Years of education: Not on file   Highest education level: Not on file  Occupational History   Not on file  Tobacco Use   Smoking status: Never    Smokeless tobacco: Never  Vaping Use   Vaping Use: Never used  Substance and Sexual Activity   Alcohol use: Yes    Comment: occ   Drug use: No   Sexual activity: Not Currently    Partners: Male    Comment: declined condoms  Other Topics Concern   Not on file  Social History Narrative   Not on file   Social Determinants of Health   Financial Resource Strain: Not on file  Food Insecurity: Not on file  Transportation Needs: Not on file  Physical Activity: Not on file  Stress: Not on file  Social Connections: Not on file    Allergies  Allergen Reactions   Shrimp [Shellfish Allergy] Nausea And Vomiting     Current Outpatient Medications:    amoxicillin-clavulanate (AUGMENTIN) 875-125 MG tablet, Take 1 tablet by mouth 2 (two) times daily., Disp: 14 tablet, Rfl: 0   atorvastatin (LIPITOR) 40 MG tablet, Take 1 tablet (40 mg total) by mouth daily., Disp: 30 tablet, Rfl: 11   bictegravir-emtricitabine-tenofovir AF (BIKTARVY) 50-200-25 MG TABS tablet, Take 1 tablet by mouth daily., Disp: 30 tablet, Rfl: 11   Cyanocobalamin (VITAMIN B 12 PO), Take 1,000 mcg by mouth daily., Disp: , Rfl:    ibuprofen (ADVIL,MOTRIN) 200 MG tablet, Take 400 mg by mouth every 6 (six) hours as needed for headache, mild pain or moderate pain. , Disp: ,  Rfl:    Multiple Vitamin (MULTIVITAMIN WITH MINERALS) TABS tablet, Take 1 tablet by mouth daily., Disp: , Rfl:   Review of systems as above otherwise 12 point review systems is negative  Observations/Objective: Walter [name] appeared comfortable in no acute distress seems to be doing well  HIV disease:   HIV disease:  I have reviewed Walter [name]'s labs including viral load which was  Lab Results  Component Value Date   HIV1RNAQUANT Not Detected 12/01/2022   and cd4 which was  Lab Results  Component Value Date   CD4TABS 436 06/02/2022     I am continuing patient's prescription for Biktarvy  Hyperlipidemia: based on REPRIEVE study I will  continue Atorvastatin  Htn: Not needed meds since he has lost weight  Obesity: He is now down to a size 34th of a 44 waist  Vaccine counseling: Does not need to had his COVD 19 booster updated one, and needs DTap  Colon cancer screening: he has referral to GI   Follow Up Instructions:    I discussed the assessment and treatment plan with the patient. The patient was provided an opportunity to ask questions and all were answered. The patient agreed with the plan and demonstrated an understanding of the instructions.   The patient was advised to call back or seek an in-person evaluation if the symptoms worsen or if the condition fails to improve as anticipated.  I spent 32 minutes with the patient including than 50% of the time in face to face counseling of the patient is HIV prior hypertension obesity, vaccine counseling  along with review of medical records in preparation for the visit and during the visit and in coordination of his  care.    Walter Evener, MD

## 2022-12-15 ENCOUNTER — Telehealth: Payer: BC Managed Care – PPO | Admitting: Infectious Disease

## 2022-12-15 ENCOUNTER — Telehealth: Payer: Self-pay

## 2022-12-15 ENCOUNTER — Other Ambulatory Visit (HOSPITAL_COMMUNITY): Payer: Self-pay

## 2022-12-15 ENCOUNTER — Other Ambulatory Visit: Payer: Self-pay

## 2022-12-15 DIAGNOSIS — Z6839 Body mass index (BMI) 39.0-39.9, adult: Secondary | ICD-10-CM

## 2022-12-15 DIAGNOSIS — E785 Hyperlipidemia, unspecified: Secondary | ICD-10-CM

## 2022-12-15 DIAGNOSIS — Z113 Encounter for screening for infections with a predominantly sexual mode of transmission: Secondary | ICD-10-CM

## 2022-12-15 DIAGNOSIS — B20 Human immunodeficiency virus [HIV] disease: Secondary | ICD-10-CM

## 2022-12-15 DIAGNOSIS — I10 Essential (primary) hypertension: Secondary | ICD-10-CM

## 2022-12-15 MED ORDER — BIKTARVY 50-200-25 MG PO TABS
1.0000 | ORAL_TABLET | Freq: Every day | ORAL | 11 refills | Status: DC
  Filled 2022-12-15 – 2022-12-16 (×2): qty 30, 30d supply, fill #0
  Filled 2023-01-06: qty 30, 30d supply, fill #1
  Filled 2023-02-03: qty 30, 30d supply, fill #2
  Filled 2023-03-02: qty 30, 30d supply, fill #3
  Filled 2023-03-26 – 2023-03-30 (×3): qty 30, 30d supply, fill #4
  Filled 2023-04-21: qty 30, 30d supply, fill #5
  Filled 2023-05-25 – 2023-06-02 (×3): qty 30, 30d supply, fill #6
  Filled 2023-06-18: qty 30, 30d supply, fill #7
  Filled 2023-07-21: qty 30, 30d supply, fill #8
  Filled 2023-08-13: qty 30, 30d supply, fill #9
  Filled 2023-09-08: qty 30, 30d supply, fill #10
  Filled 2023-10-01: qty 30, 30d supply, fill #11

## 2022-12-15 MED ORDER — ATORVASTATIN CALCIUM 40 MG PO TABS
40.0000 mg | ORAL_TABLET | Freq: Every day | ORAL | 11 refills | Status: DC
  Filled 2022-12-15: qty 30, 30d supply, fill #0
  Filled 2022-12-16: qty 90, 90d supply, fill #0
  Filled 2023-03-11: qty 30, 30d supply, fill #1
  Filled 2023-04-10: qty 30, 30d supply, fill #2
  Filled 2023-05-10: qty 30, 30d supply, fill #3
  Filled 2023-06-13: qty 30, 30d supply, fill #4
  Filled 2023-07-12: qty 30, 30d supply, fill #5
  Filled 2023-08-10: qty 30, 30d supply, fill #6
  Filled 2023-09-11: qty 30, 30d supply, fill #7
  Filled 2023-10-01: qty 30, 30d supply, fill #8

## 2022-12-15 NOTE — Telephone Encounter (Signed)
[facility] Patient Advocate Encounter °  °Was successful in obtaining a [employer] copay card for Biktarvy.  This copay card will make the patients copay 0.00. ° °I have spoken with the patient.   ° °The billing information is as follows and has been shared with [facility]. ° ° ° ° ° ° ° °Gwenetta Devos, CPhT °Specialty Pharmacy Patient Advocate °[facility] °Phone: [phone] °Fax:  [phone]  °

## 2022-12-16 ENCOUNTER — Other Ambulatory Visit: Payer: Self-pay

## 2022-12-16 ENCOUNTER — Other Ambulatory Visit (HOSPITAL_COMMUNITY): Payer: Self-pay

## 2022-12-23 ENCOUNTER — Other Ambulatory Visit (HOSPITAL_COMMUNITY): Payer: Self-pay

## 2022-12-29 ENCOUNTER — Telehealth: Payer: BC Managed Care – PPO | Admitting: Family Medicine

## 2022-12-29 ENCOUNTER — Other Ambulatory Visit (HOSPITAL_COMMUNITY): Payer: Self-pay

## 2022-12-29 DIAGNOSIS — J069 Acute upper respiratory infection, unspecified: Secondary | ICD-10-CM

## 2022-12-29 MED ORDER — BENZONATATE 100 MG PO CAPS
100.0000 mg | ORAL_CAPSULE | Freq: Three times a day (TID) | ORAL | 0 refills | Status: DC | PRN
  Filled 2022-12-29: qty 20, 7d supply, fill #0

## 2022-12-29 MED ORDER — FLUTICASONE PROPIONATE 50 MCG/ACT NA SUSP
2.0000 | Freq: Every day | NASAL | 0 refills | Status: DC
  Filled 2022-12-29: qty 16, 30d supply, fill #0

## 2022-12-29 NOTE — Progress Notes (Signed)

## 2023-01-06 ENCOUNTER — Other Ambulatory Visit (HOSPITAL_COMMUNITY): Payer: Self-pay

## 2023-01-07 ENCOUNTER — Other Ambulatory Visit: Payer: Self-pay

## 2023-01-08 ENCOUNTER — Other Ambulatory Visit: Payer: Self-pay

## 2023-01-08 ENCOUNTER — Other Ambulatory Visit (HOSPITAL_COMMUNITY): Payer: Self-pay

## 2023-01-16 ENCOUNTER — Other Ambulatory Visit (HOSPITAL_COMMUNITY): Payer: Self-pay

## 2023-01-19 ENCOUNTER — Other Ambulatory Visit (HOSPITAL_COMMUNITY): Payer: Self-pay

## 2023-01-27 ENCOUNTER — Ambulatory Visit: Payer: 59 | Attending: Family Medicine | Admitting: Family Medicine

## 2023-01-27 ENCOUNTER — Encounter: Payer: Self-pay | Admitting: Family Medicine

## 2023-01-27 ENCOUNTER — Other Ambulatory Visit (HOSPITAL_COMMUNITY): Payer: Self-pay

## 2023-01-27 VITALS — BP 121/81 | HR 60 | Temp 98.2°F | Ht 69.0 in | Wt 241.8 lb

## 2023-01-27 DIAGNOSIS — B20 Human immunodeficiency virus [HIV] disease: Secondary | ICD-10-CM

## 2023-01-27 DIAGNOSIS — I10 Essential (primary) hypertension: Secondary | ICD-10-CM

## 2023-01-27 DIAGNOSIS — R7303 Prediabetes: Secondary | ICD-10-CM

## 2023-01-27 DIAGNOSIS — J328 Other chronic sinusitis: Secondary | ICD-10-CM

## 2023-01-27 LAB — POCT GLYCOSYLATED HEMOGLOBIN (HGB A1C): HbA1c, POC (controlled diabetic range): 5.5 % (ref 0.0–7.0)

## 2023-01-27 MED ORDER — PREDNISONE 20 MG PO TABS
20.0000 mg | ORAL_TABLET | Freq: Every day | ORAL | 0 refills | Status: DC
  Filled 2023-01-27: qty 5, 5d supply, fill #0

## 2023-01-27 NOTE — Patient Instructions (Addendum)
Please call to schedule your colonoscopy appointment: [facility] [address] [place], [place] [address] PH# [phone]

## 2023-01-27 NOTE — Progress Notes (Signed)
Subjective:  Patient ID: Walter [name], male    DOB: 11-Oct-1976  Age: 47 y.o. MRN: [id number]  CC: Prediabetes   HPI Walter [name] is a 47 y.o. year old male with a history of  HIV, hypertension, prediabetes.  Interval History:  He Complains of sinus symptoms with maxillary pressure, nasal congestion, sinus headache, cough. He uses Flonase but has noticed with sinus tabs he experiences rhinorrhea. Symptoms are worse at night with associated sore throat have been present x3 weeks. Denies presence of fever. In 11/2022 he did receive Augmentin for his symptoms.  His hypertension is diet controlled and so is his prediabetes.Marland Kitchen  He remains on a statin by infectious disease per notes based on REPRIEVE study.  He closely follows up with infectious disease and remains on his antiretroviral therapy. Past Medical History:  Diagnosis Date   Acute esophagitis 10/08/2015   AIDS ([place]) 10/08/2015   COVID-19 virus infection 12/19/2019   Essential hypertension    Hyperglycemia 11/30/2017   Hyperlipidemia 12/14/2022   Prediabetes 02/2019   Screen for STD (sexually transmitted disease) 01/23/2016   Seasonal allergies    Weight gain 12/14/2018    No past surgical history on file.  Family History  Problem Relation Age of Onset   Hypertension Mother    Diabetes Father     Social History   Socioeconomic History   Marital status: Single    Spouse name: Not on file   Number of children: Not on file   Years of education: Not on file   Highest education level: Not on file  Occupational History   Not on file  Tobacco Use   Smoking status: Never   Smokeless tobacco: Never  Vaping Use   Vaping Use: Never used  Substance and Sexual Activity   Alcohol use: Yes    Comment: occ   Drug use: No   Sexual activity: Not Currently    Partners: Male    Comment: declined condoms  Other Topics Concern   Not on file  Social History Narrative   Not on file   Social Determinants of Health    Financial Resource Strain: Not on file  Food Insecurity: Not on file  Transportation Needs: Not on file  Physical Activity: Not on file  Stress: Not on file  Social Connections: Not on file    No Known Allergies  Outpatient Medications Prior to Visit  Medication Sig Dispense Refill   bictegravir-emtricitabine-tenofovir AF (BIKTARVY) 50-200-25 MG TABS tablet Take 1 tablet by mouth daily. 30 tablet 11   fluticasone (FLONASE) 50 MCG/ACT nasal spray Place 2 sprays into both nostrils daily. 16 g 0   atorvastatin (LIPITOR) 40 MG tablet Take 1 tablet (40 mg total) by mouth daily. (Patient not taking: Reported on 01/27/2023) 30 tablet 11   amoxicillin-clavulanate (AUGMENTIN) 875-125 MG tablet Take 1 tablet by mouth 2 (two) times daily. (Patient not taking: Reported on 01/27/2023) 14 tablet 0   benzonatate (TESSALON) 100 MG capsule Take 1 capsule (100 mg total) by mouth 3 (three) times daily as needed for cough. (Patient not taking: Reported on 01/27/2023) 20 capsule 0   Cyanocobalamin (VITAMIN B 12 PO) Take 1,000 mcg by mouth daily. (Patient not taking: Reported on 01/27/2023)     No facility-administered medications prior to visit.     ROS Review of Systems  Constitutional:  Negative for activity change and appetite change.  HENT:  Positive for congestion, postnasal drip, sinus pressure and sore throat.   Respiratory:  Negative for chest tightness, shortness of breath and wheezing.   Cardiovascular:  Negative for chest pain and palpitations.  Gastrointestinal:  Negative for abdominal distention, abdominal pain and constipation.  Genitourinary: Negative.   Musculoskeletal: Negative.   Psychiatric/Behavioral:  Negative for behavioral problems and dysphoric mood.     Objective:  BP 121/81   Pulse 60   Temp 98.2 F (36.8 C) (Oral)   Ht '5\' 9"'$  (1.753 m)   Wt 241 lb 12.8 oz (109.7 kg)   SpO2 100%   BMI 35.71 kg/m      01/27/2023    9:04 AM 07/29/2022    9:19 AM 05/22/2021    3:00 AM   BP/Weight  Systolic BP 123XX123 123XX123 123XX123  Diastolic BP 81 80 94  Wt. (Lbs) 241.8 239.6   BMI 35.71 kg/m2 35.38 kg/m2       Physical Exam Constitutional:      Appearance: He is well-developed.  HENT:     Head:     Comments: No sinus tenderness    Right Ear: Tympanic membrane normal.     Left Ear: Tympanic membrane normal.  Cardiovascular:     Rate and Rhythm: Normal rate.     Heart sounds: Normal heart sounds. No murmur heard. Pulmonary:     Effort: Pulmonary effort is normal.     Breath sounds: Normal breath sounds. No wheezing or rales.  Chest:     Chest wall: No tenderness.  Abdominal:     General: Bowel sounds are normal. There is no distension.     Palpations: Abdomen is soft. There is no mass.     Tenderness: There is no abdominal tenderness.  Musculoskeletal:        General: Normal range of motion.     Right lower leg: No edema.     Left lower leg: No edema.  Neurological:     Mental Status: He is alert and oriented to person, place, and time.  Psychiatric:        Mood and Affect: Mood normal.        Latest Ref Rng & Units 12/01/2022    8:47 AM 06/02/2022    9:05 AM 11/28/2021   10:01 AM  CMP  Glucose 65 - 99 mg/dL 104  105  114   BUN 7 - 25 mg/dL '10  11  10   '$ Creatinine 0.60 - 1.29 mg/dL 1.24  1.15  1.05   Sodium 135 - 146 mmol/L 140  140  140   Potassium 3.5 - 5.3 mmol/L 4.1  4.3  4.1   Chloride 98 - 110 mmol/L 105  108  108   CO2 20 - 32 mmol/L '27  26  28   '$ Calcium 8.6 - 10.3 mg/dL 9.1  9.0  9.1   Total Protein 6.1 - 8.1 g/dL 7.2  6.9  7.0   Total Bilirubin 0.2 - 1.2 mg/dL 0.8  0.3  0.4   AST 10 - 40 U/L 34  25  21   ALT 9 - 46 U/L '30  20  22     '$ Lipid Panel     Component Value Date/Time   CHOL 121 12/01/2022 0847   CHOL 192 12/08/2018 1038   TRIG 47 12/01/2022 0847   HDL 57 12/01/2022 0847   HDL 41 12/08/2018 1038   CHOLHDL 2.1 12/01/2022 0847   VLDL 20 05/11/2017 1032   LDLCALC 51 12/01/2022 0847    CBC    Component Value Date/Time   WBC  3.9 12/01/2022 0847   WBC 4.0 12/01/2022 0847   RBC 4.72 12/01/2022 0847   RBC 4.80 12/01/2022 0847   HGB 15.5 12/01/2022 0847   HGB 15.5 12/01/2022 0847   HCT 44.7 12/01/2022 0847   HCT 47.8 12/01/2022 0847   PLT 237 12/01/2022 0847   PLT 261 12/01/2022 0847   MCV 94.7 12/01/2022 0847   MCV 100 (H) 12/01/2022 0847   MCH 32.8 12/01/2022 0847   MCH 32.3 12/01/2022 0847   MCHC 34.7 12/01/2022 0847   MCHC 32.4 12/01/2022 0847   RDW 12.2 12/01/2022 0847   RDW 12.6 12/01/2022 0847   LYMPHSABS 1,689 12/01/2022 0847   LYMPHSABS 1.8 12/01/2022 0847   MONOABS 336 05/11/2017 1032   EOSABS 82 12/01/2022 0847   EOSABS 0.1 12/01/2022 0847   BASOSABS 59 12/01/2022 0847   BASOSABS 0.1 12/01/2022 0847    Lab Results  Component Value Date   HGBA1C 5.3 (A) 07/29/2022    Assessment & Plan:  1. Prediabetes Labs reveal prediabetes with an A1c of 5.5.  Working on a low carbohydrate diet, exercise, weight loss is recommended in order to prevent progression to type 2 diabetes mellitus.  - POCT glycosylated hemoglobin (Hb A1C)  2. Other chronic sinusitis Continue Flonase Provided with nasal irrigation kit - predniSONE (DELTASONE) 20 MG tablet; Take 1 tablet (20 mg total) by mouth daily with breakfast.  Dispense: 5 tablet; Refill: 0  3. HIV disease ([place]) Stable Continue antiretroviral therapy Follow-up with infectious disease  4. Primary hypertension Diet controlled   Health Care Maintenance: I had previously referred him for colonoscopy but he had to cancel due to starting a new job.  Provided the number to reschedule. Meds ordered this encounter  Medications   predniSONE (DELTASONE) 20 MG tablet    Sig: Take 1 tablet (20 mg total) by mouth daily with breakfast.    Dispense:  5 tablet    Refill:  0    Follow-up: Return in about 6 months (around 07/30/2023) for Chronic medical conditions.       Charlott Rakes, MD, FAAFP. [facility], [place]   01/27/2023, 9:19 AM

## 2023-02-03 ENCOUNTER — Other Ambulatory Visit (HOSPITAL_COMMUNITY): Payer: Self-pay

## 2023-02-04 ENCOUNTER — Other Ambulatory Visit (HOSPITAL_COMMUNITY): Payer: Self-pay

## 2023-03-02 ENCOUNTER — Other Ambulatory Visit (HOSPITAL_COMMUNITY): Payer: Self-pay

## 2023-03-02 ENCOUNTER — Other Ambulatory Visit: Payer: Self-pay

## 2023-03-03 ENCOUNTER — Other Ambulatory Visit (HOSPITAL_COMMUNITY): Payer: Self-pay

## 2023-03-12 ENCOUNTER — Other Ambulatory Visit (HOSPITAL_COMMUNITY): Payer: Self-pay

## 2023-03-26 ENCOUNTER — Other Ambulatory Visit (HOSPITAL_COMMUNITY): Payer: Self-pay

## 2023-03-27 ENCOUNTER — Other Ambulatory Visit (HOSPITAL_COMMUNITY): Payer: Self-pay

## 2023-03-27 ENCOUNTER — Other Ambulatory Visit: Payer: Self-pay

## 2023-03-30 ENCOUNTER — Other Ambulatory Visit: Payer: Self-pay

## 2023-03-30 ENCOUNTER — Telehealth: Payer: Self-pay

## 2023-03-30 ENCOUNTER — Other Ambulatory Visit (HOSPITAL_COMMUNITY): Payer: Self-pay

## 2023-03-30 NOTE — Telephone Encounter (Signed)
[facility] Patient Advocate Encounter   I was successful in securing patient a $5,000.00 grant from [employer] ([employer]) to provide copayment coverage for Biktarvy.  This will make the out of pocket cost $0.00.     I have spoken with the patient.    The billing information is as follows and has been shared with [facility].             Patient knows to call the office with questions or concerns.  Takisha Pelle, CPhT Specialty Pharmacy Patient Advocate [facility] Phone: [phone] Fax:  [phone]  

## 2023-03-31 ENCOUNTER — Other Ambulatory Visit (HOSPITAL_COMMUNITY): Payer: Self-pay

## 2023-03-31 ENCOUNTER — Other Ambulatory Visit: Payer: Self-pay

## 2023-04-21 ENCOUNTER — Other Ambulatory Visit (HOSPITAL_COMMUNITY): Payer: Self-pay

## 2023-04-22 ENCOUNTER — Other Ambulatory Visit (HOSPITAL_COMMUNITY): Payer: Self-pay

## 2023-04-23 ENCOUNTER — Other Ambulatory Visit (HOSPITAL_COMMUNITY): Payer: Self-pay

## 2023-04-23 ENCOUNTER — Other Ambulatory Visit: Payer: Self-pay

## 2023-05-20 ENCOUNTER — Other Ambulatory Visit: Payer: Self-pay

## 2023-05-22 ENCOUNTER — Other Ambulatory Visit: Payer: Self-pay

## 2023-05-25 ENCOUNTER — Other Ambulatory Visit: Payer: Self-pay

## 2023-05-25 ENCOUNTER — Telehealth: Payer: 59 | Admitting: Physician Assistant

## 2023-05-25 ENCOUNTER — Other Ambulatory Visit (HOSPITAL_COMMUNITY): Payer: Self-pay

## 2023-05-25 DIAGNOSIS — J019 Acute sinusitis, unspecified: Secondary | ICD-10-CM | POA: Diagnosis not present

## 2023-05-25 DIAGNOSIS — B9689 Other specified bacterial agents as the cause of diseases classified elsewhere: Secondary | ICD-10-CM

## 2023-05-26 ENCOUNTER — Other Ambulatory Visit (HOSPITAL_COMMUNITY): Payer: Self-pay

## 2023-05-26 MED ORDER — AMOXICILLIN-POT CLAVULANATE 875-125 MG PO TABS
1.0000 | ORAL_TABLET | Freq: Two times a day (BID) | ORAL | 0 refills | Status: DC
  Filled 2023-05-26: qty 14, 7d supply, fill #0

## 2023-05-26 NOTE — Progress Notes (Signed)

## 2023-05-26 NOTE — Progress Notes (Signed)
I have spent 5 minutes in review of e-visit questionnaire, review and updating patient chart, medical decision making and response to patient.   Alic Hilburn Cody Barnie Sopko, PA-C    

## 2023-05-27 ENCOUNTER — Other Ambulatory Visit (HOSPITAL_COMMUNITY): Payer: Self-pay

## 2023-06-01 ENCOUNTER — Other Ambulatory Visit (HOSPITAL_COMMUNITY): Payer: Self-pay

## 2023-06-02 ENCOUNTER — Other Ambulatory Visit (HOSPITAL_COMMUNITY): Payer: Self-pay

## 2023-06-17 ENCOUNTER — Other Ambulatory Visit (HOSPITAL_COMMUNITY): Payer: Self-pay

## 2023-06-18 ENCOUNTER — Other Ambulatory Visit (HOSPITAL_COMMUNITY): Payer: Self-pay

## 2023-06-26 ENCOUNTER — Other Ambulatory Visit: Payer: Self-pay

## 2023-06-29 DIAGNOSIS — F320 Major depressive disorder, single episode, mild: Secondary | ICD-10-CM | POA: Diagnosis not present

## 2023-07-06 DIAGNOSIS — F320 Major depressive disorder, single episode, mild: Secondary | ICD-10-CM | POA: Diagnosis not present

## 2023-07-13 DIAGNOSIS — F320 Major depressive disorder, single episode, mild: Secondary | ICD-10-CM | POA: Diagnosis not present

## 2023-07-21 ENCOUNTER — Other Ambulatory Visit (HOSPITAL_COMMUNITY): Payer: Self-pay

## 2023-07-23 DIAGNOSIS — F320 Major depressive disorder, single episode, mild: Secondary | ICD-10-CM | POA: Diagnosis not present

## 2023-08-03 DIAGNOSIS — F320 Major depressive disorder, single episode, mild: Secondary | ICD-10-CM | POA: Diagnosis not present

## 2023-08-04 ENCOUNTER — Encounter: Payer: Self-pay | Admitting: Family Medicine

## 2023-08-04 ENCOUNTER — Ambulatory Visit: Payer: 59 | Attending: Family Medicine | Admitting: Family Medicine

## 2023-08-04 VITALS — BP 133/78 | HR 63 | Temp 97.7°F | Ht 69.0 in | Wt 236.4 lb

## 2023-08-04 DIAGNOSIS — Z6834 Body mass index (BMI) 34.0-34.9, adult: Secondary | ICD-10-CM | POA: Diagnosis not present

## 2023-08-04 DIAGNOSIS — R7303 Prediabetes: Secondary | ICD-10-CM | POA: Diagnosis not present

## 2023-08-04 DIAGNOSIS — I10 Essential (primary) hypertension: Secondary | ICD-10-CM | POA: Diagnosis not present

## 2023-08-04 DIAGNOSIS — Z23 Encounter for immunization: Secondary | ICD-10-CM | POA: Diagnosis not present

## 2023-08-04 DIAGNOSIS — E6609 Other obesity due to excess calories: Secondary | ICD-10-CM | POA: Diagnosis not present

## 2023-08-04 DIAGNOSIS — Z1211 Encounter for screening for malignant neoplasm of colon: Secondary | ICD-10-CM

## 2023-08-04 NOTE — Patient Instructions (Signed)
Calorie Counting for Weight Loss Calories are units of energy. Your body needs a certain number of calories from food to keep going throughout the day. When you eat or drink more calories than your body needs, your body stores the extra calories mostly as fat. When you eat or drink fewer calories than your body needs, your body burns fat to get the energy it needs. Calorie counting means keeping track of how many calories you eat and drink each day. Calorie counting can be helpful if you need to lose weight. If you eat fewer calories than your body needs, you should lose weight. Ask your health care provider what a healthy weight is for you. For calorie counting to work, you will need to eat the right number of calories each day to lose a healthy amount of weight per week. A dietitian can help you figure out how many calories you need in a day and will suggest ways to reach your calorie goal. A healthy amount of weight to lose each week is usually 1-2 lb (0.5-0.9 kg). This usually means that your daily calorie intake should be reduced by 500-750 calories. Eating 1,200-1,500 calories a day can help most women lose weight. Eating 1,500-1,800 calories a day can help most men lose weight. What do I need to know about calorie counting? Work with your health care provider or dietitian to determine how many calories you should get each day. To meet your daily calorie goal, you will need to: Find out how many calories are in each food that you would like to eat. Try to do this before you eat. Decide how much of the food you plan to eat. Keep a food log. Do this by writing down what you ate and how many calories it had. To successfully lose weight, it is important to balance calorie counting with a healthy lifestyle that includes regular activity. Where do I find calorie information?  The number of calories in a food can be found on a Nutrition Facts label. If a food does not have a Nutrition Facts label, try  to look up the calories online or ask your dietitian for help. Remember that calories are listed per serving. If you choose to have more than one serving of a food, you will have to multiply the calories per serving by the number of servings you plan to eat. For example, the label on a package of bread might say that a serving size is 1 slice and that there are 90 calories in a serving. If you eat 1 slice, you will have eaten 90 calories. If you eat 2 slices, you will have eaten 180 calories. How do I keep a food log? After each time that you eat, record the following in your food log as soon as possible: What you ate. Be sure to include toppings, sauces, and other extras on the food. How much you ate. This can be measured in cups, ounces, or number of items. How many calories were in each food and drink. The total number of calories in the food you ate. Keep your food log near you, such as in a pocket-sized notebook or on an app or website on your mobile phone. Some programs will calculate calories for you and show you how many calories you have left to meet your daily goal. What are some portion-control tips? Know how many calories are in a serving. This will help you know how many servings you can have of a certain   food. Use a measuring cup to measure serving sizes. You could also try weighing out portions on a kitchen scale. With time, you will be able to estimate serving sizes for some foods. Take time to put servings of different foods on your favorite plates or in your favorite bowls and cups so you know what a serving looks like. Try not to eat straight from a food's packaging, such as from a bag or box. Eating straight from the package makes it hard to see how much you are eating and can lead to overeating. Put the amount you would like to eat in a cup or on a plate to make sure you are eating the right portion. Use smaller plates, glasses, and bowls for smaller portions and to prevent  overeating. Try not to multitask. For example, avoid watching TV or using your computer while eating. If it is time to eat, sit down at a table and enjoy your food. This will help you recognize when you are full. It will also help you be more mindful of what and how much you are eating. What are tips for following this plan? Reading food labels Check the calorie count compared with the serving size. The serving size may be smaller than what you are used to eating. Check the source of the calories. Try to choose foods that are high in protein, fiber, and vitamins, and low in saturated fat, trans fat, and sodium. Shopping Read nutrition labels while you shop. This will help you make healthy decisions about which foods to buy. Pay attention to nutrition labels for low-fat or fat-free foods. These foods sometimes have the same number of calories or more calories than the full-fat versions. They also often have added sugar, starch, or salt to make up for flavor that was removed with the fat. Make a grocery list of lower-calorie foods and stick to it. Cooking Try to cook your favorite foods in a healthier way. For example, try baking instead of frying. Use low-fat dairy products. Meal planning Use more fruits and vegetables. One-half of your plate should be fruits and vegetables. Include lean proteins, such as chicken, turkey, and fish. Lifestyle Each week, aim to do one of the following: 150 minutes of moderate exercise, such as walking. 75 minutes of vigorous exercise, such as running. General information Know how many calories are in the foods you eat most often. This will help you calculate calorie counts faster. Find a way of tracking calories that works for you. Get creative. Try different apps or programs if writing down calories does not work for you. What foods should I eat?  Eat nutritious foods. It is better to have a nutritious, high-calorie food, such as an avocado, than a food with  few nutrients, such as a bag of potato chips. Use your calories on foods and drinks that will fill you up and will not leave you hungry soon after eating. Examples of foods that fill you up are nuts and nut butters, vegetables, lean proteins, and high-fiber foods such as whole grains. High-fiber foods are foods with more than 5 g of fiber per serving. Pay attention to calories in drinks. Low-calorie drinks include water and unsweetened drinks. The items listed above may not be a complete list of foods and beverages you can eat. Contact a dietitian for more information. What foods should I limit? Limit foods or drinks that are not good sources of vitamins, minerals, or protein or that are high in unhealthy fats. These   include: Candy. Other sweets. Sodas, specialty coffee drinks, alcohol, and juice. The items listed above may not be a complete list of foods and beverages you should avoid. Contact a dietitian for more information. How do I count calories when eating out? Pay attention to portions. Often, portions are much larger when eating out. Try these tips to keep portions smaller: Consider sharing a meal instead of getting your own. If you get your own meal, eat only half of it. Before you start eating, ask for a container and put half of your meal into it. When available, consider ordering smaller portions from the menu instead of full portions. Pay attention to your food and drink choices. Knowing the way food is cooked and what is included with the meal can help you eat fewer calories. If calories are listed on the menu, choose the lower-calorie options. Choose dishes that include vegetables, fruits, whole grains, low-fat dairy products, and lean proteins. Choose items that are boiled, broiled, grilled, or steamed. Avoid items that are buttered, battered, fried, or served with cream sauce. Items labeled as crispy are usually fried, unless stated otherwise. Choose water, low-fat milk,  unsweetened iced tea, or other drinks without added sugar. If you want an alcoholic beverage, choose a lower-calorie option, such as a glass of wine or light beer. Ask for dressings, sauces, and syrups on the side. These are usually high in calories, so you should limit the amount you eat. If you want a salad, choose a garden salad and ask for grilled meats. Avoid extra toppings such as bacon, cheese, or fried items. Ask for the dressing on the side, or ask for olive oil and vinegar or lemon to use as dressing. Estimate how many servings of a food you are given. Knowing serving sizes will help you be aware of how much food you are eating at restaurants. Where to find more information Centers for Disease Control and Prevention: www.cdc.gov U.[place]. Department of Agriculture: myplate.gov Summary Calorie counting means keeping track of how many calories you eat and drink each day. If you eat fewer calories than your body needs, you should lose weight. A healthy amount of weight to lose per week is usually 1-2 lb (0.5-0.9 kg). This usually means reducing your daily calorie intake by 500-750 calories. The number of calories in a food can be found on a Nutrition Facts label. If a food does not have a Nutrition Facts label, try to look up the calories online or ask your dietitian for help. Use smaller plates, glasses, and bowls for smaller portions and to prevent overeating. Use your calories on foods and drinks that will fill you up and not leave you hungry shortly after a meal. This information is not intended to replace advice given to you by your health care provider. Make sure you discuss any questions you have with your health care provider. Document Revised: 12/22/2019 Document Reviewed: 12/22/2019 Elsevier Patient Education  2023 [employer]  

## 2023-08-04 NOTE — Progress Notes (Signed)
Subjective:  Patient ID: Walter [name], male    DOB: 1976-08-09  Age: 47 y.o. MRN: [id number]  CC: Medical Management of Chronic Issues (Discuss weight loss)   HPI Walter [name] is a 47 y.o. year old male with a history of HIV, hypertension, prediabetes.   Interval History: Discussed the use of AI scribe software for clinical note transcription with the patient, who gave verbal consent to proceed.  He presents with concerns about weight gain. He reports a recent period of emotional stress due to the loss of his grandmother, during which he overate and then stopped eating. When he resumed eating, he noticed weight gain. He reports daily walking, with a minimum of two miles per day. His diet is not optimal, with a recent increase in consumption of junk food as a coping mechanism. He does not consume pork, beef, or bread, but has a preference for high-carb foods like pasta and rice. He expresses a desire for assistance in losing weight and getting back on track with healthier eating habits.      His hypertension and prediabetes are diet controlled. He continues to follow-up with his infectious disease specialist with an upcoming appointment in 2 months.  Past Medical History:  Diagnosis Date   Acute esophagitis 10/08/2015   AIDS (HCC) 10/08/2015   COVID-19 virus infection 12/19/2019   Essential hypertension    Hyperglycemia 11/30/2017   Hyperlipidemia 12/14/2022   Prediabetes 02/2019   Screen for STD (sexually transmitted disease) 01/23/2016   Seasonal allergies    Weight gain 12/14/2018    No past surgical history on file.  Family History  Problem Relation Age of Onset   Hypertension Mother    Diabetes Father     Social History   Socioeconomic History   Marital status: Single    Spouse name: Not on file   Number of children: Not on file   Years of education: Not on file   Highest education level: Not on file  Occupational History   Not on file  Tobacco Use    Smoking status: Never   Smokeless tobacco: Never  Vaping Use   Vaping status: Never Used  Substance and Sexual Activity   Alcohol use: Yes    Comment: occ   Drug use: No   Sexual activity: Not Currently    Partners: Male    Comment: declined condoms  Other Topics Concern   Not on file  Social History Narrative   Not on file   Social Determinants of Health   Financial Resource Strain: Not on file  Food Insecurity: Not on file  Transportation Needs: Not on file  Physical Activity: Not on file  Stress: Not on file  Social Connections: Unknown (04/08/2022)   [facility]   Social Network    Social Network: Not on file    No Known Allergies  Outpatient Medications Prior to Visit  Medication Sig Dispense Refill   atorvastatin (LIPITOR) 40 MG tablet Take 1 tablet (40 mg total) by mouth daily. 30 tablet 11   bictegravir-emtricitabine-tenofovir AF (BIKTARVY) 50-200-25 MG TABS tablet Take 1 tablet by mouth daily. 30 tablet 11   amoxicillin-clavulanate (AUGMENTIN) 875-125 MG tablet Take 1 tablet by mouth 2 (two) times daily. (Patient not taking: Reported on 08/04/2023) 14 tablet 0   fluticasone (FLONASE) 50 MCG/ACT nasal spray Place 2 sprays into both nostrils daily. (Patient not taking: Reported on 08/04/2023) 16 g 0   predniSONE (DELTASONE) 20 MG tablet Take 1 tablet (  20 mg) by mouth daily with breakfast. (Patient not taking: Reported on 08/04/2023) 5 tablet 0   No facility-administered medications prior to visit.     ROS Review of Systems  Constitutional:  Negative for activity change and appetite change.  HENT:  Negative for sinus pressure and sore throat.   Respiratory:  Negative for chest tightness, shortness of breath and wheezing.   Cardiovascular:  Negative for chest pain and palpitations.  Gastrointestinal:  Negative for abdominal distention, abdominal pain and constipation.  Genitourinary: Negative.   Musculoskeletal: Negative.   Psychiatric/Behavioral:   Negative for behavioral problems and dysphoric mood.     Objective:  BP 133/78   Pulse 63   Temp 97.7 F (36.5 C) (Oral)   Ht 5\' 9"  (1.753 m)   Wt 236 lb 6.4 oz (107.2 kg)   SpO2 100%   BMI 34.91 kg/m      08/04/2023    8:53 AM 01/27/2023    9:04 AM 07/29/2022    9:19 AM  BP/Weight  Systolic BP 133 121 126  Diastolic BP 78 81 80  Wt. (Lbs) 236.4 241.8 239.6  BMI 34.91 kg/m2 35.71 kg/m2 35.38 kg/m2      Physical Exam Constitutional:      Appearance: He is well-developed.  Cardiovascular:     Rate and Rhythm: Normal rate.     Heart sounds: Normal heart sounds. No murmur heard. Pulmonary:     Effort: Pulmonary effort is normal.     Breath sounds: Normal breath sounds. No wheezing or rales.  Chest:     Chest wall: No tenderness.  Abdominal:     General: Bowel sounds are normal. There is no distension.     Palpations: Abdomen is soft. There is no mass.     Tenderness: There is no abdominal tenderness.  Musculoskeletal:        General: Normal range of motion.     Right lower leg: No edema.     Left lower leg: No edema.  Neurological:     Mental Status: He is alert and oriented to person, place, and time.  Psychiatric:        Mood and Affect: Mood normal.        Latest Ref Rng & Units 12/01/2022    8:47 AM 06/02/2022    9:05 AM 11/28/2021   10:01 AM  CMP  Glucose 65 - 99 mg/dL 782  956  213   BUN 7 - 25 mg/dL 10  11  10    Creatinine 0.60 - 1.29 mg/dL 0.86  5.78  4.69   Sodium 135 - 146 mmol/L 140  140  140   Potassium 3.5 - 5.3 mmol/L 4.1  4.3  4.1   Chloride 98 - 110 mmol/L 105  108  108   CO2 20 - 32 mmol/L 27  26  28    Calcium 8.6 - 10.3 mg/dL 9.1  9.0  9.1   Total Protein 6.1 - 8.1 g/dL 7.2  6.9  7.0   Total Bilirubin 0.2 - 1.2 mg/dL 0.8  0.3  0.4   AST 10 - 40 U/L 34  25  21   ALT 9 - 46 U/L 30  20  22      Lipid Panel     Component Value Date/Time   CHOL 121 12/01/2022 0847   CHOL 192 12/08/2018 1038   TRIG 47 12/01/2022 0847   HDL 57 12/01/2022  0847   HDL 41 12/08/2018 1038   CHOLHDL 2.1 12/01/2022  0847   VLDL 20 05/11/2017 1032   LDLCALC 51 12/01/2022 0847    CBC    Component Value Date/Time   WBC 3.9 12/01/2022 0847   WBC 4.0 12/01/2022 0847   RBC 4.72 12/01/2022 0847   RBC 4.80 12/01/2022 0847   HGB 15.5 12/01/2022 0847   HGB 15.5 12/01/2022 0847   HCT 44.7 12/01/2022 0847   HCT 47.8 12/01/2022 0847   PLT 237 12/01/2022 0847   PLT 261 12/01/2022 0847   MCV 94.7 12/01/2022 0847   MCV 100 (H) 12/01/2022 0847   MCH 32.8 12/01/2022 0847   MCH 32.3 12/01/2022 0847   MCHC 34.7 12/01/2022 0847   MCHC 32.4 12/01/2022 0847   RDW 12.2 12/01/2022 0847   RDW 12.6 12/01/2022 0847   LYMPHSABS 1,689 12/01/2022 0847   LYMPHSABS 1.8 12/01/2022 0847   MONOABS 336 05/11/2017 1032   EOSABS 82 12/01/2022 0847   EOSABS 0.1 12/01/2022 0847   BASOSABS 59 12/01/2022 0847   BASOSABS 0.1 12/01/2022 0847    Lab Results  Component Value Date   HGBA1C 5.5 01/27/2023    Assessment & Plan:      Class 1 Obesity Patient expressed desire for assistance with weight loss. Currently engaging in regular exercise (walking 2 miles daily) but has had recent dietary challenges due to personal loss. High carbohydrate intake noted, particularly pasta and rice.  -Encouraged caloric restriction (cutting 500 calories per day) and healthier food choices. -Recommended use of meal tracking apps like My Fitness Pal. -Referral to weight management clinic for further support and evaluation for weight loss medications.  Prediabetes  Labs reveal prediabetes with an A1c of 5.5.  Working on a low carbohydrate diet, exercise, weight loss is recommended in order to prevent progression to type 2 diabetes mellitus.  HIV Management Patient is compliant with antiretroviral therapy (Biktarvy) and reports no neuropathy. -Continue Biktarvy as prescribed. -Order labs to monitor disease status.   Colon Cancer Screening Patient due for colonoscopy but has  previously missed appointments. -Place referral for colonoscopy.  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 -Administer flu shot today.  Follow-up in 6 months with weight loss progress.          No orders of the defined types were placed in this encounter.   Follow-up: Return in about 6 months (around 02/01/2024) for Chronic medical conditions.       Hoy Register, MD, FAAFP. [facility] [place], [place] [phone]   08/04/2023, 12:39 PM

## 2023-08-05 LAB — CMP14+EGFR
ALT: 34 IU/L (ref 0–44)
AST: 36 IU/L (ref 0–40)
Albumin: 4.4 g/dL (ref 4.1–5.1)
Alkaline Phosphatase: 90 IU/L (ref 44–121)
BUN/Creatinine Ratio: 8 — ABNORMAL LOW (ref 9–20)
BUN: 10 mg/dL (ref 6–24)
Bilirubin Total: 0.5 mg/dL (ref 0.0–1.2)
CO2: 23 mmol/L (ref 20–29)
Calcium: 9.2 mg/dL (ref 8.7–10.2)
Chloride: 103 mmol/L (ref 96–106)
Creatinine, Ser: 1.23 mg/dL (ref 0.76–1.27)
Globulin, Total: 2.6 g/dL (ref 1.5–4.5)
Glucose: 103 mg/dL — ABNORMAL HIGH (ref 70–99)
Potassium: 4.5 mmol/L (ref 3.5–5.2)
Sodium: 139 mmol/L (ref 134–144)
Total Protein: 7 g/dL (ref 6.0–8.5)
eGFR: 73 mL/min/{1.73_m2} (ref >59)

## 2023-08-05 LAB — CBC WITH DIFFERENTIAL/PLATELET
Basophils Absolute: 0.1 10*3/uL (ref 0.0–0.2)
Basos: 2 %
EOS (ABSOLUTE): 0 10*3/uL (ref 0.0–0.4)
Eos: 1 %
Hematocrit: 47 % (ref 37.5–51.0)
Hemoglobin: 15.7 g/dL (ref 13.0–17.7)
Immature Grans (Abs): 0 10*3/uL (ref 0.0–0.1)
Immature Granulocytes: 0 %
Lymphocytes Absolute: 1.3 10*3/uL (ref 0.7–3.1)
Lymphs: 48 %
MCH: 32.4 pg (ref 26.6–33.0)
MCHC: 33.4 g/dL (ref 31.5–35.7)
MCV: 97 fL (ref 79–97)
Monocytes Absolute: 0.3 10*3/uL (ref 0.1–0.9)
Monocytes: 11 %
Neutrophils Absolute: 1.1 10*3/uL — ABNORMAL LOW (ref 1.4–7.0)
Neutrophils: 38 %
Platelets: 239 10*3/uL (ref 150–450)
RBC: 4.84 x10E6/uL (ref 4.14–5.80)
RDW: 13 % (ref 11.6–15.4)
WBC: 2.8 10*3/uL — ABNORMAL LOW (ref 3.4–10.8)

## 2023-08-05 LAB — LP+NON-HDL CHOLESTEROL
Cholesterol, Total: 120 mg/dL (ref 100–199)
HDL: 51 mg/dL (ref >39)
LDL Chol Calc (NIH): 57 mg/dL (ref 0–99)
Total Non-HDL-Chol (LDL+VLDL): 69 mg/dL (ref 0–129)
Triglycerides: 51 mg/dL (ref 0–149)
VLDL Cholesterol Cal: 12 mg/dL (ref 5–40)

## 2023-08-05 LAB — HEMOGLOBIN A1C
Est. average glucose Bld gHb Est-mCnc: 114 mg/dL
Hgb A1c MFr Bld: 5.6 % (ref 4.8–5.6)

## 2023-08-10 DIAGNOSIS — F320 Major depressive disorder, single episode, mild: Secondary | ICD-10-CM | POA: Diagnosis not present

## 2023-08-13 ENCOUNTER — Other Ambulatory Visit (HOSPITAL_COMMUNITY): Payer: Self-pay

## 2023-08-13 ENCOUNTER — Other Ambulatory Visit: Payer: Self-pay

## 2023-09-08 ENCOUNTER — Other Ambulatory Visit (HOSPITAL_COMMUNITY): Payer: Self-pay | Admitting: Pharmacy Technician

## 2023-09-08 ENCOUNTER — Other Ambulatory Visit (HOSPITAL_COMMUNITY): Payer: Self-pay

## 2023-09-08 ENCOUNTER — Other Ambulatory Visit: Payer: Self-pay

## 2023-09-08 NOTE — Progress Notes (Signed)
Specialty Pharmacy Refill Coordination Note  Walter [name] is a 47 y.o. male contacted today regarding refills of specialty medication(s) Bictegravir-Emtricitab-Tenofov   Patient requested Delivery   Delivery date: 09/14/23   Verified address: [address] [name]   Medication will be filled on 09/11/23.

## 2023-09-11 ENCOUNTER — Other Ambulatory Visit: Payer: Self-pay

## 2023-09-14 ENCOUNTER — Other Ambulatory Visit: Payer: Self-pay

## 2023-09-28 ENCOUNTER — Other Ambulatory Visit: Payer: 59

## 2023-09-28 ENCOUNTER — Other Ambulatory Visit (HOSPITAL_COMMUNITY)
Admission: RE | Admit: 2023-09-28 | Discharge: 2023-09-28 | Disposition: A | Discharge status: Home / Self Care | Payer: 59 | Source: Ambulatory Visit | Attending: Infectious Disease | Admitting: Infectious Disease

## 2023-09-28 ENCOUNTER — Ambulatory Visit (INDEPENDENT_AMBULATORY_CARE_PROVIDER_SITE_OTHER): Payer: 59

## 2023-09-28 ENCOUNTER — Other Ambulatory Visit: Payer: Self-pay

## 2023-09-28 DIAGNOSIS — Z6839 Body mass index (BMI) 39.0-39.9, adult: Secondary | ICD-10-CM | POA: Insufficient documentation

## 2023-09-28 DIAGNOSIS — Z23 Encounter for immunization: Secondary | ICD-10-CM | POA: Diagnosis not present

## 2023-09-28 DIAGNOSIS — E66812 Obesity, class 2: Secondary | ICD-10-CM | POA: Insufficient documentation

## 2023-09-28 DIAGNOSIS — B20 Human immunodeficiency virus [HIV] disease: Secondary | ICD-10-CM

## 2023-09-28 DIAGNOSIS — E785 Hyperlipidemia, unspecified: Secondary | ICD-10-CM

## 2023-09-28 DIAGNOSIS — Z113 Encounter for screening for infections with a predominantly sexual mode of transmission: Secondary | ICD-10-CM | POA: Diagnosis not present

## 2023-09-28 NOTE — Progress Notes (Signed)
Nurse Visit  BOBBIE VALLETTA 01/15/1976   No Known Allergies   Reviewed allergies with patient.   Medications administered: none  Immunizations administered: COVID-19 [employer])  Patient tolerated well.   Sandie Ano, RN

## 2023-09-29 LAB — T-HELPER CELL (CD4) - (RCID CLINIC ONLY)
CD4 % Helper T Cell: 26 % — ABNORMAL LOW (ref 33–65)
CD4 T Cell Abs: 389 /uL — ABNORMAL LOW (ref 400–1790)

## 2023-09-29 LAB — URINE CYTOLOGY ANCILLARY ONLY
Chlamydia: NEGATIVE
Comment: NEGATIVE
Comment: NORMAL
Neisseria Gonorrhea: NEGATIVE

## 2023-09-30 LAB — COMPLETE METABOLIC PANEL WITH GFR
AG Ratio: 1.5 (calc) (ref 1.0–2.5)
ALT: 38 U/L (ref 9–46)
AST: 36 U/L (ref 10–40)
Albumin: 4.4 g/dL (ref 3.6–5.1)
Alkaline phosphatase (APISO): 79 U/L (ref 36–130)
BUN: 12 mg/dL (ref 7–25)
CO2: 27 mmol/L (ref 20–32)
Calcium: 9.4 mg/dL (ref 8.6–10.3)
Chloride: 106 mmol/L (ref 98–110)
Creat: 1.13 mg/dL (ref 0.60–1.29)
Globulin: 2.9 g/dL (ref 1.9–3.7)
Glucose, Bld: 112 mg/dL — ABNORMAL HIGH (ref 65–99)
Potassium: 4 mmol/L (ref 3.5–5.3)
Sodium: 141 mmol/L (ref 135–146)
Total Bilirubin: 0.7 mg/dL (ref 0.2–1.2)
Total Protein: 7.3 g/dL (ref 6.1–8.1)
eGFR: 81 mL/min/{1.73_m2} (ref >=60)

## 2023-09-30 LAB — CBC WITH DIFFERENTIAL/PLATELET
Absolute Lymphocytes: 1632 {cells}/uL (ref 850–3900)
Absolute Monocytes: 432 {cells}/uL (ref 200–950)
Basophils Absolute: 52 {cells}/uL (ref 0–200)
Basophils Relative: 1.3 %
Eosinophils Absolute: 52 {cells}/uL (ref 15–500)
Eosinophils Relative: 1.3 %
HCT: 47.5 % (ref 38.5–50.0)
Hemoglobin: 15.9 g/dL (ref 13.2–17.1)
MCH: 33 pg (ref 27.0–33.0)
MCHC: 33.5 g/dL (ref 32.0–36.0)
MCV: 98.5 fL (ref 80.0–100.0)
MPV: 9.4 fL (ref 7.5–12.5)
Monocytes Relative: 10.8 %
Neutro Abs: 1832 {cells}/uL (ref 1500–7800)
Neutrophils Relative %: 45.8 %
Platelets: 244 10*3/uL (ref 140–400)
RBC: 4.82 10*6/uL (ref 4.20–5.80)
RDW: 12.7 % (ref 11.0–15.0)
Total Lymphocyte: 40.8 %
WBC: 4 10*3/uL (ref 3.8–10.8)

## 2023-09-30 LAB — LIPID PANEL
Cholesterol: 136 mg/dL (ref <200)
HDL: 62 mg/dL (ref >=40)
LDL Cholesterol (Calc): 61 mg/dL
Non-HDL Cholesterol (Calc): 74 mg/dL (ref <130)
Total CHOL/HDL Ratio: 2.2 (calc) (ref <5.0)
Triglycerides: 58 mg/dL (ref <150)

## 2023-09-30 LAB — HIV-1 RNA QUANT-NO REFLEX-BLD
HIV 1 RNA Quant: NOT DETECTED {copies}/mL
HIV-1 RNA Quant, Log: NOT DETECTED {Log_copies}/mL

## 2023-09-30 LAB — RPR: RPR Ser Ql: NONREACTIVE

## 2023-09-30 LAB — HEPATITIS C ANTIBODY: Hepatitis C Ab: NONREACTIVE

## 2023-10-01 ENCOUNTER — Other Ambulatory Visit (HOSPITAL_COMMUNITY): Payer: Self-pay | Admitting: Pharmacy Technician

## 2023-10-01 ENCOUNTER — Other Ambulatory Visit (HOSPITAL_COMMUNITY): Payer: Self-pay

## 2023-10-01 NOTE — Progress Notes (Signed)
Specialty Pharmacy Refill Coordination Note  Walter [name] is a 46 y.o. male contacted today regarding refills of specialty medication(s) Bictegravir-Emtricitab-Tenofov   Patient requested Delivery   Delivery date: 10/09/23   Verified address: [address]   [place]   Medication will be filled on 10/08/23.

## 2023-10-06 DIAGNOSIS — F320 Major depressive disorder, single episode, mild: Secondary | ICD-10-CM | POA: Diagnosis not present

## 2023-10-08 ENCOUNTER — Encounter: Payer: Self-pay | Admitting: Infectious Disease

## 2023-10-08 ENCOUNTER — Other Ambulatory Visit: Payer: Self-pay

## 2023-10-08 DIAGNOSIS — Z7185 Encounter for immunization safety counseling: Secondary | ICD-10-CM | POA: Insufficient documentation

## 2023-10-08 HISTORY — DX: Encounter for immunization safety counseling: Z71.85

## 2023-10-12 ENCOUNTER — Telehealth (INDEPENDENT_AMBULATORY_CARE_PROVIDER_SITE_OTHER): Payer: 59 | Admitting: Infectious Disease

## 2023-10-12 ENCOUNTER — Other Ambulatory Visit (HOSPITAL_COMMUNITY): Payer: Self-pay

## 2023-10-12 ENCOUNTER — Other Ambulatory Visit: Payer: Self-pay

## 2023-10-12 DIAGNOSIS — E785 Hyperlipidemia, unspecified: Secondary | ICD-10-CM

## 2023-10-12 DIAGNOSIS — F331 Major depressive disorder, recurrent, moderate: Secondary | ICD-10-CM

## 2023-10-12 DIAGNOSIS — I10 Essential (primary) hypertension: Secondary | ICD-10-CM

## 2023-10-12 DIAGNOSIS — Z7185 Encounter for immunization safety counseling: Secondary | ICD-10-CM

## 2023-10-12 DIAGNOSIS — F32A Depression, unspecified: Secondary | ICD-10-CM

## 2023-10-12 DIAGNOSIS — B20 Human immunodeficiency virus [HIV] disease: Secondary | ICD-10-CM

## 2023-10-12 DIAGNOSIS — F4321 Adjustment disorder with depressed mood: Secondary | ICD-10-CM

## 2023-10-12 MED ORDER — ATORVASTATIN CALCIUM 40 MG PO TABS
40.0000 mg | ORAL_TABLET | Freq: Every day | ORAL | 11 refills | Status: DC
  Filled 2023-10-12 – 2023-11-08 (×2): qty 30, 30d supply, fill #0
  Filled 2023-12-08: qty 30, 30d supply, fill #1
  Filled 2024-01-04: qty 30, 30d supply, fill #2
  Filled 2024-02-02: qty 30, 30d supply, fill #3
  Filled 2024-03-03: qty 30, 30d supply, fill #4
  Filled 2024-03-30: qty 30, 30d supply, fill #5
  Filled 2024-04-28: qty 30, 30d supply, fill #6
  Filled 2024-05-29: qty 30, 30d supply, fill #7
  Filled 2024-07-02: qty 30, 30d supply, fill #8
  Filled 2024-08-01: qty 30, 30d supply, fill #9
  Filled 2024-08-30: qty 30, 30d supply, fill #10
  Filled 2024-09-29: qty 30, 30d supply, fill #11

## 2023-10-12 MED ORDER — BIKTARVY 50-200-25 MG PO TABS
1.0000 | ORAL_TABLET | Freq: Every day | ORAL | 11 refills | Status: DC
  Filled 2023-10-12 – 2023-10-28 (×2): qty 30, 30d supply, fill #0
  Filled 2023-11-19: qty 30, 30d supply, fill #1
  Filled 2023-12-29: qty 30, 30d supply, fill #2
  Filled 2024-01-26: qty 30, 30d supply, fill #3
  Filled 2024-02-18: qty 30, 30d supply, fill #4
  Filled 2024-03-14 (×2): qty 30, 30d supply, fill #5
  Filled 2024-04-12: qty 30, 30d supply, fill #6
  Filled 2024-05-04: qty 30, 30d supply, fill #7
  Filled 2024-06-08: qty 30, 30d supply, fill #8
  Filled 2024-07-01 – 2024-07-04 (×2): qty 30, 30d supply, fill #9
  Filled 2024-07-28 – 2024-08-01 (×2): qty 30, 30d supply, fill #10
  Filled 2024-08-24: qty 30, 30d supply, fill #11

## 2023-10-12 NOTE — Progress Notes (Signed)
Virtual Visit via Video Note  I connected with Walter [name] on 10/12/23 at  9:30 AM EST by a video enabled telemedicine application and verified that I am speaking with the correct person using two identifiers.  Location: Patient: Home Provider: [facility]   I discussed the limitations of evaluation and management by telemedicine and the availability of in person appointments. The patient expressed understanding and agreed to proceed.  History of Present Illness:  This is a 47 year old black man with HIV that has been perfectly controlled on Biktarvy, bulimia on Lipitor history of obesity but with successful weight loss.  He is doing well other than some depression that flared up when his 55 year old grandmother passed away recently.  They are quite close and his depression worsened although now his on the path to improvement and he is continuing it with counseling he does not want antidepressant patients.     Observations/Objective:  Walter [name] appears in no acute distress and seemed comfortable when seen through video feed   Assessment and Plan:   HIV disease:  I have reviewed Walter [name]'s labs including viral load which was  Lab Results  Component Value Date   HIV1RNAQUANT Not Detected 09/28/2023   and cd4 which was  Lab Results  Component Value Date   CD4TABS 389 (L) 09/28/2023     I am continuing patient's prescription for Biktarvy  Lipidemia:  Reviewed lipid panel will continue his Lipitor Lipid Panel     Component Value Date/Time   CHOL 136 09/28/2023 0830   CHOL 120 08/04/2023 1014   TRIG 58 09/28/2023 0830   HDL 62 09/28/2023 0830   HDL 51 08/04/2023 1014   CHOLHDL 2.2 09/28/2023 0830   VLDL 20 05/11/2017 1032   LDLCALC 61 09/28/2023 0830   LABVLDL 12 08/04/2023 1014    Vaccine counseling: Recommended Prevnar 20 and tetanus shots which she will come back to receive he received his COVID and flu shots this fall.  Anal cancer prevention he is  agreeable to anal Pap smear at next visit.  Depression, grieving continue with counseling   Follow Up Instructions:    I discussed the assessment and treatment plan with the patient. The patient was provided an opportunity to ask questions and all were answered. The patient agreed with the plan and demonstrated an understanding of the instructions.   The patient was advised to call back or seek an in-person evaluation if the symptoms worsen or if the condition fails to improve as anticipated.  I provided 30 minutes of non-face-to-face time during this encounter.   Acey Lav, MD

## 2023-10-19 ENCOUNTER — Telehealth: Payer: 59 | Admitting: Physician Assistant

## 2023-10-19 ENCOUNTER — Other Ambulatory Visit (HOSPITAL_COMMUNITY): Payer: Self-pay

## 2023-10-19 DIAGNOSIS — J069 Acute upper respiratory infection, unspecified: Secondary | ICD-10-CM | POA: Diagnosis not present

## 2023-10-19 DIAGNOSIS — B9689 Other specified bacterial agents as the cause of diseases classified elsewhere: Secondary | ICD-10-CM

## 2023-10-19 MED ORDER — AMOXICILLIN-POT CLAVULANATE 875-125 MG PO TABS
1.0000 | ORAL_TABLET | Freq: Two times a day (BID) | ORAL | 0 refills | Status: DC
  Filled 2023-10-19: qty 20, 10d supply, fill #0

## 2023-10-19 MED ORDER — PSEUDOEPH-BROMPHEN-DM 30-2-10 MG/5ML PO SYRP
5.0000 mL | ORAL_SOLUTION | Freq: Four times a day (QID) | ORAL | 0 refills | Status: DC | PRN
  Filled 2023-10-19: qty 120, 6d supply, fill #0

## 2023-10-19 NOTE — Progress Notes (Signed)

## 2023-10-28 ENCOUNTER — Encounter (INDEPENDENT_AMBULATORY_CARE_PROVIDER_SITE_OTHER): Payer: Self-pay | Admitting: Physician Assistant

## 2023-10-28 ENCOUNTER — Other Ambulatory Visit: Payer: Self-pay

## 2023-10-28 ENCOUNTER — Ambulatory Visit (INDEPENDENT_AMBULATORY_CARE_PROVIDER_SITE_OTHER): Payer: 59 | Admitting: Physician Assistant

## 2023-10-28 VITALS — BP 132/87 | HR 78 | Temp 98.7°F | Ht 69.0 in | Wt 237.0 lb

## 2023-10-28 DIAGNOSIS — R7303 Prediabetes: Secondary | ICD-10-CM

## 2023-10-28 DIAGNOSIS — Z6831 Body mass index (BMI) 31.0-31.9, adult: Secondary | ICD-10-CM | POA: Insufficient documentation

## 2023-10-28 DIAGNOSIS — R739 Hyperglycemia, unspecified: Secondary | ICD-10-CM

## 2023-10-28 DIAGNOSIS — I10 Essential (primary) hypertension: Secondary | ICD-10-CM | POA: Diagnosis not present

## 2023-10-28 DIAGNOSIS — B20 Human immunodeficiency virus [HIV] disease: Secondary | ICD-10-CM

## 2023-10-28 DIAGNOSIS — E66811 Obesity, class 1: Secondary | ICD-10-CM | POA: Insufficient documentation

## 2023-10-28 DIAGNOSIS — E6609 Other obesity due to excess calories: Secondary | ICD-10-CM | POA: Insufficient documentation

## 2023-10-28 DIAGNOSIS — Z6834 Body mass index (BMI) 34.0-34.9, adult: Secondary | ICD-10-CM

## 2023-10-28 NOTE — Progress Notes (Signed)
Specialty Pharmacy Refill Coordination Note  Walter [name] is a 47 y.o. male contacted today regarding refills of specialty medication(s) Bictegravir-Emtricitab-Tenofov   Patient requested Delivery   Delivery date: 11/03/23   Verified address: [address]  [place]   Medication will be filled on 11/02/23.

## 2023-10-28 NOTE — Progress Notes (Signed)
Office: [phone]  /  Fax: [phone]   Initial Visit  Walter [name] was seen in clinic today to evaluate for obesity. He is interested in losing weight to improve overall health and reduce the risk of weight related complications. He presents today to review program treatment options, initial physical assessment, and evaluation.     He was referred by: PCP  When asked what else they would like to accomplish? He states: Adopt healthier eating patterns, Improve existing medical conditions, Improve quality of life, Improve appearance, and Lose a target amount of weight : 210 lb lbs in 6 months.  Weight history: Gained weight as adult+ Gained weight with medications. Has changed nutrition and started walking and has lost weight recently but stalled with weight loss and would like to try medications to help loose another 20+ lbs. .   When asked how has your weight affected you? He states: Has affected self-esteem, Problems with eating patterns, and Has affected mood   Some associated conditions: Hyperlipidemia  Contributing factors: Family history of obesity, Disruption of circadian rhythm / sleep disordered breathing, Use of obesogenic medications: Other: Biktarvy, Moderate to high levels of stress, Eating patterns, Mental health problems, and Enticing relationships and enviroment  Weight promoting medications identified: Other: Biktarvy  Current nutrition plan: Portion control / smart choices  Current level of physical activity: Walking and Running / Jogging  Current or previous pharmacotherapy: None  Response to medication: Never tried medications   Past medical history includes:   Past Medical History:  Diagnosis Date   Acute esophagitis 10/08/2015   AIDS (HCC) 10/08/2015   COVID-19 virus infection 12/19/2019   Essential hypertension    Hyperglycemia 11/30/2017   Hyperlipidemia 12/14/2022   Prediabetes 02/2019   Screen for STD (sexually transmitted disease)  01/23/2016   Seasonal allergies    Vaccine counseling 10/08/2023   Weight gain 12/14/2018     Objective:   BP 132/87   Pulse 78   Temp 98.7 F (37.1 C)   Ht 5\' 9"  (1.753 m)   Wt 237 lb (107.5 kg)   SpO2 97%   BMI 35.00 kg/m  He was weighed on the bioimpedance scale: Body mass index is 35 kg/m.  Peak Weight:280 lbs , Body Fat%:30.3%, Visceral Fat Rating:16, Weight trend over the last 12 months: Decreasing  General:  Alert, oriented and cooperative. Patient is in no acute distress.  Respiratory: Normal respiratory effort, no problems with respiration noted   Gait: able to ambulate independently  Mental Status: Normal mood and affect. Normal behavior. Normal judgment and thought content.   DIAGNOSTIC DATA REVIEWED:  BMET    Component Value Date/Time   NA 141 09/28/2023 0830   NA 139 08/04/2023 1014   K 4.0 09/28/2023 0830   CL 106 09/28/2023 0830   CO2 27 09/28/2023 0830   GLUCOSE 112 (H) 09/28/2023 0830   BUN 12 09/28/2023 0830   BUN 10 08/04/2023 1014   CREATININE 1.13 09/28/2023 0830   CALCIUM 9.4 09/28/2023 0830   GFRNONAA 73 05/29/2021 0930   GFRAA 85 05/29/2021 0930   Lab Results  Component Value Date   HGBA1C 5.6 08/04/2023   HGBA1C 5.8 (H) 11/30/2017   No results found for: "INSULIN" CBC    Component Value Date/Time   WBC 4.0 09/28/2023 0830   RBC 4.82 09/28/2023 0830   HGB 15.9 09/28/2023 0830   HGB 15.7 08/04/2023 1014   HCT 47.5 09/28/2023 0830   HCT 47.0 08/04/2023 1014   PLT 244  09/28/2023 0830   PLT 239 08/04/2023 1014   MCV 98.5 09/28/2023 0830   MCV 97 08/04/2023 1014   MCH 33.0 09/28/2023 0830   MCHC 33.5 09/28/2023 0830   RDW 12.7 09/28/2023 0830   RDW 13.0 08/04/2023 1014   Iron/TIBC/Ferritin/ %Sat No results found for: "IRON", "TIBC", "FERRITIN", "IRONPCTSAT" Lipid Panel     Component Value Date/Time   CHOL 136 09/28/2023 0830   CHOL 120 08/04/2023 1014   TRIG 58 09/28/2023 0830   HDL 62 09/28/2023 0830   HDL 51 08/04/2023  1014   CHOLHDL 2.2 09/28/2023 0830   VLDL 20 05/11/2017 1032   LDLCALC 61 09/28/2023 0830   Hepatic Function Panel     Component Value Date/Time   PROT 7.3 09/28/2023 0830   PROT 7.0 08/04/2023 1014   ALBUMIN 4.4 08/04/2023 1014   AST 36 09/28/2023 0830   ALT 38 09/28/2023 0830   ALKPHOS 90 08/04/2023 1014   BILITOT 0.7 09/28/2023 0830   BILITOT 0.5 08/04/2023 1014      Component Value Date/Time   TSH 1.370 12/08/2018 1038     Assessment and Plan:   Primary hypertension  Hyperglycemia  Prediabetes  HIV disease (HCC)  Class 1 obesity due to excess calories with serious comorbidity and body mass index (BMI) of 34.0 to 34.9 in adult        Obesity Treatment / Action Plan:  Patient will work on garnering support from family and friends to begin weight loss journey. Will work on eliminating or reducing the presence of highly palatable, calorie dense foods in the home. Will complete provided nutritional and psychosocial assessment questionnaire before the next appointment. Will be scheduled for indirect calorimetry to determine resting energy expenditure in a fasting state.  This will allow [place] to create a reduced calorie, high-protein meal plan to promote loss of fat mass while preserving muscle mass. Will avoid skipping meals which may result in increased hunger signals and overeating at certain times. Counseled on the health benefits of losing 5%-15% of total body weight. Was counseled on nutritional approaches to weight loss and benefits of reducing processed foods and consuming plant-based foods and high quality protein as part of nutritional weight management. Was counseled on pharmacotherapy and role as an adjunct in weight management.   Obesity Education Performed Today:  He was weighed on the bioimpedance scale and results were discussed and documented in the synopsis.  We discussed obesity as a disease and the importance of a more detailed evaluation of all  the factors contributing to the disease.  We discussed the importance of long term lifestyle changes which include nutrition, exercise and behavioral modifications as well as the importance of customizing this to his specific health and social needs.  We discussed the benefits of reaching a healthier weight to alleviate the symptoms of existing conditions and reduce the risks of the biomechanical, metabolic and psychological effects of obesity.  [name] appears to be in the action stage of change and states they are ready to start intensive lifestyle modifications and behavioral modifications.  30 minutes was spent today on this visit including the above counseling, pre-visit chart review, and post-visit documentation.  Reviewed by clinician on day of visit: allergies, medications, problem list, medical history, surgical history, family history, social history, and previous encounter notes pertinent to obesity diagnosis.   Jullianna Gabor,PA-C

## 2023-10-28 NOTE — Progress Notes (Signed)
Specialty Pharmacy Ongoing Clinical Assessment Note  Walter [name] is a 47 y.o. male who is being followed by the specialty pharmacy service for RxSp HIV   Patient's specialty medication(s) reviewed today: Bictegravir-Emtricitab-Tenofov   Missed doses in the last 4 weeks: 0   Patient/Caregiver did not have any additional questions or concerns.   Therapeutic benefit summary: Patient is achieving benefit   Adverse events/side effects summary: No adverse events/side effects   Patient's therapy is appropriate to: Continue    Goals Addressed             This Visit's Progress    Achieve Undetectable HIV Viral Load < 20       Patient is on track. Patient will maintain adherence. Viral load remains undetectable long term.          Follow up:  6 months  Otto Herb Specialty Pharmacist

## 2023-10-30 ENCOUNTER — Other Ambulatory Visit (HOSPITAL_COMMUNITY): Payer: Self-pay

## 2023-10-30 DIAGNOSIS — H6502 Acute serous otitis media, left ear: Secondary | ICD-10-CM | POA: Diagnosis not present

## 2023-10-30 DIAGNOSIS — H9202 Otalgia, left ear: Secondary | ICD-10-CM | POA: Diagnosis not present

## 2023-10-30 MED ORDER — AZELASTINE HCL 0.15 % NA SOLN
NASAL | 0 refills | Status: DC
  Filled 2023-10-30: qty 30, 28d supply, fill #0

## 2023-10-30 MED ORDER — PREDNISONE 20 MG PO TABS
ORAL_TABLET | ORAL | 0 refills | Status: DC
  Filled 2023-10-30: qty 10, 5d supply, fill #0

## 2023-11-02 ENCOUNTER — Other Ambulatory Visit: Payer: Self-pay

## 2023-11-02 DIAGNOSIS — Z0289 Encounter for other administrative examinations: Secondary | ICD-10-CM

## 2023-11-09 ENCOUNTER — Other Ambulatory Visit: Payer: Self-pay

## 2023-11-19 ENCOUNTER — Other Ambulatory Visit: Payer: Self-pay

## 2023-11-19 ENCOUNTER — Other Ambulatory Visit (HOSPITAL_COMMUNITY): Payer: Self-pay

## 2023-11-19 NOTE — Progress Notes (Signed)
Specialty Pharmacy Refill Coordination Note  Walter [name] is a 47 y.o. male contacted today regarding refills of specialty medication(s) Bictegravir-Emtricitab-Tenofov Susanne Borders)   Patient requested Delivery   Delivery date: 12/02/23   Verified address: [address]   Medication will be filled on 12/03/23.

## 2023-12-01 ENCOUNTER — Other Ambulatory Visit: Payer: Self-pay

## 2023-12-09 ENCOUNTER — Other Ambulatory Visit: Payer: Self-pay

## 2023-12-09 ENCOUNTER — Ambulatory Visit (INDEPENDENT_AMBULATORY_CARE_PROVIDER_SITE_OTHER): Payer: 59 | Admitting: Internal Medicine

## 2023-12-09 ENCOUNTER — Other Ambulatory Visit (HOSPITAL_BASED_OUTPATIENT_CLINIC_OR_DEPARTMENT_OTHER): Payer: Self-pay

## 2023-12-09 ENCOUNTER — Encounter (INDEPENDENT_AMBULATORY_CARE_PROVIDER_SITE_OTHER): Payer: Self-pay | Admitting: Internal Medicine

## 2023-12-09 ENCOUNTER — Encounter: Payer: Self-pay | Admitting: Pharmacist

## 2023-12-09 VITALS — BP 126/82 | HR 60 | Temp 97.8°F | Ht 69.0 in | Wt 242.0 lb

## 2023-12-09 DIAGNOSIS — I10 Essential (primary) hypertension: Secondary | ICD-10-CM

## 2023-12-09 DIAGNOSIS — R0602 Shortness of breath: Secondary | ICD-10-CM

## 2023-12-09 DIAGNOSIS — B20 Human immunodeficiency virus [HIV] disease: Secondary | ICD-10-CM

## 2023-12-09 DIAGNOSIS — Z6835 Body mass index (BMI) 35.0-35.9, adult: Secondary | ICD-10-CM

## 2023-12-09 DIAGNOSIS — Z1331 Encounter for screening for depression: Secondary | ICD-10-CM | POA: Diagnosis not present

## 2023-12-09 DIAGNOSIS — R5383 Other fatigue: Secondary | ICD-10-CM | POA: Diagnosis not present

## 2023-12-09 DIAGNOSIS — E66812 Obesity, class 2: Secondary | ICD-10-CM

## 2023-12-09 DIAGNOSIS — E7800 Pure hypercholesterolemia, unspecified: Secondary | ICD-10-CM

## 2023-12-09 DIAGNOSIS — R7303 Prediabetes: Secondary | ICD-10-CM | POA: Diagnosis not present

## 2023-12-09 NOTE — Assessment & Plan Note (Addendum)
 LDL is at goal. Elevated LDL may be secondary to nutrition, genetics and spillover effect from excess adiposity. Recommended LDL goal is <70 to reduce the risk of fatty streaks and the progression to obstructive ASCVD in the future.   His 10 year risk is: The 10-year ASCVD risk score (Arnett DK, et al., 2019) is: 3.5%  Lab Results  Component Value Date   CHOL 136 09/28/2023   HDL 62 09/28/2023   LDLCALC 61 09/28/2023   TRIG 58 09/28/2023   CHOLHDL 2.2 09/28/2023   He is currently on atorvastatin  40 mg a day.  He will continue medication.  Patient counseled on risk of ASCVD associated with chronic HIV infection.  Continue weight loss therapy, losing 10% or more of body weight may improve condition. Also advised to reduce saturated fats in diet to less than 10% of daily calories.

## 2023-12-09 NOTE — Assessment & Plan Note (Signed)
 See obesity treatment plan.  Patient is on Biktarvy  which may contribute to weight gain but he is doing well on the medication.  He is also has some metabolic derangements associated with HAART.

## 2023-12-09 NOTE — Assessment & Plan Note (Addendum)
 Most recent HIVVL - UD and CD4 380's. On HAART , good compliance. HAART contributing to weight gain and metabolic derangements.  Patient will begin with nutritional and behavioral strategies for weight management.

## 2023-12-09 NOTE — Progress Notes (Signed)
 Office: [phone]  /  Fax: [phone]   Subjective   Initial Visit  Walter Walter [name] (MR# [id number]) Walter [name] an 48 y.o. male who presents for evaluation and treatment of obesity and related comorbidities. Current BMI Walter [name] Body mass index Walter [name] 35.74 kg/m. Walter Walter [name] been struggling with his weight for many years and Walter [name] been unsuccessful in either losing weight, maintaining weight loss, or reaching his healthy weight goal.  Walter Walter [name] currently in the action stage of change and ready to dedicate time achieving and maintaining a healthier weight. Walter Walter [name] interested in becoming our patient and working on intensive lifestyle modifications including (but not limited to) diet and exercise for weight loss.  When asked what else they would like to accomplish? Walter Walter [name] states: Adopt healthier eating patterns, Improve existing medical conditions, Improve quality of life, Improve appearance, and Lose a target amount of weight : 210 lb lbs in 6 months.   Weight history: Gained weight as adult+ Gained weight with medications. Walter [name] changed nutrition and started walking and Walter [name] lost weight recently but stalled with weight loss and would like to try medications to help loose another 20+ lbs. .    When asked how Walter [name] your weight affected you? Walter Walter [name] states: Walter [name] affected self-esteem, Problems with eating patterns, and Walter [name] affected mood   Weight history:  Walter Walter [name] starting to note weight gain during : adulthood.  Life events associated with weight gain include : education.   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  Their highest weight Walter [name] been:  280 lbs.  Desired weight: 200  Previous weight-loss programs :  [facility] .  Their maximum weight loss was:  242 lbs.  Their greatest challenge with dieting: none.  Weight  promoting medications identified: Other: HAART  Current or previous pharmacotherapy: None and Walter [name] interested in pharmacotherapy.  Response to medication: Never tried medications  Nutritional History:  Current nutrition plan: None.  How many times do you eat outside the home: > 7 per week  How often do they eat breakfast : none, they skip breakfast.  Number of times they eat per day: 1-2  What beverages do they drink: alcohol, type: wine, 2-3 drinks per week.   Use of artificial sweetners : No  Food intolerances or dislikes: none.  Food triggers: Stress, When angry or upset, and When Sad.  Food cravings: Sugary, Chocolate, and Starches  Do they struggle with excessive hunger or portion control : No   Current level of physical activity: Walking  Past medical history includes:   Past Medical History:  Diagnosis Date   Acute esophagitis 10/08/2015   AIDS (HCC) 10/08/2015   COVID-19 virus infection 12/19/2019   Essential hypertension    Hyperglycemia 11/30/2017   Hyperlipidemia 12/14/2022   Prediabetes 02/2019   Screen for STD (sexually transmitted disease) 01/23/2016   Seasonal allergies    Vaccine counseling 10/08/2023   Weight gain 12/14/2018     Objective   BP 126/82   Pulse 60   Temp 97.8 F (36.6 C)   Ht 5\' 9"  (1.753 m)   Wt 242 lb (109.8 kg)   SpO2 98%   BMI 35.74 kg/m  Walter Walter [name] was weighed on the bioimpedance scale: Body mass index Walter [name] 35.74 kg/m.    Anthropometrics:  Vitals Temp: 97.8 F (36.6 C) BP: 126/82 Pulse Rate: 60 SpO2: 98 %   Anthropometric  Measurements Height: 5\' 9"  (1.753 m) Weight: 242 lb (109.8 kg) BMI (Calculated): 35.72 Starting Weight: 242 lb Peak Weight: 280 lb Waist Measurement : 46 inches   Body Composition  Body Fat %: 31.8 % Fat Mass (lbs): 77 lbs Muscle Mass (lbs): 157 lbs Total Body Water (lbs): 117.2 lbs Visceral Fat Rating : 17   Other Clinical Data RMR: 2002 Fasting: Yes Labs: Yes Today's Visit #:  1 Starting Date: 12/08/22    Physical Exam:  General: Walter Walter [name] Walter [name] overweight, cooperative, alert, well developed, and in no acute distress. PSYCH: Walter [name] normal mood, affect and thought process.   HEENT: EOMI, sclerae are anicteric. Lungs: Normal breathing effort, no conversational dyspnea. Extremities: No edema.  Neurologic: No gross sensory or motor deficits. No tremors or fasciculations noted.    Diagnostic Data Reviewed  EKG: Normal sinus rhythm, rate 66. No conduction abnormalities, abnormal Q waves or chamber enlargement.  Indirect Calorimeter completed today shows a VO2 of 291 and a REE of 2002.  His calculated basal metabolic rate Walter [name] 0272 thus his resting energy expenditure slower than calculated.  Depression Screen  Walter Walter [name]'s PHQ-9 score was: 11.     08/04/2023    8:55 AM  Depression screen PHQ 2/9  Decreased Interest 1  Down, Depressed, Hopeless 1  PHQ - 2 Score 2  Altered sleeping 0  Tired, decreased energy 0  Change in appetite 1  Feeling bad or failure about yourself  0  Trouble concentrating 0  Moving slowly or fidgety/restless 0  Suicidal thoughts 0  PHQ-9 Score 3    Screening for Sleep Related Breathing Disorders  Walter Walter [name] denies daytime somnolence and denies waking up still tired. Patient denies a history of symptoms of OSA. Walter Walter [name] generally gets 4 or 5 hours of sleep per night, and states that Walter Walter [name] does not sleep well most nights. Snoring Walter [name] present. Apneic episodes are not present. Epworth Sleepiness Score Walter [name] 1.   BMET    Component Value Date/Time   NA 141 09/28/2023 0830   NA 139 08/04/2023 1014   K 4.0 09/28/2023 0830   CL 106 09/28/2023 0830   CO2 27 09/28/2023 0830   GLUCOSE 112 (H) 09/28/2023 0830   BUN 12 09/28/2023 0830   BUN 10 08/04/2023 1014   CREATININE 1.13 09/28/2023 0830   CALCIUM  9.4 09/28/2023 0830   GFRNONAA 73 05/29/2021 0930   GFRAA 85 05/29/2021 0930   Lab Results  Component Value Date   HGBA1C 5.6 08/04/2023   HGBA1C 5.8 (H)  11/30/2017   No results found for: "INSULIN " CBC    Component Value Date/Time   WBC 4.0 09/28/2023 0830   RBC 4.82 09/28/2023 0830   HGB 15.9 09/28/2023 0830   HGB 15.7 08/04/2023 1014   HCT 47.5 09/28/2023 0830   HCT 47.0 08/04/2023 1014   PLT 244 09/28/2023 0830   PLT 239 08/04/2023 1014   MCV 98.5 09/28/2023 0830   MCV 97 08/04/2023 1014   MCH 33.0 09/28/2023 0830   MCHC 33.5 09/28/2023 0830   RDW 12.7 09/28/2023 0830   RDW 13.0 08/04/2023 1014   Iron/TIBC/Ferritin/ %Sat No results found for: "IRON", "TIBC", "FERRITIN", "IRONPCTSAT" Lipid Panel     Component Value Date/Time   CHOL 136 09/28/2023 0830   CHOL 120 08/04/2023 1014   TRIG 58 09/28/2023 0830   HDL 62 09/28/2023 0830   HDL 51 08/04/2023 1014   CHOLHDL 2.2 09/28/2023 0830   VLDL 20 05/11/2017 1032   LDLCALC 61 09/28/2023 0830  Hepatic Function Panel     Component Value Date/Time   PROT 7.3 09/28/2023 0830   PROT 7.0 08/04/2023 1014   ALBUMIN 4.4 08/04/2023 1014   AST 36 09/28/2023 0830   ALT 38 09/28/2023 0830   ALKPHOS 90 08/04/2023 1014   BILITOT 0.7 09/28/2023 0830   BILITOT 0.5 08/04/2023 1014      Component Value Date/Time   TSH 1.370 12/08/2018 1038     Assessment and Plan   TREATMENT PLAN FOR OBESITY:  Recommended Dietary Goals  Bartolomeo Walter [name] currently in the action stage of change. As such, his goal Walter [name] to implement medically supervised weight loss plan.  Walter Walter [name] Walter [name] agreed to implement: the Category 3 plan - 1500 kcal per day  Behavioral Intervention  We discussed the following Behavioral Modification Strategies today: increasing lean protein intake to established goals, decreasing simple carbohydrates , increasing vegetables, increasing lower glycemic fruits, increasing fiber rich foods, avoiding skipping meals, increasing water intake, work on meal planning and preparation, reading food labels , keeping healthy foods at home, identifying sources and decreasing liquid calories, decreasing  eating out or consumption of processed foods, and making healthy choices when eating convenient foods, planning for success, and better snacking choices  Additional resources provided today:  Cat 3 packet  Recommended Physical Activity Goals  Daud Walter [name] been advised to work up to 150 minutes of moderate intensity aerobic activity a week and strengthening exercises 2-3 times per week for cardiovascular health, weight loss maintenance and preservation of muscle mass.   Walter Walter [name] Walter [name] agreed to :  Think about enjoyable ways to increase daily physical activity and overcoming barriers to exercise and Increase physical activity in their day and reduce sedentary time (increase NEAT).  Pharmacotherapy We will work on building a Therapist, art and behavioral strategies. We will discuss the role of pharmacotherapy as an adjunct at subsequent visits.   ASSOCIATED CONDITIONS ADDRESSED TODAY  Other Fatigue Walter Walter [name] denies daytime somnolence and denies waking up still tired. Patient denies a history of symptoms of OSA. Karlon generally gets 4 or 5 hours of sleep per night, and states that Walter Walter [name] does not sleep well most nights. Snoring Walter [name] present. Apneic episodes are not present. Epworth Sleepiness Score Walter [name] 1. Ethelle Herb does feel that his weight Walter [name] causing his energy to be lower than it should be. Fatigue may be related to obesity, depression or many other causes. Labs will be ordered, and in the meanwhile, Walter Walter [name] will focus on self care including making healthy food choices, increasing physical activity and focusing on stress reduction.  Shortness of Breath Walter Walter [name] notes increasing shortness of breath with exercising and seems to be worsening over time with weight gain. Walter Walter [name] notes getting out of breath sooner with activity than Walter Walter [name] used to. This Walter [name] not gotten worse recently. Walter Walter [name] denies shortness of breath at rest or orthopnea.Walter Walter [name] notes increasing shortness of breath with exercising and seems  to be worsening over time with weight gain. Walter Walter [name] notes getting out of breath sooner with activity than Walter Walter [name] used to. This Walter [name] not gotten worse recently. Walter Walter [name] denies shortness of breath at rest or orthopnea.  Other fatigue -     EKG 12-Lead -     Vitamin B12  SOB (shortness of breath) on exertion  Depression screen  Primary hypertension Assessment & Plan: Well controlled after weight loss.  No longer requiring blood pressure medications.  Most recent renal parameters are within normal limits.  Losing 10% of body weight will also help with maintaining  adequate blood pressure control.  Continue to monitor for now   HIV disease [facility]) Assessment & Plan: Most recent HIVVL - UD and CD4 380's. On HAART , good compliance. HAART contributing to weight gain and metabolic derangements.  Patient will begin with nutritional and behavioral strategies for weight management.   Prediabetes Assessment & Plan: Most recent A1c Walter [name]  Lab Results  Component Value Date   HGBA1C 5.6 08/04/2023   HGBA1C 5.8 (H) 11/30/2017    Patient aware of disease state and risk of progression. This may contribute to abnormal cravings, fatigue and diabetic complications without having diabetes.   We have discussed treatment options which include: losing 7 to 10% of body weight, increasing physical activity to a goal of 150 minutes a week at moderate intensity.  Advised to maintain a diet low on simple and processed carbohydrates.  Walter Walter [name] may also be a candidate for pharmacoprophylaxis with metformin or incretin mimetic.    Orders: -     Insulin , random -     Glucose, fasting  Pure hypercholesterolemia Assessment & Plan: LDL Walter [name] at goal. Elevated LDL may be secondary to nutrition, genetics and spillover effect from excess adiposity. Recommended LDL goal Walter [name] <70 to reduce the risk of fatty streaks and the progression to obstructive ASCVD in the future.   His 10 year risk Walter [name]: The 10-year ASCVD risk score (Arnett DK, et  al., 2019) Walter [name]: 3.5%  Lab Results  Component Value Date   CHOL 136 09/28/2023   HDL 62 09/28/2023   LDLCALC 61 09/28/2023   TRIG 58 09/28/2023   CHOLHDL 2.2 09/28/2023   Walter Walter [name] Walter [name] currently on atorvastatin  40 mg a day.  Walter Walter [name] will continue medication.  Patient counseled on risk of ASCVD associated with chronic HIV infection.  Continue weight loss therapy, losing 10% or more of body weight may improve condition. Also advised to reduce saturated fats in diet to less than 10% of daily calories.       Orders: -     TSH  Class 2 severe obesity with serious comorbidity and body mass index (BMI) of 35.0 to 35.9 in adult, unspecified obesity type [facility]) Assessment & Plan: See obesity treatment plan.  Patient Walter [name] on Biktarvy  which may contribute to weight gain but Walter Walter [name] Walter [name] doing well on the medication.  Walter Walter [name] Walter [name] also Walter [name] some metabolic derangements associated with HAART.  Orders: -     VITAMIN D  25 Hydroxy (Vit-D Deficiency, Fractures)    Follow-up  Walter Walter [name] was informed of the importance of frequent follow-up visits to maximize his success with intensive lifestyle modifications for his multiple health conditions. Walter Walter [name] was informed we would discuss his lab results at his next visit unless there Walter [name] a critical issue that needs to be addressed sooner. Walter Walter [name] agreed to keep his next visit at the agreed upon time to discuss these results.  Attestation Statement  This Walter [name] the patient's intake visit at [facility]. The patient's Health Questionnaire was reviewed at length. Included in the packet: current and past health history, medications, allergies, ROS, gynecologic history (women only), surgical history, family history, social history, weight history, weight loss surgery history (for those that have had weight loss surgery), nutritional evaluation, mood and food questionnaire, PHQ9, Epworth questionnaire, sleep habits questionnaire, patient life and health improvement goals questionnaire. These will  all be scanned into the patient's chart under media.   During the visit, I independently reviewed the patient's EKG, previous labs, bioimpedance scale results, and indirect calorimetry results. I  used this information to medically tailor a meal plan for the patient that will help him to lose weight and will improve his obesity-related conditions. I performed a medically necessary appropriate examination and/or evaluation. I discussed the assessment and treatment plan with the patient. The patient was provided an opportunity to ask questions and all were answered. The patient agreed with the plan and demonstrated an understanding of the instructions. Labs were ordered at this visit and will be reviewed at the next visit unless critical results need to be addressed immediately. Clinical information was updated and documented in the EMR.   In addition, they received basic education on identification of processed foods and reduction of these, different sources of lean proteins and complex carbohydrates and how to eat balanced by incorporation of whole foods.  Reviewed by clinician on day of visit: allergies, medications, problem list, medical history, surgical history, family history, social history, and previous encounter notes.  I have spent 60 minutes in the care of the patient today including: preparing to see patient (e.g. review and interpretation of tests, old notes ), obtaining and/or reviewing separately obtained history, performing a medically appropriate examination or evaluation, counseling and educating the patient, ordering medications, test or procedures, documenting clinical information in the electronic or other health care record, and independently interpreting results and communicating results to the patient, family, or caregiver      Ladd Picker, MD

## 2023-12-09 NOTE — Assessment & Plan Note (Signed)
 Most recent A1c is  Lab Results  Component Value Date   HGBA1C 5.6 08/04/2023   HGBA1C 5.8 (H) 11/30/2017    Patient aware of disease state and risk of progression. This may contribute to abnormal cravings, fatigue and diabetic complications without having diabetes.   We have discussed treatment options which include: losing 7 to 10% of body weight, increasing physical activity to a goal of 150 minutes a week at moderate intensity.  Advised to maintain a diet low on simple and processed carbohydrates.  He may also be a candidate for pharmacoprophylaxis with metformin or incretin mimetic.

## 2023-12-09 NOTE — Assessment & Plan Note (Addendum)
 Well controlled after weight loss.  No longer requiring blood pressure medications.  Most recent renal parameters are within normal limits.  Losing 10% of body weight will also help with maintaining adequate blood pressure control.  Continue to monitor for now

## 2023-12-10 ENCOUNTER — Other Ambulatory Visit (HOSPITAL_COMMUNITY): Payer: Self-pay

## 2023-12-10 LAB — TSH: TSH: 1.47 u[IU]/mL (ref 0.450–4.500)

## 2023-12-10 LAB — VITAMIN D 25 HYDROXY (VIT D DEFICIENCY, FRACTURES): Vit D, 25-Hydroxy: 31.5 ng/mL (ref 30.0–100.0)

## 2023-12-10 LAB — VITAMIN B12: Vitamin B-12: >2000 pg/mL — ABNORMAL HIGH (ref 232–1245)

## 2023-12-10 LAB — INSULIN, RANDOM: INSULIN: 8.3 u[IU]/mL (ref 2.6–24.9)

## 2023-12-10 LAB — GLUCOSE, FASTING: Glucose, Plasma: 107 mg/dL — ABNORMAL HIGH (ref 70–99)

## 2023-12-21 ENCOUNTER — Other Ambulatory Visit: Payer: Self-pay

## 2023-12-24 ENCOUNTER — Other Ambulatory Visit (HOSPITAL_COMMUNITY): Payer: Self-pay

## 2023-12-24 ENCOUNTER — Ambulatory Visit (INDEPENDENT_AMBULATORY_CARE_PROVIDER_SITE_OTHER): Payer: 59 | Admitting: Internal Medicine

## 2023-12-24 ENCOUNTER — Encounter (INDEPENDENT_AMBULATORY_CARE_PROVIDER_SITE_OTHER): Payer: Self-pay | Admitting: Internal Medicine

## 2023-12-24 VITALS — BP 129/78 | HR 61 | Temp 97.5°F | Ht 69.0 in | Wt 240.0 lb

## 2023-12-24 DIAGNOSIS — R7303 Prediabetes: Secondary | ICD-10-CM

## 2023-12-24 DIAGNOSIS — Z6835 Body mass index (BMI) 35.0-35.9, adult: Secondary | ICD-10-CM | POA: Diagnosis not present

## 2023-12-24 DIAGNOSIS — E66812 Obesity, class 2: Secondary | ICD-10-CM

## 2023-12-24 MED ORDER — PHENTERMINE HCL 37.5 MG PO TABS
18.7500 mg | ORAL_TABLET | Freq: Every day | ORAL | 0 refills | Status: DC
  Filled 2023-12-24: qty 15, 30d supply, fill #0

## 2023-12-24 NOTE — Assessment & Plan Note (Addendum)
Assessment and Plan    Obesity Presents for medical weight management. Currently on a 1500 calorie plan, adhering 75% of the time, and exercising 45 minutes, five times per week. Reports a weight loss of 2 pounds over two weeks, averaging 1 pound per week. Struggles with evening cravings, particularly when breakfast is skipped. Increased protein intake noted, with a sense of fullness. Discussed hunger vs. cravings and strategies to manage cravings. Consideration of pharmacotherapy for weight management due to insurance limitations and potential drug interactions with Biktarvy. Discussed phentermine as an appetite suppressant, its potential side effects including increased blood pressure, heart rate, anxiety, and insomnia.  Discussed the need for regular monitoring and the requirement to lose 5% of body weight within six months to continue medication. Discussed potential addition of metformin to offset weight gain associated with antiretroviral therapy - Prescribe phentermine 37.5 mg, instruct to take half a tablet (18.75 mg) 30 minutes before breakfast - Monitor blood pressure and heart rate - Consider adding metformin for cravings for sweets if needed - Schedule follow-up in three weeks to assess progress and side effects - Provide education on managing cravings and differentiating between hunger and cravings - Encourage continued adherence to 1500 calorie plan and regular exercise - Advise to reduce B12 supplement intake to every other day due to high levels

## 2023-12-24 NOTE — Assessment & Plan Note (Signed)
Reviewed recent labs his vitamin D levels were normal B12 levels were supranormal likely due to B12 supplementation advised to reduce to every other day his fasting blood sugar was 107 and in the prediabetes range.  His insulin levels were 8.3 close to normal.  Thyroid hormone was normal.  We will consider metformin for pharmacoprophylaxis.  He has been educated on the carb insulin model and is aware of the importance of maintaining a diet with a low glycemic index

## 2023-12-24 NOTE — Progress Notes (Signed)
Office: [phone]  /  Fax: [phone]  Weight Summary And Biometrics  Vitals Temp: (!) 97.5 F (36.4 C) BP: 129/78 Pulse Rate: 61 SpO2: 99 %   Anthropometric Measurements Height: 5\' 9"  (1.753 m) Weight: 240 lb (108.9 kg) BMI (Calculated): 35.43 Weight at Last Visit: 242 lb Weight Lost Since Last Visit: 2 lb Weight Gained Since Last Visit: 0 lb Starting Weight: 242 lb Total Weight Loss (lbs): 2 lb (0.907 kg) Peak Weight: 280 lb   Body Composition  Body Fat %: 31.2 % Fat Mass (lbs): 75 lbs Muscle Mass (lbs): 157.2 lbs Total Body Water (lbs): 116 lbs Visceral Fat Rating : 16    RMR: 2002  Today's Visit #: 2  Starting Date: 12/08/22   Subjective   Chief Complaint: Obesity  Christos is here to discuss his progress with his obesity treatment plan. He is on the the Category 3 Plan and states he is following his eating plan approximately 75 % of the time. He states he is exercising 45 minutes 5 times per week.  Weight Progress Since Last Visit:  This is the second visit.  Since last office visit he has lost 2 pounds.  For rate of about 1 pound per week He reports good adherence to reduced calorie nutritional plan. He has been working on reading food labels, not skipping meals, increasing protein intake at every meal, drinking more water, making healthier choices, reducing portion sizes, and incorporating more whole foods   Challenges affecting patient progress: strong hunger signals and/or impaired satiety / inhibitory control, medical comorbidities, and presence of obesogenic drugs.   Orexigenic Control: Reports problems with appetite and hunger signals.  Reports problems with satiety and satiation.  Denies problems with eating patterns and portion control.  Reports abnormal cravings. Denies feeling deprived or restricted.   Pharmacotherapy for weight management: He is currently taking no anti-obesity medication and patient would like to start antiobesity  medications early in the process .   Assessment and Plan   Treatment Plan For Obesity:  Recommended Dietary Goals  Kahlen is currently in the action stage of change. As such, his goal is to continue weight management plan. He has agreed to: continue current plan  Behavioral Health and Counseling  We discussed the following behavioral modification strategies today: practice mindfulness eating and understand the difference between hunger signals and cravings and continue to work on maintaining a reduced calorie state, getting the recommended amount of protein, incorporating whole foods, making healthy choices, staying well hydrated and practicing mindfulness when eating..  Additional education and resources provided today: Handout on FDA approved anti-obesity medications and adverse effects  Recommended Physical Activity Goals  Murry has been advised to work up to 150 minutes of moderate intensity aerobic activity a week and strengthening exercises 2-3 times per week for cardiovascular health, weight loss maintenance and preservation of muscle mass.   He has agreed to :  continue to gradually increase the amount and intensity of exercise routine  Pharmacotherapy  We discussed various medication options to help Wilfred with his weight loss efforts and we both agreed to : start anti-obesity medication.  In addition to reduced calorie nutrition plan (RCNP), behavioral strategies and physical activity, Keyan would benefit from pharmacotherapy to assist with hunger signals, satiety and cravings. This will reduce obesity-related health risks by inducing weight loss, and help reduce food consumption and adherence to [facility]) . It may also improve QOL by improving self-confidence and reduce the  setbacks associated with metabolic adaptations.  After discussion of treatment options, mechanisms of action, benefits, side effects, contraindications and shared decision making he is agreeable to  starting phentermine 18.75 mg once a week.    Patient also made aware that medication is indicated for long-term management of obesity and the risk of weight regain following discontinuation of treatment and hence the importance of adhering to medical weight loss plan.  He does not have coverage for GLP-1.  Topiramate interacts with his Biktarvy and could potentially increase the risk of metabolic acidosis. We reviewed state registry for controlled substances no other controlled medications identified. We also reviewed and  signed controlled substance agreement today   Associated Conditions Impacted by Obesity Treatment  Class 2 severe obesity with serious comorbidity and body mass index (BMI) of 35.0 to 35.9 in adult, unspecified obesity type [facility]) Assessment & Plan: Assessment and Plan    Obesity Presents for medical weight management. Currently on a 1500 calorie plan, adhering 75% of the time, and exercising 45 minutes, five times per week. Reports a weight loss of 2 pounds over two weeks, averaging 1 pound per week. Struggles with evening cravings, particularly when breakfast is skipped. Increased protein intake noted, with a sense of fullness. Discussed hunger vs. cravings and strategies to manage cravings. Consideration of pharmacotherapy for weight management due to insurance limitations and potential drug interactions with Biktarvy. Discussed phentermine as an appetite suppressant, its potential side effects including increased blood pressure, heart rate, anxiety, and insomnia.  Discussed the need for regular monitoring and the requirement to lose 5% of body weight within six months to continue medication. Discussed potential addition of metformin to offset weight gain associated with antiretroviral therapy - Prescribe phentermine 37.5 mg, instruct to take half a tablet (18.75 mg) 30 minutes before breakfast - Monitor blood pressure and heart rate - Consider adding metformin for cravings for  sweets if needed - Schedule follow-up in three weeks to assess progress and side effects - Provide education on managing cravings and differentiating between hunger and cravings - Encourage continued adherence to 1500 calorie plan and regular exercise - Advise to reduce B12 supplement intake to every other day due to high levels         Orders: -     Phentermine HCl; Take 0.5 tablets (18.75 mg total) by mouth daily before breakfast.  Dispense: 15 tablet; Refill: 0  Prediabetes Assessment & Plan: Reviewed recent labs his vitamin D levels were normal B12 levels were supranormal likely due to B12 supplementation advised to reduce to every other day his fasting blood sugar was 107 and in the prediabetes range.  His insulin levels were 8.3 close to normal.  Thyroid hormone was normal.  We will consider metformin for pharmacoprophylaxis.  He has been educated on the carb insulin model and is aware of the importance of maintaining a diet with a low glycemic index      Objective   Physical Exam:  Blood pressure 129/78, pulse 61, temperature (!) 97.5 F (36.4 C), height 5\' 9"  (1.753 m), weight 240 lb (108.9 kg), SpO2 99%. Body mass index is 35.44 kg/m.  General: He is overweight, cooperative, alert, well developed, and in no acute distress. PSYCH: Has normal mood, affect and thought process.   HEENT: EOMI, sclerae are anicteric. Lungs: Normal breathing effort, no conversational dyspnea. Extremities: No edema.  Neurologic: No gross sensory or motor deficits. No tremors or fasciculations noted.    Diagnostic Data Reviewed:  BMET    Component Value Date/Time  NA 141 09/28/2023 0830   NA 139 08/04/2023 1014   K 4.0 09/28/2023 0830   CL 106 09/28/2023 0830   CO2 27 09/28/2023 0830   GLUCOSE 112 (H) 09/28/2023 0830   BUN 12 09/28/2023 0830   BUN 10 08/04/2023 1014   CREATININE 1.13 09/28/2023 0830   CALCIUM 9.4 09/28/2023 0830   GFRNONAA 73 05/29/2021 0930   GFRAA 85 05/29/2021  0930   Lab Results  Component Value Date   HGBA1C 5.6 08/04/2023   HGBA1C 5.8 (H) 11/30/2017   Lab Results  Component Value Date   INSULIN 8.3 12/09/2023   Lab Results  Component Value Date   TSH 1.470 12/09/2023   CBC    Component Value Date/Time   WBC 4.0 09/28/2023 0830   RBC 4.82 09/28/2023 0830   HGB 15.9 09/28/2023 0830   HGB 15.7 08/04/2023 1014   HCT 47.5 09/28/2023 0830   HCT 47.0 08/04/2023 1014   PLT 244 09/28/2023 0830   PLT 239 08/04/2023 1014   MCV 98.5 09/28/2023 0830   MCV 97 08/04/2023 1014   MCH 33.0 09/28/2023 0830   MCHC 33.5 09/28/2023 0830   RDW 12.7 09/28/2023 0830   RDW 13.0 08/04/2023 1014   Iron Studies No results found for: "IRON", "TIBC", "FERRITIN", "IRONPCTSAT" Lipid Panel     Component Value Date/Time   CHOL 136 09/28/2023 0830   CHOL 120 08/04/2023 1014   TRIG 58 09/28/2023 0830   HDL 62 09/28/2023 0830   HDL 51 08/04/2023 1014   CHOLHDL 2.2 09/28/2023 0830   VLDL 20 05/11/2017 1032   LDLCALC 61 09/28/2023 0830   Hepatic Function Panel     Component Value Date/Time   PROT 7.3 09/28/2023 0830   PROT 7.0 08/04/2023 1014   ALBUMIN 4.4 08/04/2023 1014   AST 36 09/28/2023 0830   ALT 38 09/28/2023 0830   ALKPHOS 90 08/04/2023 1014   BILITOT 0.7 09/28/2023 0830   BILITOT 0.5 08/04/2023 1014      Component Value Date/Time   TSH 1.470 12/09/2023 0910   Nutritional Lab Results  Component Value Date   VD25OH 31.5 12/09/2023    Follow-Up   No follow-ups on file.Marland Kitchen He was informed of the importance of frequent follow up visits to maximize his success with intensive lifestyle modifications for his multiple health conditions.  Attestation Statement   Reviewed by clinician on day of visit: allergies, medications, problem list, medical history, surgical history, family history, social history, and previous encounter notes.   I have spent 40 minutes in the care of the patient today including: preparing to see patient (e.g.  review and interpretation of tests, old notes ), obtaining and/or reviewing separately obtained history, performing a medically appropriate examination or evaluation, counseling and educating the patient, ordering medications, test or procedures, documenting clinical information in the electronic or other health care record, and independently interpreting results and communicating results to the patient, family, or caregiver   Worthy Rancher, MD

## 2023-12-28 ENCOUNTER — Other Ambulatory Visit (HOSPITAL_COMMUNITY): Payer: Self-pay

## 2023-12-29 ENCOUNTER — Other Ambulatory Visit: Payer: Self-pay

## 2023-12-29 NOTE — Progress Notes (Signed)
 Specialty Pharmacy Refill Coordination Note  Walter [name] is a 48 y.o. male contacted today regarding refills of specialty medication(s) Bictegravir-Emtricitab-Tenofov (Biktarvy )   Patient requested Delivery   Delivery date: 01/01/24   Verified address: [address]   Medication will be filled on 12/31/23.

## 2024-01-12 ENCOUNTER — Ambulatory Visit (INDEPENDENT_AMBULATORY_CARE_PROVIDER_SITE_OTHER): Payer: 59 | Admitting: Internal Medicine

## 2024-01-12 ENCOUNTER — Other Ambulatory Visit (HOSPITAL_COMMUNITY): Payer: Self-pay

## 2024-01-12 ENCOUNTER — Encounter (INDEPENDENT_AMBULATORY_CARE_PROVIDER_SITE_OTHER): Payer: Self-pay | Admitting: Internal Medicine

## 2024-01-12 VITALS — BP 130/80 | HR 77 | Temp 97.7°F | Ht 69.0 in | Wt 236.0 lb

## 2024-01-12 DIAGNOSIS — R7303 Prediabetes: Secondary | ICD-10-CM | POA: Diagnosis not present

## 2024-01-12 DIAGNOSIS — E66812 Obesity, class 2: Secondary | ICD-10-CM | POA: Diagnosis not present

## 2024-01-12 DIAGNOSIS — Z6835 Body mass index (BMI) 35.0-35.9, adult: Secondary | ICD-10-CM

## 2024-01-12 MED ORDER — PHENTERMINE HCL 37.5 MG PO TABS
37.5000 mg | ORAL_TABLET | Freq: Every day | ORAL | 0 refills | Status: DC
  Filled 2024-01-12: qty 30, 30d supply, fill #0

## 2024-01-12 NOTE — Progress Notes (Signed)
Office: [phone]  /  Fax: [phone]  Weight Summary And Biometrics  Vitals Temp: 97.7 F (36.5 C) BP: 130/80 Pulse Rate: 77 SpO2: 100 %   Anthropometric Measurements Height: 5\' 9"  (1.753 m) Weight: 236 lb (107 kg) BMI (Calculated): 34.84 Weight at Last Visit: 240 lb Weight Lost Since Last Visit: 4 lb Weight Gained Since Last Visit: 0 lb Starting Weight: 242 lb Total Weight Loss (lbs): 6 lb (2.722 kg) Peak Weight: 280 lb   Body Composition  Body Fat %: 30.2 % Fat Mass (lbs): 71.2 lbs Muscle Mass (lbs): 156.6 lbs Total Body Water (lbs): 115.2 lbs Visceral Fat Rating : 16    RMR: 2002  Today's Visit #: 3  Starting Date: 12/08/22   Subjective   Chief Complaint: Obesity  Walter [name] is here to discuss his progress with his obesity treatment plan. He is on the the Category 3 Plan and states he is following his eating plan approximately 85 % of the time. He states he is exercising 60 minutes 7 times per week.  Weight Progress Since Last Visit:  Discussed the use of AI scribe software for clinical note transcription with the patient, who gave verbal consent to proceed.  History of Present Illness   Walter [name] is a 48 year old male who presents for medical weight management.  He has lost four pounds since his last visit and is currently taking phentermine. Initially, he experienced mild side effects, described as a feeling of 'oh, what is this? ', but now tolerates the medication well without significant side effects. No issues with sleep, anxiety, or feeling shaky.  His nutritional habits include portion-controlled meals with nuts, carrots, and salads with protein such as grilled chicken. Breakfast typically consists of a protein shake, a protein bar, and a cup of tea. He tends to eat the most during lunch and snacks throughout the day but does not feel hungry, attributing this to the medication. He notes a decrease in evening cravings, which he manages by  drinking water.  He engages in physical activity by going to the gym seven days a week but acknowledges the need to reduce the frequency due to a recent muscle pull. He plans to incorporate rest days and low-impact activities such as stretching and yoga into his routine.  He has experienced significant family stress with the recent passing of several family members, including his mother's brother, grandmother, and other relatives. Despite this, he reports coping well with the support of grief counseling.      Challenges affecting patient progress: presence of obesogenic drugs.   Orexigenic Control: Reports problems with appetite and hunger signals.  Reports problems with satiety and satiation.  Denies problems with eating patterns and portion control.  Reports abnormal cravings. Denies feeling deprived or restricted.   Pharmacotherapy for weight management: He is currently taking Phentermine (longterm use, single agent)  with adequate clinical response  and without side effects..   Assessment and Plan   Treatment Plan For Obesity:  Recommended Dietary Goals  Walter [name] is currently in the action stage of change. As such, his goal is to continue weight management plan. He has agreed to: continue current plan  Behavioral Health and Counseling  We discussed the following behavioral modification strategies today: continue to work on maintaining a reduced calorie state, getting the recommended amount of protein, incorporating whole foods, making healthy choices, staying well hydrated and practicing mindfulness when eating..  Additional education and resources provided today: None  Recommended Physical Activity  Goals  Walter [name] has been advised to work up to 150 minutes of moderate intensity aerobic activity a week and strengthening exercises 2-3 times per week for cardiovascular health, weight loss maintenance and preservation of muscle mass.   He has agreed to :  Recommend resting 1 day  in between strengthening workouts to avoid injuries and allow for muscle repair  Pharmacotherapy  We discussed various medication options to help Walter [name] with his weight loss efforts and we both agreed to : increase phentermine to 37.5 mg once daily.  We again reviewed indications, contraindications and long-term off label use of phentermine.   Associated Conditions Impacted by Obesity Treatment  Prediabetes Assessment & Plan: He has been maintaining a diet low in simple and processed carbs.  He is also engaging in physical activity.  He will continue with current weight management strategy.  Consider metformin for pharmacoprophylaxis.  May also help offset the weight gain associated with antiretroviral therapy.   Class 2 severe obesity with serious comorbidity and body mass index (BMI) of 35.0 to 35.9 in adult, unspecified obesity type [facility]) Assessment & Plan: - Refer to obesity management plan - Continue with current weight management strategy. - Discussed adjustments to overcome identified barriers. - Increase phentermine to 37.5 mg once daily for appetite suppression - Reinforce dietary and exercise goals.       Orders: -     Phentermine HCl; Take 1 tablet (37.5 mg total) by mouth daily before breakfast.  Dispense: 30 tablet; Refill: 0      Objective   Physical Exam:  Blood pressure 130/80, pulse 77, temperature 97.7 F (36.5 C), height 5\' 9"  (1.753 m), weight 236 lb (107 kg), SpO2 100%. Body mass index is 34.85 kg/m.  General: He is overweight, cooperative, alert, well developed, and in no acute distress. PSYCH: Has normal mood, affect and thought process.   HEENT: EOMI, sclerae are anicteric. Lungs: Normal breathing effort, no conversational dyspnea. Extremities: No edema.  Neurologic: No gross sensory or motor deficits. No tremors or fasciculations noted.    Diagnostic Data Reviewed:  BMET    Component Value Date/Time   NA 141 09/28/2023 0830   NA 139  08/04/2023 1014   K 4.0 09/28/2023 0830   CL 106 09/28/2023 0830   CO2 27 09/28/2023 0830   GLUCOSE 112 (H) 09/28/2023 0830   BUN 12 09/28/2023 0830   BUN 10 08/04/2023 1014   CREATININE 1.13 09/28/2023 0830   CALCIUM 9.4 09/28/2023 0830   GFRNONAA 73 05/29/2021 0930   GFRAA 85 05/29/2021 0930   Lab Results  Component Value Date   HGBA1C 5.6 08/04/2023   HGBA1C 5.8 (H) 11/30/2017   Lab Results  Component Value Date   INSULIN 8.3 12/09/2023   Lab Results  Component Value Date   TSH 1.470 12/09/2023   CBC    Component Value Date/Time   WBC 4.0 09/28/2023 0830   RBC 4.82 09/28/2023 0830   HGB 15.9 09/28/2023 0830   HGB 15.7 08/04/2023 1014   HCT 47.5 09/28/2023 0830   HCT 47.0 08/04/2023 1014   PLT 244 09/28/2023 0830   PLT 239 08/04/2023 1014   MCV 98.5 09/28/2023 0830   MCV 97 08/04/2023 1014   MCH 33.0 09/28/2023 0830   MCHC 33.5 09/28/2023 0830   RDW 12.7 09/28/2023 0830   RDW 13.0 08/04/2023 1014   Iron Studies No results found for: "IRON", "TIBC", "FERRITIN", "IRONPCTSAT" Lipid Panel     Component Value Date/Time   CHOL 136  09/28/2023 0830   CHOL 120 08/04/2023 1014   TRIG 58 09/28/2023 0830   HDL 62 09/28/2023 0830   HDL 51 08/04/2023 1014   CHOLHDL 2.2 09/28/2023 0830   VLDL 20 05/11/2017 1032   LDLCALC 61 09/28/2023 0830   Hepatic Function Panel     Component Value Date/Time   PROT 7.3 09/28/2023 0830   PROT 7.0 08/04/2023 1014   ALBUMIN 4.4 08/04/2023 1014   AST 36 09/28/2023 0830   ALT 38 09/28/2023 0830   ALKPHOS 90 08/04/2023 1014   BILITOT 0.7 09/28/2023 0830   BILITOT 0.5 08/04/2023 1014      Component Value Date/Time   TSH 1.470 12/09/2023 0910   Nutritional Lab Results  Component Value Date   VD25OH 31.5 12/09/2023    Follow-Up   Return in about 4 weeks (around 02/09/2024) for For Weight Mangement with Dr. Rikki Spearing.Marland Kitchen He was informed of the importance of frequent follow up visits to maximize his success with intensive  lifestyle modifications for his multiple health conditions.  Attestation Statement   Reviewed by clinician on day of visit: allergies, medications, problem list, medical history, surgical history, family history, social history, and previous encounter notes.     Worthy Rancher, MD

## 2024-01-12 NOTE — Assessment & Plan Note (Signed)
He has been maintaining a diet low in simple and processed carbs.  He is also engaging in physical activity.  He will continue with current weight management strategy.  Consider metformin for pharmacoprophylaxis.  May also help offset the weight gain associated with antiretroviral therapy.

## 2024-01-12 NOTE — Assessment & Plan Note (Addendum)
-   Refer to obesity management plan - Continue with current weight management strategy. - Discussed adjustments to overcome identified barriers. - Increase phentermine to 37.5 mg once daily for appetite suppression - Reinforce dietary and exercise goals.

## 2024-01-14 ENCOUNTER — Ambulatory Visit (INDEPENDENT_AMBULATORY_CARE_PROVIDER_SITE_OTHER): Payer: 59 | Admitting: Internal Medicine

## 2024-01-19 ENCOUNTER — Other Ambulatory Visit: Payer: Self-pay

## 2024-01-19 ENCOUNTER — Other Ambulatory Visit (HOSPITAL_COMMUNITY): Payer: Self-pay

## 2024-01-21 DIAGNOSIS — F320 Major depressive disorder, single episode, mild: Secondary | ICD-10-CM | POA: Diagnosis not present

## 2024-01-24 DIAGNOSIS — K1121 Acute sialoadenitis: Secondary | ICD-10-CM | POA: Diagnosis not present

## 2024-01-26 ENCOUNTER — Other Ambulatory Visit (HOSPITAL_COMMUNITY): Payer: Self-pay

## 2024-01-26 ENCOUNTER — Other Ambulatory Visit: Payer: Self-pay

## 2024-01-26 NOTE — Progress Notes (Signed)
 Specialty Pharmacy Refill Coordination Note  Walter [name] is a 48 y.o. male contacted today regarding refills of specialty medication(s) Biktarvy.  Patient requested (Patient-Rptd) Delivery   Delivery date: (Patient-Rptd) 01/29/24   Verified address: (Patient-Rptd) [address]  [place] [address]   Medication will be filled on 01/28/24.

## 2024-01-28 ENCOUNTER — Encounter: Payer: Self-pay | Admitting: Pediatrics

## 2024-01-28 ENCOUNTER — Other Ambulatory Visit: Payer: Self-pay

## 2024-02-01 ENCOUNTER — Ambulatory Visit: Payer: 59 | Admitting: Family Medicine

## 2024-02-09 ENCOUNTER — Ambulatory Visit (INDEPENDENT_AMBULATORY_CARE_PROVIDER_SITE_OTHER): Payer: 59 | Admitting: Internal Medicine

## 2024-02-09 ENCOUNTER — Other Ambulatory Visit (HOSPITAL_COMMUNITY): Payer: Self-pay

## 2024-02-09 ENCOUNTER — Encounter (INDEPENDENT_AMBULATORY_CARE_PROVIDER_SITE_OTHER): Payer: Self-pay | Admitting: Internal Medicine

## 2024-02-09 VITALS — BP 128/79 | HR 68 | Temp 98.0°F | Ht 69.0 in | Wt 224.0 lb

## 2024-02-09 DIAGNOSIS — E66812 Obesity, class 2: Secondary | ICD-10-CM

## 2024-02-09 DIAGNOSIS — R7303 Prediabetes: Secondary | ICD-10-CM | POA: Diagnosis not present

## 2024-02-09 DIAGNOSIS — Z6835 Body mass index (BMI) 35.0-35.9, adult: Secondary | ICD-10-CM

## 2024-02-09 MED ORDER — PHENTERMINE HCL 37.5 MG PO TABS
37.5000 mg | ORAL_TABLET | Freq: Every day | ORAL | 0 refills | Status: DC
  Filled 2024-02-09: qty 30, 30d supply, fill #0

## 2024-02-09 NOTE — Progress Notes (Signed)
 Office: [phone]  /  Fax: [phone]  Weight Summary And Biometrics  Vitals Temp: 98 F (36.7 C) BP: 128/79 Pulse Rate: 68 SpO2: 100 %   Anthropometric Measurements Height: 5\' 9"  (1.753 m) Weight: 224 lb (101.6 kg) BMI (Calculated): 33.06 Weight at Last Visit: 236 lb Weight Lost Since Last Visit: 12 lb Weight Gained Since Last Visit: 0 Starting Weight: 242 lb Total Weight Loss (lbs): 18 lb (8.165 kg) Peak Weight: 280 lb   Body Composition  Body Fat %: 29 % Fat Mass (lbs): 65.2 lbs Muscle Mass (lbs): 151.8 lbs Total Body Water (lbs): 111 lbs Visceral Fat Rating : 14    RMR: 2002  Today's Visit #: 4  Starting Date: 12/08/22   Subjective   Chief Complaint: Obesity  Interval History Discussed the use of AI scribe software for clinical note transcription with the patient, who gave verbal consent to proceed.  History of Present Illness   Walter [name] is a 48 year old male who presents for medical weight management.  He has lost twelve pounds since his last visit, primarily through increased physical activity and dietary changes. He adheres to a category three meal plan approximately ninety percent of the time and consumes more whole foods. He feels he is not getting the recommended amount of protein. He maintains adequate hydration, occasionally skips meals, and experiences high levels of stress. He sleeps about six to seven hours per night.  His current diet includes an [employer] protein shake and a protein bar for breakfast, totaling approximately 270 calories, along with a cup of tea. He aims for a daily calorie intake of around 1500 calories but might be consuming less due to his health-conscious mindset. He avoids fast food, preferring to prepare meals at home, such as sandwiches for work. He feels hungry, which he attributes to not consuming enough calories, and sometimes ignores his hunger.  He exercises five to six days a week for about sixty  minutes, focusing on cardio and yoga. His routine includes biking, treadmill, yoga, and stretching. He enjoys his workouts and is 'in the gym literally every day,' although he acknowledges he might be overdoing it. He is conscious of his calorie intake, counting calories diligently, but struggles to meet his protein requirements, especially given his high level of physical activity.  He is currently taking phentermine and reports no side effects such as sleep disturbances, palpitations, or mood changes. He avoids energy drinks and primarily consumes tea. He notes experiencing dry mouth, which he attributes to the medication, and drinks about a gallon of water daily to manage this symptom.        Challenges affecting patient progress: none.    Pharmacotherapy for weight management: He is currently taking Phentermine (longterm use, single agent)  with adequate clinical response  and without side effects..   Assessment and Plan   Treatment Plan For Obesity:  Recommended Dietary Goals  Walter [name] is currently in the action stage of change. As such, his goal is to continue weight management plan. He has agreed to: continue current plan; increase calories by 200 on days of working out  [employer] [facility]  We discussed the following behavioral modification strategies today: continue to work on maintaining a reduced calorie state, getting the recommended amount of protein, incorporating whole foods, making healthy choices, staying well hydrated and practicing mindfulness when eating..  Patient to avoid skipping meals  Additional education and resources provided today: None  Recommended Physical Activity Goals  Walter [name]  has been [name] to work up to 150 minutes of moderate intensity aerobic activity a week and strengthening exercises 2-3 times per week for cardiovascular health, weight loss maintenance and preservation of muscle mass.   He has agreed to :  Decrease workouts to 5 days  a week with rest in between every 2 to 3 days  Pharmacotherapy  We discussed various medication options to help Walter [name] and we both agreed to : adequate clinical response to current dose, continue current regimen  Associated Conditions Impacted by Obesity Treatment  Class 2 severe obesity with serious comorbidity and body mass index (BMI) of 35.0 to 35.9 in adult, unspecified obesity type [facility]) Assessment & Plan: He has lost 12 pounds since the last visit, with approximately 50% attributed to muscle loss, likely due to inadequate caloric and protein intake combined with increased physical activity. He consumes around 1500 calories per day, which may be insufficient given his activity level. Emphasized the importance of increasing protein intake to prevent further muscle loss and suggested increasing caloric intake, especially on days of vigorous exercise. Explained that losing more than 20% of muscle mass is undesirable and that his current rate of weight loss is too rapid, risking further muscle breakdown. - Increase caloric intake on workout days to 1700 calories. - Increase protein intake to 120-140 grams per day, focusing on lean protein sources  - Consider using protein shakes and bars to supplement protein intake. - Incorporate rest days into the exercise routine to allow for muscle recovery. - Continue phentermine as prescribed, monitor for side effects. - Reassess muscle mass and body composition in 3 weeks.  Orders: -     Phentermine HCl; Take 1 tablet (37.5 mg total) by mouth daily before breakfast.  Dispense: 30 tablet; Refill: 0  Prediabetes Assessment & Plan: He has been maintaining a diet low in simple and processed carbs.  He is also engaging in physical activity.  He will continue with current weight management strategy.  Consider metformin for pharmacoprophylaxis.  May also help offset the weight gain associated with antiretroviral therapy.       General Health Maintenance He is actively engaged in a weight management program with lifestyle changes, including increased physical activity and dietary modifications. He experiences dry mouth, likely a side effect of phentermine, and maintains adequate hydration. Provided guidance on meal planning and protein intake to support weight management goals. Discussed the importance of listening to physiological hunger cues, as ignoring hunger indicates insufficient caloric intake. - Encourage adequate hydration, - Incorporate a variety of protein sources, including plant-based options like beans and lentils. - Plan meals ahead to ensure balanced nutrition and adequate protein intake. - Consider using pre-made healthy meal options for convenience. - Avoid energy drinks and limit caffeine intake.          Objective   Physical Exam:  Blood pressure 128/79, pulse 68, temperature 98 F (36.7 C), height 5\' 9"  (1.753 m), weight 224 lb (101.6 kg), SpO2 100%. Body mass index is 33.08 kg/m.  General: He is overweight, cooperative, alert, well developed, and in no acute distress. PSYCH: Has normal mood, affect and thought process.   HEENT: EOMI, sclerae are anicteric. Lungs: Normal breathing effort, no conversational dyspnea. Extremities: No edema.  Neurologic: No gross sensory or motor deficits. No tremors or fasciculations noted.    Diagnostic Data Reviewed:  BMET    Component Value Date/Time   NA 141 09/28/2023 0830   NA 139 08/04/2023 1014  K 4.0 09/28/2023 0830   CL 106 09/28/2023 0830   CO2 27 09/28/2023 0830   GLUCOSE 112 (H) 09/28/2023 0830   BUN 12 09/28/2023 0830   BUN 10 08/04/2023 1014   CREATININE 1.13 09/28/2023 0830   CALCIUM 9.4 09/28/2023 0830   GFRNONAA 73 05/29/2021 0930   GFRAA 85 05/29/2021 0930   Lab Results  Component Value Date   HGBA1C 5.6 08/04/2023   HGBA1C 5.8 (H) 11/30/2017   Lab Results  Component Value Date   INSULIN 8.3 12/09/2023   Lab  Results  Component Value Date   TSH 1.470 12/09/2023   CBC    Component Value Date/Time   WBC 4.0 09/28/2023 0830   RBC 4.82 09/28/2023 0830   HGB 15.9 09/28/2023 0830   HGB 15.7 08/04/2023 1014   HCT 47.5 09/28/2023 0830   HCT 47.0 08/04/2023 1014   PLT 244 09/28/2023 0830   PLT 239 08/04/2023 1014   MCV 98.5 09/28/2023 0830   MCV 97 08/04/2023 1014   MCH 33.0 09/28/2023 0830   MCHC 33.5 09/28/2023 0830   RDW 12.7 09/28/2023 0830   RDW 13.0 08/04/2023 1014   Iron Studies No results found for: "IRON", "TIBC", "FERRITIN", "IRONPCTSAT" Lipid Panel     Component Value Date/Time   CHOL 136 09/28/2023 0830   CHOL 120 08/04/2023 1014   TRIG 58 09/28/2023 0830   HDL 62 09/28/2023 0830   HDL 51 08/04/2023 1014   CHOLHDL 2.2 09/28/2023 0830   VLDL 20 05/11/2017 1032   LDLCALC 61 09/28/2023 0830   Hepatic Function Panel     Component Value Date/Time   PROT 7.3 09/28/2023 0830   PROT 7.0 08/04/2023 1014   ALBUMIN 4.4 08/04/2023 1014   AST 36 09/28/2023 0830   ALT 38 09/28/2023 0830   ALKPHOS 90 08/04/2023 1014   BILITOT 0.7 09/28/2023 0830   BILITOT 0.5 08/04/2023 1014      Component Value Date/Time   TSH 1.470 12/09/2023 0910   Nutritional Lab Results  Component Value Date   VD25OH 31.5 12/09/2023    Medications: Outpatient Encounter Medications as of 02/09/2024  Medication Sig   atorvastatin (LIPITOR) 40 MG tablet Take 1 tablet (40 mg total) by mouth daily.   bictegravir-emtricitabine-tenofovir AF (BIKTARVY) 50-200-25 MG TABS tablet Take 1 tablet by mouth daily.   [DISCONTINUED] phentermine (ADIPEX-P) 37.5 MG tablet Take 1 tablet (37.5 mg total) by mouth daily before breakfast.   phentermine (ADIPEX-P) 37.5 MG tablet Take 1 tablet (37.5 mg total) by mouth daily before breakfast.   No facility-administered encounter medications on file as of 02/09/2024.     Follow-Up   Return in about 3 weeks (around 03/01/2024) for For Weight Mangement with Dr. Rikki Spearing.Marland Kitchen He  was informed of the importance of frequent follow up visits to maximize his success with intensive lifestyle modifications for his multiple health conditions.  Attestation Statement   Reviewed by clinician on day of visit: allergies, medications, problem list, medical history, surgical history, family history, social history, and previous encounter notes.     Worthy Rancher, MD

## 2024-02-09 NOTE — Assessment & Plan Note (Signed)
 He has lost 12 pounds since the last visit, with approximately 50% attributed to muscle loss, likely due to inadequate caloric and protein intake combined with increased physical activity. He consumes around 1500 calories per day, which may be insufficient given his activity level. Emphasized the importance of increasing protein intake to prevent further muscle loss and suggested increasing caloric intake, especially on days of vigorous exercise. Explained that losing more than 20% of muscle mass is undesirable and that his current rate of weight loss is too rapid, risking further muscle breakdown. - Increase caloric intake on workout days to 1700 calories. - Increase protein intake to 120-140 grams per day, focusing on lean protein sources  - Consider using protein shakes and bars to supplement protein intake. - Incorporate rest days into the exercise routine to allow for muscle recovery. - Continue phentermine as prescribed, monitor for side effects. - Reassess muscle mass and body composition in 3 weeks.

## 2024-02-09 NOTE — Assessment & Plan Note (Signed)
 He has been maintaining a diet low in simple and processed carbs.  He is also engaging in physical activity.  He will continue with current weight management strategy.  Consider metformin for pharmacoprophylaxis.  May also help offset the weight gain associated with antiretroviral therapy.

## 2024-02-13 ENCOUNTER — Telehealth: Admitting: Nurse Practitioner

## 2024-02-13 DIAGNOSIS — B9689 Other specified bacterial agents as the cause of diseases classified elsewhere: Secondary | ICD-10-CM

## 2024-02-13 DIAGNOSIS — J019 Acute sinusitis, unspecified: Secondary | ICD-10-CM | POA: Diagnosis not present

## 2024-02-13 MED ORDER — AMOXICILLIN-POT CLAVULANATE 875-125 MG PO TABS: 1.0000 | ORAL_TABLET | Freq: Two times a day (BID) | ORAL | 0 refills | Status: AC

## 2024-02-13 MED ORDER — FLUTICASONE PROPIONATE 50 MCG/ACT NA SUSP: 2.0000 | Freq: Every day | NASAL | 6 refills | Status: DC

## 2024-02-13 NOTE — Progress Notes (Signed)
 E-Visit for Sinus Problems  We are sorry that you are not feeling well.  Here is how we plan to help!  Based on what you have shared with me it looks like you have sinusitis.  Sinusitis is inflammation and infection in the sinus cavities of the head.  Based on your presentation I believe you most likely have Acute Bacterial Sinusitis.  This is an infection caused by bacteria and is treated with antibiotics. I have prescribed Augmentin 875mg /125mg  one tablet twice daily with food, for 7 days. I have also sent a steroid nasal spray.  You may use an oral decongestant such as Mucinex D or if you have glaucoma or high blood pressure use plain Mucinex. Saline nasal spray help and can safely be used as often as needed for congestion.  If you develop worsening sinus pain, fever or notice severe headache and vision changes, or if symptoms are not better after completion of antibiotic, please schedule an appointment with a health care provider.    Sinus infections are not as easily transmitted as other respiratory infection, however we still recommend that you avoid close contact with loved ones, especially the very young and elderly.  Remember to wash your hands thoroughly throughout the day as this is the number one way to prevent the spread of infection!  Home Care: Only take medications as instructed by your medical team. Complete the entire course of an antibiotic. Do not take these medications with alcohol. A steam or ultrasonic humidifier can help congestion.  You can place a towel over your head and breathe in the steam from hot water coming from a faucet. Avoid close contacts especially the very young and the elderly. Cover your mouth when you cough or sneeze. Always remember to wash your hands.  Get Help Right Away If: You develop worsening fever or sinus pain. You develop a severe head ache or visual changes. Your symptoms persist after you have completed your treatment plan.  Make sure  you Understand these instructions. Will watch your condition. Will get help right away if you are not doing well or get worse.  Thank you for choosing an e-visit.  Your e-visit answers were reviewed by a board certified advanced clinical practitioner to complete your personal care plan. Depending upon the condition, your plan could have included both over the counter or prescription medications.  Please review your pharmacy choice. Make sure the pharmacy is open so you can pick up prescription now. If there is a problem, you may contact your provider through [employer] and have the prescription routed to another pharmacy.  Your safety is important to [place]. If you have drug allergies check your prescription carefully.   For the next 24 hours you can use MyChart to ask questions about today's visit, request a non-urgent call back, or ask for a work or school excuse. You will get an email in the next two days asking about your experience. I hope that your e-visit has been valuable and will speed your recovery.

## 2024-02-13 NOTE — Progress Notes (Signed)
 I have spent 5 minutes in review of e-visit questionnaire, review and updating patient chart, medical decision making and response to patient.   Claiborne Rigg, NP

## 2024-02-18 ENCOUNTER — Other Ambulatory Visit: Payer: Self-pay

## 2024-02-18 ENCOUNTER — Other Ambulatory Visit: Payer: Self-pay | Admitting: Pharmacy Technician

## 2024-02-18 DIAGNOSIS — F320 Major depressive disorder, single episode, mild: Secondary | ICD-10-CM | POA: Diagnosis not present

## 2024-02-18 NOTE — Progress Notes (Signed)
 Specialty Pharmacy Refill Coordination Note  Walter [name] is a 48 y.o. male contacted today regarding refills of specialty medication(s) Bictegravir-Emtricitab-Tenofov Musician)   Patient requested (Patient-Rptd) Delivery   Delivery date: (Patient-Rptd) 02/24/24   Verified address: (Patient-Rptd) [address] [place] [address]   Medication will be filled on 02/23/24.

## 2024-02-23 ENCOUNTER — Telehealth: Payer: Self-pay

## 2024-02-23 ENCOUNTER — Encounter

## 2024-02-23 NOTE — Telephone Encounter (Signed)
 NO SHOW.  Left message that the patient needed to call back by end of day or colonoscopy and pre visit would be cancelled.

## 2024-02-24 ENCOUNTER — Encounter: Payer: Self-pay | Admitting: Family Medicine

## 2024-02-24 ENCOUNTER — Other Ambulatory Visit (HOSPITAL_COMMUNITY): Payer: Self-pay

## 2024-02-24 ENCOUNTER — Ambulatory Visit: Attending: Family Medicine | Admitting: Family Medicine

## 2024-02-24 VITALS — BP 136/82 | HR 65 | Ht 69.0 in | Wt 225.2 lb

## 2024-02-24 DIAGNOSIS — E7800 Pure hypercholesterolemia, unspecified: Secondary | ICD-10-CM

## 2024-02-24 DIAGNOSIS — E66811 Obesity, class 1: Secondary | ICD-10-CM | POA: Diagnosis not present

## 2024-02-24 DIAGNOSIS — E785 Hyperlipidemia, unspecified: Secondary | ICD-10-CM

## 2024-02-24 DIAGNOSIS — E6609 Other obesity due to excess calories: Secondary | ICD-10-CM

## 2024-02-24 DIAGNOSIS — B20 Human immunodeficiency virus [HIV] disease: Secondary | ICD-10-CM

## 2024-02-24 DIAGNOSIS — I10 Essential (primary) hypertension: Secondary | ICD-10-CM

## 2024-02-24 DIAGNOSIS — J302 Other seasonal allergic rhinitis: Secondary | ICD-10-CM | POA: Diagnosis not present

## 2024-02-24 DIAGNOSIS — Z6833 Body mass index (BMI) 33.0-33.9, adult: Secondary | ICD-10-CM

## 2024-02-24 DIAGNOSIS — Z23 Encounter for immunization: Secondary | ICD-10-CM

## 2024-02-24 MED ORDER — LEVOCETIRIZINE DIHYDROCHLORIDE 5 MG PO TABS
5.0000 mg | ORAL_TABLET | Freq: Every evening | ORAL | 1 refills | Status: DC
  Filled 2024-02-24: qty 90, 90d supply, fill #0

## 2024-02-24 NOTE — Telephone Encounter (Signed)
 Patient No Showed pre visit. No return call, did not reschedule pre visit so colonoscopy on 03/10/24 has been cancelled and no show letter has been sent in MyChart and in the mail to home address.

## 2024-02-24 NOTE — Patient Instructions (Addendum)
 VISIT SUMMARY:  During your visit, we discussed your recent sinus infection, weight management progress, HIV management, cholesterol medication, seasonal allergies, and upcoming medical procedures. You have made significant progress in your weight management program and your sinus infection has improved after antibiotic treatment.  YOUR PLAN:  -SINUSITIS: Sinusitis is an infection or inflammation of the sinuses. Your symptoms have improved after completing a course of antibiotics.  -CLASS 1 OBESITY: Class 1 Obesity means having a body mass index (BMI) between 30 and 34.9. You have lost 10 pounds in six weeks through a weight management program and are taking phentermine. Continue taking phentermine as prescribed, monitor your blood pressure and heart rate regularly, and follow up with the weight management clinic on March 13, 2024.  -HIV: HIV is a virus that attacks the immune system. You are managing it well with Biktarvy and should continue taking it as prescribed. Maintain your annual follow-up with your infectious disease specialist.  -HYPERLIPIDEMIA: Hyperlipidemia means having high levels of fats in the blood. You are taking atorvastatin to lower your risk of stroke. Continue taking atorvastatin as prescribed.  -SEASONAL ALLERGIES: Seasonal allergies are allergic reactions that occur at certain times of the year. You are currently using Flonase and will now also take Xyzal daily to help manage your symptoms.  -COLON CANCER SCREENING: You have a colonoscopy scheduled for March 10, 2024, to screen for colon cancer.  -PNEUMONIA VACCINATION: You need a pneumonia vaccine to protect against pneumonia. This will be administered before other vaccinations.  INSTRUCTIONS:  Please follow up with the weight management clinic on March 13, 2024. Your colonoscopy is scheduled for March 10, 2024. Schedule your annual physical examination and contact the clinic if you have any new concerns or issues.

## 2024-02-24 NOTE — Addendum Note (Signed)
 Addended by: Ronette Deter on: 02/24/2024 10:38 AM   Modules accepted: Orders

## 2024-02-24 NOTE — Progress Notes (Signed)
 Subjective:  Patient ID: Walter [name], male    DOB: Dec 03, 1975  Age: 48 y.o. MRN: [id number]  CC: Medical Management of Chronic Issues     Discussed the use of AI scribe software for clinical note transcription with the patient, who gave verbal consent to proceed.  History of Present Illness The patient, with a history of HIV managed with Biktarvy,hypertension, prediabetes  presents with a recent sinus infection, for which he completed a course of antibiotics. He reports a history of allergies and is currently using Flonase, which provides some relief. He also reports a recent issue with his ear feeling clogged, which was severe enough to prompt two urgent care visits. The ear issue has since resolved with antibiotic treatment. 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  His hypertension is diet controlled  The patient has been actively managing his weight and has lost approximately 22 pounds since starting a medical weight management program in January. He is currently taking phentermine, which was started in February, and has been attending the gym regularly. He reports no cardiovascular side effects from the phentermine.  The patient is also on Atorvastatin, prescribed by his specialist, Dr. Daiva Eves, to lower the risk of stroke.  Initially had elevated LDL but this has normalized on recent labs.  He is scheduled for a colonoscopy later this month and is due for a pneumonia vaccine.    Past Medical History:  Diagnosis Date   Acute esophagitis 10/08/2015   AIDS (HCC) 10/08/2015   COVID-19 virus infection 12/19/2019   Essential hypertension    Hyperglycemia 11/30/2017   Hyperlipidemia 12/14/2022   Prediabetes 02/2019   Screen for STD (sexually transmitted disease) 01/23/2016   Seasonal allergies    Vaccine counseling 10/08/2023   Weight gain 12/14/2018    No past surgical history  on file.  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  Tobacco Use   Smoking status: Never   Smokeless tobacco: Never  Vaping Use   Vaping status: Never Used  Substance and Sexual Activity   Alcohol use: Yes    Comment: occ   Drug use: No   Sexual activity: Not Currently    Partners: Male    Comment: declined condoms  Other Topics Concern   Not on file  Social History Narrative   Not on file   Social Drivers of Health   Financial Resource Strain: Medium Risk (02/22/2024)   Overall Financial Resource Strain (CARDIA)    Difficulty of Paying Living Expenses: Somewhat hard  Food Insecurity: Food Insecurity Present (02/22/2024)   Hunger Vital Sign    Worried About Running Out of Food in the Last Year: Never true    Ran Out of Food in the  Last Year: Sometimes true  Transportation Needs: No Transportation Needs (02/22/2024)   PRAPARE - Administrator, Civil Service (Medical): No    Lack of Transportation (Non-Medical): No  Physical Activity: Sufficiently Active (02/22/2024)   Exercise Vital Sign    Days of Exercise per Week: 6 days    Minutes of Exercise per Session: 60 min  Stress: No Stress Concern Present (02/22/2024)   [employer] of Occupational Health - Occupational Stress Questionnaire    Feeling of Stress : Not at all  Social Connections: Moderately Integrated (02/22/2024)   Social Connection and Isolation Panel  [NHANES]    Frequency of Communication with Friends and Family: More than three times a week    Frequency of Social Gatherings with Friends and Family: More than three times a week    Attends Religious Services: More than 4 times per year    Active Member of [employer] or Organizations: Yes    Attends Engineer, structural: More than 4 times per year    Marital Status: Never married    No Known Allergies  Outpatient Medications Prior to Visit  Medication Sig Dispense Refill   atorvastatin (LIPITOR) 40 MG tablet Take 1 tablet (40 mg total) by mouth daily. 30 tablet 11   bictegravir-emtricitabine-tenofovir AF (BIKTARVY) 50-200-25 MG TABS tablet Take 1 tablet by mouth daily. 30 tablet 11   fluticasone (FLONASE) 50 MCG/ACT nasal spray Place 2 sprays into both nostrils daily. 16 g 6   phentermine (ADIPEX-P) 37.5 MG tablet Take 1 tablet (37.5 mg total) by mouth daily before breakfast. 30 tablet 0   No facility-administered medications prior to visit.     ROS Review of Systems  Constitutional:  Negative for activity change and appetite change.  HENT:  Positive for sneezing. Negative for sinus pressure and sore throat.   Respiratory:  Negative for chest tightness, shortness of breath and wheezing.   Cardiovascular:  Negative for chest pain and palpitations.  Gastrointestinal:  Negative for abdominal distention, abdominal pain and constipation.  Genitourinary: Negative.   Musculoskeletal: Negative.   Psychiatric/Behavioral:  Negative for behavioral problems and dysphoric mood.     Objective:  BP 136/82   Pulse 65   Ht 5\' 9"  (1.753 m)   Wt 225 lb 4 oz (102.2 kg)   SpO2 100%   BMI 33.26 kg/m      02/24/2024   10:03 AM 02/09/2024   12:00 PM 01/12/2024   10:00 AM  BP/Weight  Systolic BP 136 128 130  Diastolic BP 82 79 80  Wt. (Lbs) 225.25 224 236  BMI 33.26 kg/m2 33.08 kg/m2 34.85 kg/m2   Wt Readings from Last 3 Encounters:  02/24/24 225 lb 4 oz (102.2 kg)  02/09/24 224 lb  (101.6 kg)  01/12/24 236 lb (107 kg)      Physical Exam Constitutional:      Appearance: He is well-developed.  HENT:     Right Ear: Tympanic membrane normal.     Left Ear: Tympanic membrane normal.     Mouth/Throat:     Mouth: Mucous membranes are moist.  Cardiovascular:     Rate and Rhythm: Normal rate.     Heart sounds: Normal heart sounds. No murmur heard. Pulmonary:     Effort: Pulmonary effort is normal.     Breath sounds: Normal breath sounds. No wheezing or rales.  Chest:     Chest wall: No tenderness.  Abdominal:     General: Bowel sounds are normal. There is  no distension.     Palpations: Abdomen is soft. There is no mass.     Tenderness: There is no abdominal tenderness.  Musculoskeletal:        General: Normal range of motion.     Right lower leg: No edema.     Left lower leg: No edema.  Neurological:     Mental Status: He is alert and oriented to person, place, and time.  Psychiatric:        Mood and Affect: Mood normal.        Latest Ref Rng & Units 09/28/2023    8:30 AM 08/04/2023   10:14 AM 12/01/2022    8:47 AM  CMP  Glucose 65 - 99 mg/dL 784  696  295   BUN 7 - 25 mg/dL 12  10  10    Creatinine 0.60 - 1.29 mg/dL 2.84  1.32  4.40   Sodium 135 - 146 mmol/L 141  139  140   Potassium 3.5 - 5.3 mmol/L 4.0  4.5  4.1   Chloride 98 - 110 mmol/L 106  103  105   CO2 20 - 32 mmol/L 27  23  27    Calcium 8.6 - 10.3 mg/dL 9.4  9.2  9.1   Total Protein 6.1 - 8.1 g/dL 7.3  7.0  7.2   Total Bilirubin 0.2 - 1.2 mg/dL 0.7  0.5  0.8   Alkaline Phos 44 - 121 IU/L  90    AST 10 - 40 U/L 36  36  34   ALT 9 - 46 U/L 38  34  30     Lipid Panel     Component Value Date/Time   CHOL 136 09/28/2023 0830   CHOL 120 08/04/2023 1014   TRIG 58 09/28/2023 0830   HDL 62 09/28/2023 0830   HDL 51 08/04/2023 1014   CHOLHDL 2.2 09/28/2023 0830   VLDL 20 05/11/2017 1032   LDLCALC 61 09/28/2023 0830    CBC    Component Value Date/Time   WBC 4.0 09/28/2023 0830   RBC  4.82 09/28/2023 0830   HGB 15.9 09/28/2023 0830   HGB 15.7 08/04/2023 1014   HCT 47.5 09/28/2023 0830   HCT 47.0 08/04/2023 1014   PLT 244 09/28/2023 0830   PLT 239 08/04/2023 1014   MCV 98.5 09/28/2023 0830   MCV 97 08/04/2023 1014   MCH 33.0 09/28/2023 0830   MCHC 33.5 09/28/2023 0830   RDW 12.7 09/28/2023 0830   RDW 13.0 08/04/2023 1014   LYMPHSABS 1.3 08/04/2023 1014   MONOABS 336 05/11/2017 1032   EOSABS 52 09/28/2023 0830   EOSABS 0.0 08/04/2023 1014   BASOSABS 52 09/28/2023 0830   BASOSABS 0.1 08/04/2023 1014    Lab Results  Component Value Date   HGBA1C 5.6 08/04/2023   The 10-year ASCVD risk score (Arnett DK, et al., 2019) is: 4.3%   Values used to calculate the score:     Age: 50 years     Sex: Male     Is Non-Hispanic African American: Yes     Diabetic: No     Tobacco smoker: No     Systolic Blood Pressure: 136 mmHg     Is BP treated: No     HDL Cholesterol: 62 mg/dL     Total Cholesterol: 136 mg/dL      Assessment & Plan Sinusitis Severe sinus infection with improved symptoms post-antibiotic treatment.  Class 1 Obesity Engaged in weight management program with 10-pound loss  in six weeks. On phentermine as prescribed by the medical weight management clinic, monitored for cardiovascular side effects. Aware of potential side effects. - Continue phentermine as prescribed. - Monitor blood pressure and heart rate regularly. - Follow up with weight management clinic on March 13, 2024.  HIV On Biktarvy with no new concerns. Regular follow-ups with infectious disease specialist. - Continue Biktarvy as prescribed. - Maintain annual follow-up with infectious disease specialist.  Hyperlipidemia On atorvastatin to manage hyperlipidemia and reduce stroke risk. - Continue atorvastatin as prescribed.  Seasonal Allergies Significant symptoms managed with Flonase. Additional medication recommended. - Prescribe Xyzal to be taken daily in addition to  Flonase.  Colon Cancer Screening Colonoscopy scheduled for March 10, 2024.  Pneumonia Vaccination - Administer pneumonia vaccine.  Follow-up Advised to maintain regular follow-ups and contact clinic for new concerns. - Schedule annual physical examination. - Contact clinic for any new concerns or issues.      No orders of the defined types were placed in this encounter.   Follow-up: No follow-ups on file.       Hoy Register, MD, FAAFP. [facility] [place], [place] [phone]   02/24/2024, 10:13 AM

## 2024-03-02 ENCOUNTER — Ambulatory Visit (INDEPENDENT_AMBULATORY_CARE_PROVIDER_SITE_OTHER): Admitting: Internal Medicine

## 2024-03-02 ENCOUNTER — Encounter (INDEPENDENT_AMBULATORY_CARE_PROVIDER_SITE_OTHER): Payer: Self-pay | Admitting: Internal Medicine

## 2024-03-02 ENCOUNTER — Other Ambulatory Visit (HOSPITAL_COMMUNITY): Payer: Self-pay

## 2024-03-02 VITALS — BP 112/80 | HR 77 | Temp 97.8°F | Ht 69.0 in | Wt 217.0 lb

## 2024-03-02 DIAGNOSIS — G479 Sleep disorder, unspecified: Secondary | ICD-10-CM | POA: Diagnosis not present

## 2024-03-02 DIAGNOSIS — I10 Essential (primary) hypertension: Secondary | ICD-10-CM

## 2024-03-02 DIAGNOSIS — E66812 Obesity, class 2: Secondary | ICD-10-CM | POA: Diagnosis not present

## 2024-03-02 DIAGNOSIS — R7303 Prediabetes: Secondary | ICD-10-CM | POA: Diagnosis not present

## 2024-03-02 DIAGNOSIS — Z6835 Body mass index (BMI) 35.0-35.9, adult: Secondary | ICD-10-CM

## 2024-03-02 MED ORDER — PHENTERMINE HCL 37.5 MG PO TABS
37.5000 mg | ORAL_TABLET | Freq: Every day | ORAL | 0 refills | Status: DC
  Filled 2024-03-02 – 2024-03-08 (×3): qty 30, 30d supply, fill #0

## 2024-03-02 NOTE — Progress Notes (Signed)
 Office: [phone]  /  Fax: [phone]  Weight Summary And Biometrics  Vitals Temp: 97.8 F (36.6 C) BP: 112/80 Pulse Rate: 77 SpO2: 99 %   Anthropometric Measurements Height: 5\' 9"  (1.753 m) Weight: 217 lb (98.4 kg) BMI (Calculated): 32.03 Weight at Last Visit: 224 lb Weight Lost Since Last Visit: 7 lb Weight Gained Since Last Visit: 0 lb Starting Weight: 242 lb Total Weight Loss (lbs): 26 lb (11.8 kg) Peak Weight: 280 lb   Body Composition  Body Fat %: 26.6 % Fat Mass (lbs): 57.8 lbs Muscle Mass (lbs): 152 lbs Total Body Water (lbs): 106.6 lbs Visceral Fat Rating : 13    RMR: 2022  Today's Visit #: 5  Starting Date: 12/08/22   Subjective   Chief Complaint: Obesity  Interval History Discussed the use of AI scribe software for clinical note transcription with the patient, who gave verbal consent to proceed.  History of Present Illness Walter [name] is a 48 year old male with hypertension, HIV, prediabetes, and hyperlipidemia who presents for medical weight management.  He has lost seven pounds since his last visit and follows a category three meal plan about ninety percent of the time. He tracks calories, eats more whole foods, gets the recommended amount of protein, maintains adequate hydration, and does not skip meals. He exercises five days a week for about sixty minutes, doing cardio and yoga. His BMI has decreased from 35 to 32, and he has lost a total of 26 pounds since starting a program in January. His body fat percentage has decreased to 26%, with reductions in subcutaneous and visceral fat, while preserving muscle mass. No issues with appetite, but skipping lunch makes him more hungry by dinner. He occasionally finds it hard to have lunch due to being swamped at work, but he brings a protein shake as a backup. No palpitations. Initially experienced a lack of appetite and headaches when starting a new medication, but these symptoms have  resolved.  Regarding his HIV, he has an undetectable viral load and stable CD4 count. He is on antiretroviral therapy and visits his primary care physician once a year for monitoring. No recent blood work, but his primary care physician did not perform any as it was recently done elsewhere.  He has a history of hypertension, which is currently well-controlled. His blood pressure and heart rate are stable. No issues with palpitations.  He has prediabetes, and his weight loss and reduction in visceral fat are positive indicators for managing this condition.  He has a history of hyperlipidemia. The reduction in body fat and weight loss are beneficial for managing this condition.  He reports inadequate sleep, averaging six hours per night, and identifies as a night owl. He does not have trouble staying asleep but struggles with going to bed early.    Challenges affecting patient progress: none.    Pharmacotherapy for weight management: He is currently taking Phentermine (longterm use, single agent)  with adequate clinical response  and without side effects..   Assessment and Plan   Treatment Plan For Obesity:  Recommended Dietary Goals  Walter [name] is currently in the action stage of change. As such, his goal is to continue weight management plan. He has agreed to: continue current plan  Behavioral Health and Counseling  We discussed the following behavioral modification strategies today: continue to work on maintaining a reduced calorie state, getting the recommended amount of protein, incorporating whole foods, making healthy choices, staying well hydrated and practicing mindfulness  when eating..  Additional education and resources provided today: Handout on tips for a better sleep  Recommended Physical Activity Goals  Walter [name] has been advised to work up to 150 minutes of moderate intensity aerobic activity a week and strengthening exercises 2-3 times per week for cardiovascular health,  weight loss maintenance and preservation of muscle mass.   He has agreed to :  Start strengthening exercises with a goal of 2-3 sessions a week   Pharmacotherapy  We discussed various medication options to help Walter [name] with his weight loss efforts and we both agreed to : adequate clinical response to current dose, continue current regimen  Associated Conditions Impacted by Obesity Treatment  Primary hypertension  Prediabetes  Class 2 severe obesity with serious comorbidity and body mass index (BMI) of 35.0 to 35.9 in adult, unspecified obesity type (HCC) -     Phentermine HCl; Take 1 tablet (37.5 mg total) by mouth daily before breakfast.  Dispense: 30 tablet; Refill: 0  Sleep difficulties    Assessment and Plan Assessment & Plan Obesity Significant progress in weight management with a 26-pound loss since January, reducing BMI from 35 to 32 and body fat percentage from 32% to 26%. Goal is to reach below 22%. Following a category three meal plan, exercising five days a week, and maintaining muscle mass while reducing subcutaneous and visceral fat. Emphasized the importance of not skipping meals to prevent malnutrition and muscle loss. Encouraged to incorporate strength training to prevent muscle loss and skin sagging. - Continue category three meal plan - Continue exercise regimen with cardio and yoga - Incorporate strength training three times a week - Schedule a session with a fitness instructor at [employer] - Ensure adequate protein intake and hydration - Avoid skipping meals  Prediabetes Reduced visceral fat from 17 to 13, with a goal to be under 10, beneficial for managing prediabetes. Emphasized the importance of maintaining a healthy diet and exercise regimen to prevent progression to diabetes.  Hypertension Blood pressure is well-controlled with current medication. No issues with palpitations reported. Advised to monitor for potential interactions with current  medications and supplements.  Hyperlipidemia Weight loss and dietary changes likely contributing to improved lipid levels, although specific lipid panel results were not discussed.  HIV HIV is well-managed with an undetectable viral load and stable CD4 count. On antiretroviral therapy and follows up with primary care physician annually. Discussed potential interactions between HIV medications and supplements.  Sleep Hygiene Reports inadequate sleep, averaging six to six and a half hours per night. Difficulty going to bed early. Discussed the importance of sleep hygiene and its impact on weight and overall health. Recommended exploring sleep hygiene resources and potential supplements to improve sleep quality. - Visit [employer]leepfoundation.org for information on sleep hygiene - Consider trying Sleepytime tea with valerian root - Consider magnesium glycinate or melatonin extended release (3 mg) for sleep - Monitor for interactions with HIV medications  Follow-up Scheduled for a follow-up appointment in four weeks. - Follow up in four weeks      Objective   Physical Exam:  Blood pressure 112/80, pulse 77, temperature 97.8 F (36.6 C), height 5\' 9"  (1.753 m), weight 217 lb (98.4 kg), SpO2 99%. Body mass index is 32.05 kg/m.  General: He is overweight, cooperative, alert, well developed, and in no acute distress. PSYCH: Has normal mood, affect and thought process.   HEENT: EOMI, sclerae are anicteric. Lungs: Normal breathing effort, no conversational dyspnea. Extremities: No edema.  Neurologic: No gross sensory or  motor deficits. No tremors or fasciculations noted.    Diagnostic Data Reviewed:  BMET    Component Value Date/Time   NA 141 09/28/2023 0830   NA 139 08/04/2023 1014   K 4.0 09/28/2023 0830   CL 106 09/28/2023 0830   CO2 27 09/28/2023 0830   GLUCOSE 112 (H) 09/28/2023 0830   BUN 12 09/28/2023 0830   BUN 10 08/04/2023 1014   CREATININE 1.13 09/28/2023 0830    CALCIUM 9.4 09/28/2023 0830   GFRNONAA 73 05/29/2021 0930   GFRAA 85 05/29/2021 0930   Lab Results  Component Value Date   HGBA1C 5.6 08/04/2023   HGBA1C 5.8 (H) 11/30/2017   Lab Results  Component Value Date   INSULIN 8.3 12/09/2023   Lab Results  Component Value Date   TSH 1.470 12/09/2023   CBC    Component Value Date/Time   WBC 4.0 09/28/2023 0830   RBC 4.82 09/28/2023 0830   HGB 15.9 09/28/2023 0830   HGB 15.7 08/04/2023 1014   HCT 47.5 09/28/2023 0830   HCT 47.0 08/04/2023 1014   PLT 244 09/28/2023 0830   PLT 239 08/04/2023 1014   MCV 98.5 09/28/2023 0830   MCV 97 08/04/2023 1014   MCH 33.0 09/28/2023 0830   MCHC 33.5 09/28/2023 0830   RDW 12.7 09/28/2023 0830   RDW 13.0 08/04/2023 1014   Iron Studies No results found for: "IRON", "TIBC", "FERRITIN", "IRONPCTSAT" Lipid Panel     Component Value Date/Time   CHOL 136 09/28/2023 0830   CHOL 120 08/04/2023 1014   TRIG 58 09/28/2023 0830   HDL 62 09/28/2023 0830   HDL 51 08/04/2023 1014   CHOLHDL 2.2 09/28/2023 0830   VLDL 20 05/11/2017 1032   LDLCALC 61 09/28/2023 0830   Hepatic Function Panel     Component Value Date/Time   PROT 7.3 09/28/2023 0830   PROT 7.0 08/04/2023 1014   ALBUMIN 4.4 08/04/2023 1014   AST 36 09/28/2023 0830   ALT 38 09/28/2023 0830   ALKPHOS 90 08/04/2023 1014   BILITOT 0.7 09/28/2023 0830   BILITOT 0.5 08/04/2023 1014      Component Value Date/Time   TSH 1.470 12/09/2023 0910   Nutritional Lab Results  Component Value Date   VD25OH 31.5 12/09/2023    Medications: Outpatient Encounter Medications as of 03/02/2024  Medication Sig   atorvastatin (LIPITOR) 40 MG tablet Take 1 tablet (40 mg total) by mouth daily.   bictegravir-emtricitabine-tenofovir AF (BIKTARVY) 50-200-25 MG TABS tablet Take 1 tablet by mouth daily.   fluticasone (FLONASE) 50 MCG/ACT nasal spray Place 2 sprays into both nostrils daily.   levocetirizine (XYZAL ALLERGY 24HR) 5 MG tablet Take 1 tablet (5  mg total) by mouth every evening.   [DISCONTINUED] phentermine (ADIPEX-P) 37.5 MG tablet Take 1 tablet (37.5 mg total) by mouth daily before breakfast.   phentermine (ADIPEX-P) 37.5 MG tablet Take 1 tablet (37.5 mg total) by mouth daily before breakfast.   No facility-administered encounter medications on file as of 03/02/2024.     Follow-Up   Return in about 4 weeks (around 03/30/2024) for For Weight Mangement with Dr. Rikki Spearing.Marland Kitchen He was informed of the importance of frequent follow up visits to maximize his success with intensive lifestyle modifications for his multiple health conditions.  Attestation Statement   Reviewed by clinician on day of visit: allergies, medications, problem list, medical history, surgical history, family history, social history, and previous encounter notes.     Worthy Rancher, MD

## 2024-03-08 ENCOUNTER — Encounter: Admitting: Pediatrics

## 2024-03-08 ENCOUNTER — Other Ambulatory Visit (HOSPITAL_COMMUNITY): Payer: Self-pay

## 2024-03-10 ENCOUNTER — Encounter: Admitting: Pediatrics

## 2024-03-14 ENCOUNTER — Other Ambulatory Visit: Payer: Self-pay

## 2024-03-14 ENCOUNTER — Other Ambulatory Visit: Payer: Self-pay | Admitting: Pharmacy Technician

## 2024-03-14 ENCOUNTER — Ambulatory Visit (INDEPENDENT_AMBULATORY_CARE_PROVIDER_SITE_OTHER): Admitting: Internal Medicine

## 2024-03-14 NOTE — Progress Notes (Signed)
 Specialty Pharmacy Refill Coordination Note  Walter [name] is a 48 y.o. male contacted today regarding refills of specialty medication(s) Bictegravir-Emtricitab-Tenofov (Biktarvy )   Patient requested (Patient-Rptd) Delivery   Delivery date: (Patient-Rptd) 03/18/24   Verified address: (Patient-Rptd) [address] [place] [address]   Medication will be filled on 03/17/24.

## 2024-03-15 ENCOUNTER — Ambulatory Visit (INDEPENDENT_AMBULATORY_CARE_PROVIDER_SITE_OTHER): Admitting: Internal Medicine

## 2024-03-17 ENCOUNTER — Other Ambulatory Visit: Payer: Self-pay

## 2024-03-17 NOTE — Progress Notes (Signed)
 Ship 04/28, RTSoon - left vm

## 2024-03-18 ENCOUNTER — Other Ambulatory Visit: Payer: Self-pay

## 2024-03-30 ENCOUNTER — Other Ambulatory Visit: Payer: Self-pay

## 2024-03-30 ENCOUNTER — Other Ambulatory Visit (HOSPITAL_COMMUNITY): Payer: Self-pay

## 2024-03-30 ENCOUNTER — Encounter: Payer: Self-pay | Admitting: Pharmacist

## 2024-03-31 ENCOUNTER — Ambulatory Visit (INDEPENDENT_AMBULATORY_CARE_PROVIDER_SITE_OTHER): Admitting: Family Medicine

## 2024-03-31 ENCOUNTER — Other Ambulatory Visit (HOSPITAL_COMMUNITY): Payer: Self-pay

## 2024-03-31 ENCOUNTER — Other Ambulatory Visit: Payer: Self-pay

## 2024-03-31 ENCOUNTER — Encounter (INDEPENDENT_AMBULATORY_CARE_PROVIDER_SITE_OTHER): Payer: Self-pay | Admitting: Family Medicine

## 2024-03-31 ENCOUNTER — Other Ambulatory Visit (HOSPITAL_BASED_OUTPATIENT_CLINIC_OR_DEPARTMENT_OTHER): Payer: Self-pay

## 2024-03-31 VITALS — BP 124/84 | HR 66 | Temp 97.8°F | Ht 69.0 in | Wt 218.0 lb

## 2024-03-31 DIAGNOSIS — G479 Sleep disorder, unspecified: Secondary | ICD-10-CM | POA: Diagnosis not present

## 2024-03-31 DIAGNOSIS — E66812 Obesity, class 2: Secondary | ICD-10-CM | POA: Diagnosis not present

## 2024-03-31 DIAGNOSIS — Z6832 Body mass index (BMI) 32.0-32.9, adult: Secondary | ICD-10-CM | POA: Diagnosis not present

## 2024-03-31 DIAGNOSIS — I10 Essential (primary) hypertension: Secondary | ICD-10-CM

## 2024-03-31 MED ORDER — PHENTERMINE HCL 37.5 MG PO TABS
37.5000 mg | ORAL_TABLET | Freq: Every day | ORAL | 0 refills | Status: DC
Start: 2024-03-31 — End: 2024-04-26
  Filled 2024-03-31 – 2024-04-07 (×3): qty 30, 30d supply, fill #0

## 2024-03-31 NOTE — Progress Notes (Signed)
 SUBJECTIVE:  Chief Complaint: Obesity  Interim History: Patient normally sees Dr. Allie Area and here for follow up today.  Denies huger and is feeling that getting all the food in has been easier than it was previously. Getting in around 10oz at supper.  For snacks he is doing cottage cheese, yogurt, protein bar, nuts, carrots, peanut butter. Does miss pasta and potatoes.  He is going to the beach next week and then otherwise just has work.   Walter [name] is here to discuss his progress with his obesity treatment plan. He is on the Category 3 Plan and states he is following his eating plan approximately 80 % of the time. He states he is exercising 60 minutes 5 times per week.   OBJECTIVE: Visit Diagnoses: Problem List Items Addressed This Visit       Cardiovascular and Mediastinum   HTN (hypertension) - Primary   Blood pressure well controlled today. No chest pain, chest pressure or headache.  Can continue to monitor at follow up appointments.        Other   Class 2 severe obesity with serious comorbidity and body mass index (BMI) of 35.0 to 35.9 in adult [facility])   Anthropometric Measurements Height: 5\' 9"  (1.753 m) Weight: 218 lb (98.9 kg) BMI (Calculated): 32.18 Weight at Last Visit: 217 lb Weight Lost Since Last Visit: 0 Weight Gained Since Last Visit: 1 Starting Weight: 242 lb Total Weight Loss (lbs): 24 lb (10.9 kg) Peak Weight: 280 Body Composition  Body Fat %: 26.2 % Fat Mass (lbs): 57.2 lbs Muscle Mass (lbs): 153.4 lbs Total Body Water (lbs): 110 lbs Visceral Fat Rating : 13 Other Clinical Data Today's Visit #: 6 Starting Date: 12/08/22 Comments: Cat 3       Relevant Medications   phentermine  (ADIPEX-P ) 37.5 MG tablet   Sleep difficulties   Discussed sleep hygiene today and sleep handout was given.  Patient voices he will work on implementing some changes to his sleep habits to see if this helps with sleep quantity and quality.  Will need to discuss further at  next appointment with Dr. Allie Area to see if implemented changes worked.      Other Visit Diagnoses       BMI 32.0-32.9,adult           No data recorded       03/31/2024   11:00 AM 03/02/2024    7:00 AM 02/24/2024   10:03 AM  Vitals with BMI  Height 5\' 9"  5\' 9"  5\' 9"   Weight 218 lbs 217 lbs 225 lbs 4 oz  BMI 32.18 32.03 33.25  Systolic 124 112 409  Diastolic 84 80 82  Pulse 66 77 65      ASSESSMENT AND PLAN:  Diet: Walter [name] is currently in the action stage of change. As such, his goal is to continue with weight loss efforts and has agreed to the Category 3 Plan.   Exercise:  For substantial health benefits, adults should do at least 150 minutes (2 hours and 30 minutes) a week of moderate-intensity, or 75 minutes (1 hour and 15 minutes) a week of vigorous-intensity aerobic physical activity, or an equivalent combination of moderate- and vigorous-intensity aerobic activity. Aerobic activity should be performed in episodes of at least 10 minutes, and preferably, it should be spread throughout the week.  Behavior Modification:  We discussed the following Behavioral Modification Strategies today: increasing lean protein intake, decreasing simple carbohydrates, increasing vegetables, meal planning and cooking strategies, keeping healthy foods in  the home, and planning for success. We discussed various medication options to help Walter [name] with his weight loss efforts and we both agreed to continue phenterine at current dose- no side effects noted.  Return in about 4 weeks (around 04/28/2024).   He was informed of the importance of frequent follow up visits to maximize his success with intensive lifestyle modifications for his multiple health conditions.  Attestation Statements:   Reviewed by clinician on day of visit: allergies, medications, problem list, medical history, surgical history, family history, social history, and previous encounter notes.     Donaciano Frizzle, MD

## 2024-04-07 ENCOUNTER — Other Ambulatory Visit (HOSPITAL_COMMUNITY): Payer: Self-pay

## 2024-04-10 DIAGNOSIS — B20 Human immunodeficiency virus [HIV] disease: Secondary | ICD-10-CM | POA: Diagnosis not present

## 2024-04-10 DIAGNOSIS — Z79899 Other long term (current) drug therapy: Secondary | ICD-10-CM | POA: Diagnosis not present

## 2024-04-10 DIAGNOSIS — Z833 Family history of diabetes mellitus: Secondary | ICD-10-CM | POA: Diagnosis not present

## 2024-04-10 DIAGNOSIS — E785 Hyperlipidemia, unspecified: Secondary | ICD-10-CM | POA: Diagnosis not present

## 2024-04-10 DIAGNOSIS — N182 Chronic kidney disease, stage 2 (mild): Secondary | ICD-10-CM | POA: Diagnosis not present

## 2024-04-10 DIAGNOSIS — I129 Hypertensive chronic kidney disease with stage 1 through stage 4 chronic kidney disease, or unspecified chronic kidney disease: Secondary | ICD-10-CM | POA: Diagnosis not present

## 2024-04-10 DIAGNOSIS — E669 Obesity, unspecified: Secondary | ICD-10-CM | POA: Diagnosis not present

## 2024-04-10 DIAGNOSIS — Z809 Family history of malignant neoplasm, unspecified: Secondary | ICD-10-CM | POA: Diagnosis not present

## 2024-04-10 DIAGNOSIS — Z8249 Family history of ischemic heart disease and other diseases of the circulatory system: Secondary | ICD-10-CM | POA: Diagnosis not present

## 2024-04-10 DIAGNOSIS — Z6831 Body mass index (BMI) 31.0-31.9, adult: Secondary | ICD-10-CM | POA: Diagnosis not present

## 2024-04-11 NOTE — Assessment & Plan Note (Signed)
 Anthropometric Measurements Height: 5\' 9"  (1.753 m) Weight: 218 lb (98.9 kg) BMI (Calculated): 32.18 Weight at Last Visit: 217 lb Weight Lost Since Last Visit: 0 Weight Gained Since Last Visit: 1 Starting Weight: 242 lb Total Weight Loss (lbs): 24 lb (10.9 kg) Peak Weight: 280 Body Composition  Body Fat %: 26.2 % Fat Mass (lbs): 57.2 lbs Muscle Mass (lbs): 153.4 lbs Total Body Water (lbs): 110 lbs Visceral Fat Rating : 13 Other Clinical Data Today's Visit #: 6 Starting Date: 12/08/22 Comments: Cat 3

## 2024-04-11 NOTE — Assessment & Plan Note (Signed)
 Blood pressure well controlled today. No chest pain, chest pressure or headache.  Can continue to monitor at follow up appointments.

## 2024-04-11 NOTE — Assessment & Plan Note (Signed)
 Discussed sleep hygiene today and sleep handout was given.  Patient voices he will work on implementing some changes to his sleep habits to see if this helps with sleep quantity and quality.  Will need to discuss further at next appointment with Dr. Allie Area to see if implemented changes worked.

## 2024-04-12 ENCOUNTER — Other Ambulatory Visit: Payer: Self-pay

## 2024-04-12 NOTE — Progress Notes (Signed)
 Specialty Pharmacy Refill Coordination Note  Walter [name] is a 48 y.o. male contacted today regarding refills of specialty medication(s) Bictegravir-Emtricitab-Tenofov (Biktarvy )   Patient requested (Patient-Rptd) Delivery   Delivery date: 04/15/24   Verified address: (Patient-Rptd) [address] [place] [address]   Medication will be filled on 04/14/24.

## 2024-04-26 ENCOUNTER — Encounter (INDEPENDENT_AMBULATORY_CARE_PROVIDER_SITE_OTHER): Payer: Self-pay | Admitting: Internal Medicine

## 2024-04-26 ENCOUNTER — Other Ambulatory Visit (HOSPITAL_COMMUNITY): Payer: Self-pay

## 2024-04-26 ENCOUNTER — Ambulatory Visit (INDEPENDENT_AMBULATORY_CARE_PROVIDER_SITE_OTHER): Admitting: Internal Medicine

## 2024-04-26 VITALS — BP 119/81 | HR 70 | Temp 97.7°F | Ht 69.0 in | Wt 214.0 lb

## 2024-04-26 DIAGNOSIS — R7303 Prediabetes: Secondary | ICD-10-CM | POA: Diagnosis not present

## 2024-04-26 DIAGNOSIS — E66811 Obesity, class 1: Secondary | ICD-10-CM

## 2024-04-26 DIAGNOSIS — E6609 Other obesity due to excess calories: Secondary | ICD-10-CM

## 2024-04-26 DIAGNOSIS — E66812 Obesity, class 2: Secondary | ICD-10-CM

## 2024-04-26 DIAGNOSIS — E7800 Pure hypercholesterolemia, unspecified: Secondary | ICD-10-CM | POA: Diagnosis not present

## 2024-04-26 DIAGNOSIS — I10 Essential (primary) hypertension: Secondary | ICD-10-CM

## 2024-04-26 DIAGNOSIS — Z6834 Body mass index (BMI) 34.0-34.9, adult: Secondary | ICD-10-CM

## 2024-04-26 MED ORDER — PHENTERMINE HCL 37.5 MG PO TABS
37.5000 mg | ORAL_TABLET | Freq: Every day | ORAL | 0 refills | Status: DC
  Filled 2024-04-26 – 2024-05-09 (×2): qty 30, 30d supply, fill #0

## 2024-04-26 NOTE — Progress Notes (Signed)
 Office: [phone]  /  Fax: [phone]  Weight Summary And Biometrics  Vitals Temp: 97.7 F (36.5 C) BP: 119/81 Pulse Rate: 70 SpO2: 100 %   Anthropometric Measurements Height: 5\' 9"  (1.753 m) Weight: 214 lb (97.1 kg) BMI (Calculated): 31.59 Weight at Last Visit: 218 lb Weight Lost Since Last Visit: 4 lb Weight Gained Since Last Visit: 0 Starting Weight: 242 lb Total Weight Loss (lbs): 28 lb (12.7 kg) Peak Weight: 280 lb   Body Composition  Body Fat %: 26 % Fat Mass (lbs): 55.8 lbs Muscle Mass (lbs): 151 lbs Total Body Water (lbs): 108.8 lbs Visceral Fat Rating : 13    RMR: 2022  Today's Visit #: 7  Starting Date: 12/09/23   Subjective   Chief Complaint: Obesity  Interval History Discussed the use of AI scribe software for clinical note transcription with the patient, who gave verbal consent to proceed.  History of Present Illness   Walter [name] is a 48 year old male with hypertension, prediabetes, and hyperlipidemia who presents for medical weight management.  He has been on phentermine  37.5 mg once daily since January and has lost 4 pounds since the last visit. He follows a 1500 calorie meal plan about 80% of the time, incorporating whole foods, maintaining adequate hydration, and exercising five days a week for 60 minutes, including strength training, cardio, and yoga. He reports adequate sleep and no high levels of stress.  His highest recorded weight was 280 pounds, and he has lost a total of 28 pounds, with a current body fat percentage of 26%. His weight loss goal is to reach 200 pounds, with 14 pounds remaining to achieve this target. He reports no side effects from phentermine , except for initial wooziness during the first week of use, which resolved with increased water intake. He has not experienced elevated blood pressure or heart rate while on the medication. He is not currently taking medication for blood pressure and reports that he  stopped medication after losing weight and making dietary changes.  He mentions challenges with snacking, particularly in response to stress and emotional triggers, such as recent family bereavements. He acknowledges an increase in appetite and cravings, particularly for sugary foods, despite not typically consuming sweets.       Challenges affecting patient progress: presence of obesogenic drugs and emotional eating.    Pharmacotherapy for weight management: He is currently taking Phentermine  (longterm use, single agent)  with adequate clinical response  and without side effects..   Assessment and Plan   Treatment Plan For Obesity:  Recommended Dietary Goals  Monterius is currently in the action stage of change. As such, his goal is to continue weight management plan. He has agreed to: continue current plan  Behavioral Health and Counseling  We discussed the following behavioral modification strategies today: practice mindfulness eating and understand the difference between hunger signals and cravings, avoiding temptations and identifying enticing environmental cues, continue to practice mindfulness when eating, better snacking choices, and continue to work on maintaining a reduced calorie state, getting the recommended amount of protein, incorporating whole foods, making healthy choices, staying well hydrated and practicing mindfulness when eating..  Additional education and resources provided today: Handout on emotional eating  Recommended Physical Activity Goals  Pilar has been advised to work up to 150 minutes of moderate intensity aerobic activity a week and strengthening exercises 2-3 times per week for cardiovascular health, weight loss maintenance and preservation of muscle mass.   He has agreed to :  discussed changing exercise routine to avoid adaptation plateau or training plateau  Pharmacotherapy  We discussed various medication options to help Pawan with his weight  loss efforts and we both agreed to : Adequate clinical response to anti-obesity medication, continue current regimen  Associated Conditions Impacted by Obesity Treatment  Primary hypertension  Class 1 obesity due to excess calories with serious comorbidity and body mass index (BMI) of 34.0 to 34.9 in adult  Prediabetes  Pure hypercholesterolemia     Assessment and Plan    Obesity He is undergoing medical weight management and has lost 28 pounds since starting phentermine  in January. He is currently on a 1500 calorie meal plan and exercises five days a week. His goal is to reach 200 pounds, with 14 pounds remaining to lose. He reports no significant side effects from phentermine , and his blood pressure and heart rate are well controlled. He experiences increased appetite and occasional cravings, attributed to emotional factors such as stress and boredom. Phentermine  is considered safe to continue as it has not raised his blood pressure or heart rate. Once he reaches his goal weight, calorie intake may be increased to maintain weight and potentially focus on muscle building. - Continue phentermine  37.5 mg once daily - Maintain 1500 calorie meal plan - Continue current exercise regimen - Provide handout on stress eating management - Consider increasing calorie intake to 1800-2000 calories once goal weight is achieved - Encourage keeping a journal to track snacking patterns and triggers - Discuss potential use of behavioral health specialist for emotional eating if needed  Hypertension Hypertension is well controlled with weight loss and dietary changes. He is not on any antihypertensive medications at present.  Prediabetes His last A1c was 5.5% in March of last year and 5.6% in September of last year, indicating prediabetes. He is managing this condition with lifestyle modifications, including diet and exercise.  Hyperlipidemia He is on atorvastatin  40 mg once daily, and his LDL  cholesterol was 61 at the last check. His cholesterol levels are well controlled with medication. - Continue atorvastatin  40 mg once daily        Objective   Physical Exam:  Blood pressure 119/81, pulse 70, temperature 97.7 F (36.5 C), height 5\' 9"  (1.753 m), weight 214 lb (97.1 kg), SpO2 100%. Body mass index is 31.6 kg/m.  General: He is overweight, cooperative, alert, well developed, and in no acute distress. PSYCH: Has normal mood, affect and thought process.   HEENT: EOMI, sclerae are anicteric. Lungs: Normal breathing effort, no conversational dyspnea. Extremities: No edema.  Neurologic: No gross sensory or motor deficits. No tremors or fasciculations noted.    Diagnostic Data Reviewed:  BMET    Component Value Date/Time   NA 141 09/28/2023 0830   NA 139 08/04/2023 1014   K 4.0 09/28/2023 0830   CL 106 09/28/2023 0830   CO2 27 09/28/2023 0830   GLUCOSE 112 (H) 09/28/2023 0830   BUN 12 09/28/2023 0830   BUN 10 08/04/2023 1014   CREATININE 1.13 09/28/2023 0830   CALCIUM  9.4 09/28/2023 0830   GFRNONAA 73 05/29/2021 0930   GFRAA 85 05/29/2021 0930   Lab Results  Component Value Date   HGBA1C 5.6 08/04/2023   HGBA1C 5.8 (H) 11/30/2017   Lab Results  Component Value Date   INSULIN  8.3 12/09/2023   Lab Results  Component Value Date   TSH 1.470 12/09/2023   CBC    Component Value Date/Time   WBC 4.0 09/28/2023 0830  RBC 4.82 09/28/2023 0830   HGB 15.9 09/28/2023 0830   HGB 15.7 08/04/2023 1014   HCT 47.5 09/28/2023 0830   HCT 47.0 08/04/2023 1014   PLT 244 09/28/2023 0830   PLT 239 08/04/2023 1014   MCV 98.5 09/28/2023 0830   MCV 97 08/04/2023 1014   MCH 33.0 09/28/2023 0830   MCHC 33.5 09/28/2023 0830   RDW 12.7 09/28/2023 0830   RDW 13.0 08/04/2023 1014   Iron Studies No results found for: "IRON", "TIBC", "FERRITIN", "IRONPCTSAT" Lipid Panel     Component Value Date/Time   CHOL 136 09/28/2023 0830   CHOL 120 08/04/2023 1014   TRIG 58  09/28/2023 0830   HDL 62 09/28/2023 0830   HDL 51 08/04/2023 1014   CHOLHDL 2.2 09/28/2023 0830   VLDL 20 05/11/2017 1032   LDLCALC 61 09/28/2023 0830   Hepatic Function Panel     Component Value Date/Time   PROT 7.3 09/28/2023 0830   PROT 7.0 08/04/2023 1014   ALBUMIN 4.4 08/04/2023 1014   AST 36 09/28/2023 0830   ALT 38 09/28/2023 0830   ALKPHOS 90 08/04/2023 1014   BILITOT 0.7 09/28/2023 0830   BILITOT 0.5 08/04/2023 1014      Component Value Date/Time   TSH 1.470 12/09/2023 0910   Nutritional Lab Results  Component Value Date   VD25OH 31.5 12/09/2023    Medications: Outpatient Encounter Medications as of 04/26/2024  Medication Sig   atorvastatin  (LIPITOR) 40 MG tablet Take 1 tablet (40 mg total) by mouth daily.   bictegravir-emtricitabine -tenofovir  AF (BIKTARVY ) 50-200-25 MG TABS tablet Take 1 tablet by mouth daily.   fluticasone  (FLONASE ) 50 MCG/ACT nasal spray Place 2 sprays into both nostrils daily.   levocetirizine (XYZAL  ALLERGY 24HR) 5 MG tablet Take 1 tablet (5 mg total) by mouth every evening.   phentermine  (ADIPEX-P ) 37.5 MG tablet Take 1 tablet (37.5 mg total) by mouth daily before breakfast.   No facility-administered encounter medications on file as of 04/26/2024.     Follow-Up   No follow-ups on file.Aaron Aas He was informed of the importance of frequent follow up visits to maximize his success with intensive lifestyle modifications for his multiple health conditions.  Attestation Statement   Reviewed by clinician on day of visit: allergies, medications, problem list, medical history, surgical history, family history, social history, and previous encounter notes.     Ladd Picker, MD

## 2024-04-29 ENCOUNTER — Other Ambulatory Visit: Payer: Self-pay

## 2024-05-04 ENCOUNTER — Other Ambulatory Visit: Payer: Self-pay

## 2024-05-04 NOTE — Progress Notes (Signed)
 Specialty Pharmacy Ongoing Clinical Assessment Note  Walter [name] is a 48 y.o. male who is being followed by the specialty pharmacy service for RxSp HIV   Patient's specialty medication(s) reviewed today: Bictegravir-Emtricitab-Tenofov (Biktarvy )   Missed doses in the last 4 weeks: 0   Patient/Caregiver did not have any additional questions or concerns.   Therapeutic benefit summary: Patient is achieving benefit   Adverse events/side effects summary: No adverse events/side effects   Patient's therapy is appropriate to: Continue    Goals Addressed             This Visit's Progress    Achieve Undetectable HIV Viral Load < 20   On track    Patient is on track. Patient will maintain adherence. Viral load remains undetectable long term.          Follow up: 12 months  [facility]

## 2024-05-04 NOTE — Progress Notes (Signed)
 Clinical Intervention Note  Clinical Intervention Notes: Patient reported starting healthy weight management clinic who added phentermine . No DDIs identified with Biktarvy    Clinical Intervention Outcomes: Prevention of an adverse drug event   Advertising account planner

## 2024-05-04 NOTE — Progress Notes (Signed)
 Specialty Pharmacy Refill Coordination Note  Walter [name] is a 48 y.o. male contacted today regarding refills of specialty medication(s) Bictegravir-Emtricitab-Tenofov (Biktarvy )   Patient requested Delivery   Delivery date: 05/11/24   Verified address: [address] [place] [address]   Medication will be filled on 05/10/24.

## 2024-05-09 ENCOUNTER — Other Ambulatory Visit (HOSPITAL_COMMUNITY): Payer: Self-pay

## 2024-05-24 ENCOUNTER — Ambulatory Visit (INDEPENDENT_AMBULATORY_CARE_PROVIDER_SITE_OTHER): Admitting: Internal Medicine

## 2024-05-24 ENCOUNTER — Encounter (INDEPENDENT_AMBULATORY_CARE_PROVIDER_SITE_OTHER): Payer: Self-pay | Admitting: Internal Medicine

## 2024-05-24 ENCOUNTER — Other Ambulatory Visit (HOSPITAL_COMMUNITY): Payer: Self-pay

## 2024-05-24 VITALS — BP 132/87 | HR 77 | Temp 97.5°F | Ht 69.0 in | Wt 218.0 lb

## 2024-05-24 DIAGNOSIS — E66811 Obesity, class 1: Secondary | ICD-10-CM

## 2024-05-24 DIAGNOSIS — F509 Eating disorder, unspecified: Secondary | ICD-10-CM | POA: Insufficient documentation

## 2024-05-24 DIAGNOSIS — F5089 Other specified eating disorder: Secondary | ICD-10-CM | POA: Diagnosis not present

## 2024-05-24 DIAGNOSIS — E6609 Other obesity due to excess calories: Secondary | ICD-10-CM

## 2024-05-24 DIAGNOSIS — Z6834 Body mass index (BMI) 34.0-34.9, adult: Secondary | ICD-10-CM

## 2024-05-24 MED ORDER — BUPROPION HCL ER (SR) 100 MG PO TB12
100.0000 mg | ORAL_TABLET | Freq: Every day | ORAL | 0 refills | Status: DC
  Filled 2024-05-24: qty 30, 30d supply, fill #0

## 2024-05-24 MED ORDER — PHENTERMINE HCL 37.5 MG PO TABS
37.5000 mg | ORAL_TABLET | Freq: Every day | ORAL | 0 refills | Status: DC
  Filled 2024-05-24 – 2024-06-06 (×2): qty 30, 30d supply, fill #0

## 2024-05-24 NOTE — Assessment & Plan Note (Signed)
 Experiencing challenges with weight management, having gained four pounds since the last visit. Adheres to a 1500 calorie meal plan, exercises five days a week for 60 minutes, and maintains hydration. Occasionally skips meals and has increased cravings, particularly for sweets, exacerbated by emotional triggers related to the anniversary of his grandmother's passing. Currently on the maximum allowed dose of phentermine  for long-term use, which he tolerates well without significant side effects. Topiramate is contraindicated due to interactions with HIV medications. Discussed potential use of bupropion to help with emotional eating and cravings, while emphasizing the importance of addressing psychological aspects of eating habits through counseling. Explained that rapid weight loss is not sustainable and that maintaining weight loss for one to two years is necessary for the body to adjust to a new weight set point. - Continue phentermine  at current dose - Start bupropion SR 100 mg once daily - Refer to Dr. Sharron for psychological support for emotional eating - Provide handout from the [facility] on managing cravings - Schedule follow-up appointment in four weeks

## 2024-05-24 NOTE — Assessment & Plan Note (Signed)
 Reports increased cravings for sweets, particularly during emotionally challenging times. Explained psychological and environmental factors contributing to emotional eating and the importance of restructuring the relationship with food. Suggested counseling to help manage emotional triggers and cravings, and discussed potential use of bupropion to assist with emotional eating. Explained that medications like bupropion can help with stress and cravings but require monitoring of blood pressure and heart rate due to its stimulating effects. - Monitor blood pressure and heart rate closely

## 2024-05-24 NOTE — Progress Notes (Signed)
 Office: [phone]  /  Fax: [phone]  Weight Summary and Body Composition Analysis (BIA)  Vitals Temp: (!) 97.5 F (36.4 C) BP: 132/87 Pulse Rate: 77 SpO2: 98 %   Anthropometric Measurements Height: 5' 9 (1.753 m) Weight: 218 lb (98.9 kg) BMI (Calculated): 32.18 Weight at Last Visit: 214 lb Weight Lost Since Last Visit: 0 lb Weight Gained Since Last Visit: 4 lb Starting Weight: 242 lb Total Weight Loss (lbs): 24 lb (10.9 kg) Peak Weight: 280 lb   Body Composition  Body Fat %: 26.9 % Fat Mass (lbs): 58.6 lbs Muscle Mass (lbs): 151.8 lbs Total Body Water (lbs): 114.2 lbs Visceral Fat Rating : 13    RMR: 2022  Today's Visit #: 8  Starting Date: 12/09/23   Subjective   Chief Complaint: Obesity  Interval History Discussed the use of AI scribe software for clinical note transcription with the patient, who gave verbal consent to proceed.  History of Present Illness   Walter [name] is a 48 year old male who presents for medical weight management.  Since the last visit, he has gained four pounds despite adhering to a 1500 calorie meal plan. He consumes more whole foods, gets the recommended amount of protein, and maintains adequate hydration, although he occasionally skips meals. He exercises five days a week for sixty minutes each session.  He is currently taking phentermine  for weight management. He inquired about topiramate for cravings, but it was not prescribed due to interactions with his HIV medications. Cravings have been more challenging recently, particularly as he approaches the anniversary of his grandmother's passing, who was a significant maternal figure in his life. He notes eating late due to work commitments and having access to sweets at work, which he finds difficult to resist.  No anxiety issues, and his anxiety is well-controlled. He craves sweets but can often override these cravings, although it has been more difficult lately. He does  not keep sweets at home, but has access to them at work, which he finds challenging to manage.       Challenges affecting patient progress: emotional eating.    Pharmacotherapy for weight management: He is currently taking Phentermine  (longterm use, single agent)  with adequate clinical response  and without side effects..   Assessment and Plan   Treatment Plan For Obesity:  Recommended Dietary Goals  Walter [name] is currently in the action stage of change. As such, his goal is to continue weight management plan. He has agreed to: continue current plan  Behavioral Health and Counseling  We discussed the following behavioral modification strategies today: practice mindfulness eating and understand the difference between hunger signals and cravings, work on managing stress, creating time for self-care and relaxation, avoiding temptations and identifying enticing environmental cues, and continue to practice mindfulness when eating.  Additional education and resources provided today: HO on emotional eating  Recommended Physical Activity Goals  Elim has been advised to work up to 150 minutes of moderate intensity aerobic activity a week and strengthening exercises 2-3 times per week for cardiovascular health, weight loss maintenance and preservation of muscle mass.   He has agreed to :  Continue current level of physical activity   Medical Interventions and Pharmacotherapy  We discussed various medication options to help Terrius with his weight loss efforts and we both agreed to : Adequate clinical response to anti-obesity medication, continue current regimen  Associated Conditions Impacted by Obesity Treatment  Assessment & Plan Other disorder of eating Reports increased cravings  for sweets, particularly during emotionally challenging times. Explained psychological and environmental factors contributing to emotional eating and the importance of restructuring the relationship with  food. Suggested counseling to help manage emotional triggers and cravings, and discussed potential use of bupropion to assist with emotional eating. Explained that medications like bupropion can help with stress and cravings but require monitoring of blood pressure and heart rate due to its stimulating effects. - Monitor blood pressure and heart rate closely Class 1 obesity due to excess calories with serious comorbidity and body mass index (BMI) of 34.0 to 34.9 in adult Experiencing challenges with weight management, having gained four pounds since the last visit. Adheres to a 1500 calorie meal plan, exercises five days a week for 60 minutes, and maintains hydration. Occasionally skips meals and has increased cravings, particularly for sweets, exacerbated by emotional triggers related to the anniversary of his grandmother's passing. Currently on the maximum allowed dose of phentermine  for long-term use, which he tolerates well without significant side effects. Topiramate is contraindicated due to interactions with HIV medications. Discussed potential use of bupropion to help with emotional eating and cravings, while emphasizing the importance of addressing psychological aspects of eating habits through counseling. Explained that rapid weight loss is not sustainable and that maintaining weight loss for one to two years is necessary for the body to adjust to a new weight set point. - Continue phentermine  at current dose - Start bupropion SR 100 mg once daily - Refer to Dr. Sharron for psychological support for emotional eating - Provide handout from the [facility] on managing cravings - Schedule follow-up appointment in four weeks    HIV Management On HIV medications, including tenofovir . Topiramate is contraindicated due to potential interactions with tenofovir , which could increase the risk of metabolic acidosis. - Avoid use of topiramate due to interaction with HIV medications  General Health  Maintenance Maintaining a healthy lifestyle by adhering to a calorie-controlled diet, exercising regularly, and staying hydrated. Emphasized the importance of gradual weight loss and maintaining the new weight for one to two years to allow the body to adjust. - Encourage continuation of current exercise and dietary habits - Educate on the importance of gradual weight loss and long-term maintenance        Objective   Physical Exam:  Blood pressure 132/87, pulse 77, temperature (!) 97.5 F (36.4 C), height 5' 9 (1.753 m), weight 218 lb (98.9 kg), SpO2 98%. Body mass index is 32.19 kg/m.  General: He is overweight, cooperative, alert, well developed, and in no acute distress. PSYCH: Has normal mood, affect and thought process.   HEENT: EOMI, sclerae are anicteric. Lungs: Normal breathing effort, no conversational dyspnea. Extremities: No edema.  Neurologic: No gross sensory or motor deficits. No tremors or fasciculations noted.    Diagnostic Data Reviewed:  BMET    Component Value Date/Time   NA 141 09/28/2023 0830   NA 139 08/04/2023 1014   K 4.0 09/28/2023 0830   CL 106 09/28/2023 0830   CO2 27 09/28/2023 0830   GLUCOSE 112 (H) 09/28/2023 0830   BUN 12 09/28/2023 0830   BUN 10 08/04/2023 1014   CREATININE 1.13 09/28/2023 0830   CALCIUM  9.4 09/28/2023 0830   GFRNONAA 73 05/29/2021 0930   GFRAA 85 05/29/2021 0930   Lab Results  Component Value Date   HGBA1C 5.6 08/04/2023   HGBA1C 5.8 (H) 11/30/2017   Lab Results  Component Value Date   INSULIN  8.3 12/09/2023   Lab Results  Component  Value Date   TSH 1.470 12/09/2023   CBC    Component Value Date/Time   WBC 4.0 09/28/2023 0830   RBC 4.82 09/28/2023 0830   HGB 15.9 09/28/2023 0830   HGB 15.7 08/04/2023 1014   HCT 47.5 09/28/2023 0830   HCT 47.0 08/04/2023 1014   PLT 244 09/28/2023 0830   PLT 239 08/04/2023 1014   MCV 98.5 09/28/2023 0830   MCV 97 08/04/2023 1014   MCH 33.0 09/28/2023 0830   MCHC 33.5  09/28/2023 0830   RDW 12.7 09/28/2023 0830   RDW 13.0 08/04/2023 1014   Iron Studies No results found for: IRON, TIBC, FERRITIN, IRONPCTSAT Lipid Panel     Component Value Date/Time   CHOL 136 09/28/2023 0830   CHOL 120 08/04/2023 1014   TRIG 58 09/28/2023 0830   HDL 62 09/28/2023 0830   HDL 51 08/04/2023 1014   CHOLHDL 2.2 09/28/2023 0830   VLDL 20 05/11/2017 1032   LDLCALC 61 09/28/2023 0830   Hepatic Function Panel     Component Value Date/Time   PROT 7.3 09/28/2023 0830   PROT 7.0 08/04/2023 1014   ALBUMIN 4.4 08/04/2023 1014   AST 36 09/28/2023 0830   ALT 38 09/28/2023 0830   ALKPHOS 90 08/04/2023 1014   BILITOT 0.7 09/28/2023 0830   BILITOT 0.5 08/04/2023 1014      Component Value Date/Time   TSH 1.470 12/09/2023 0910   Nutritional Lab Results  Component Value Date   VD25OH 31.5 12/09/2023    Medications: Outpatient Encounter Medications as of 05/24/2024  Medication Sig   atorvastatin  (LIPITOR) 40 MG tablet Take 1 tablet (40 mg total) by mouth daily.   bictegravir-emtricitabine -tenofovir  AF (BIKTARVY ) 50-200-25 MG TABS tablet Take 1 tablet by mouth daily.   fluticasone  (FLONASE ) 50 MCG/ACT nasal spray Place 2 sprays into both nostrils daily.   levocetirizine (XYZAL  ALLERGY 24HR) 5 MG tablet Take 1 tablet (5 mg total) by mouth every evening.   phentermine  (ADIPEX-P ) 37.5 MG tablet Take 1 tablet (37.5 mg total) by mouth daily before breakfast.   No facility-administered encounter medications on file as of 05/24/2024.     Follow-Up   No follow-ups on file.SABRA He was informed of the importance of frequent follow up visits to maximize his success with intensive lifestyle modifications for his multiple health conditions.  Attestation Statement   Reviewed by clinician on day of visit: allergies, medications, problem list, medical history, surgical history, family history, social history, and previous encounter notes.     Lucas Parker, MD

## 2024-05-25 ENCOUNTER — Telehealth

## 2024-05-26 ENCOUNTER — Other Ambulatory Visit (HOSPITAL_COMMUNITY): Payer: Self-pay

## 2024-05-26 DIAGNOSIS — R21 Rash and other nonspecific skin eruption: Secondary | ICD-10-CM | POA: Diagnosis not present

## 2024-05-26 DIAGNOSIS — W57XXXA Bitten or stung by nonvenomous insect and other nonvenomous arthropods, initial encounter: Secondary | ICD-10-CM | POA: Diagnosis not present

## 2024-05-26 DIAGNOSIS — B20 Human immunodeficiency virus [HIV] disease: Secondary | ICD-10-CM | POA: Diagnosis not present

## 2024-05-26 MED ORDER — HYDROXYZINE HCL 25 MG PO TABS
12.5000 mg | ORAL_TABLET | Freq: Four times a day (QID) | ORAL | 0 refills | Status: DC | PRN
  Filled 2024-05-26: qty 30, 8d supply, fill #0

## 2024-05-26 MED ORDER — TRIAMCINOLONE ACETONIDE 0.1 % EX CREA
1.0000 | TOPICAL_CREAM | Freq: Three times a day (TID) | CUTANEOUS | 0 refills | Status: DC
  Filled 2024-05-26: qty 30, 10d supply, fill #0

## 2024-06-03 ENCOUNTER — Other Ambulatory Visit (HOSPITAL_COMMUNITY): Payer: Self-pay

## 2024-06-06 ENCOUNTER — Other Ambulatory Visit (HOSPITAL_COMMUNITY): Payer: Self-pay

## 2024-06-08 ENCOUNTER — Other Ambulatory Visit: Payer: Self-pay

## 2024-06-08 ENCOUNTER — Other Ambulatory Visit: Payer: Self-pay | Admitting: Pharmacy Technician

## 2024-06-08 NOTE — Progress Notes (Signed)
 Specialty Pharmacy Refill Coordination Note  Walter [name] is a 48 y.o. male contacted today regarding refills of specialty medication(s) Bictegravir-Emtricitab-Tenofov (Biktarvy )   Patient requested Delivery   Delivery date: 06/10/24   Verified address: [address] [place]   Medication will be filled on 06/09/24.

## 2024-06-16 ENCOUNTER — Other Ambulatory Visit (INDEPENDENT_AMBULATORY_CARE_PROVIDER_SITE_OTHER): Payer: Self-pay | Admitting: Internal Medicine

## 2024-06-16 DIAGNOSIS — F5089 Other specified eating disorder: Secondary | ICD-10-CM

## 2024-06-16 DIAGNOSIS — E6609 Other obesity due to excess calories: Secondary | ICD-10-CM

## 2024-06-20 ENCOUNTER — Other Ambulatory Visit (INDEPENDENT_AMBULATORY_CARE_PROVIDER_SITE_OTHER): Payer: Self-pay | Admitting: Internal Medicine

## 2024-06-20 ENCOUNTER — Other Ambulatory Visit: Payer: Self-pay

## 2024-06-20 DIAGNOSIS — E6609 Other obesity due to excess calories: Secondary | ICD-10-CM

## 2024-06-20 DIAGNOSIS — F5089 Other specified eating disorder: Secondary | ICD-10-CM

## 2024-06-21 ENCOUNTER — Other Ambulatory Visit (HOSPITAL_COMMUNITY): Payer: Self-pay

## 2024-06-21 ENCOUNTER — Encounter (INDEPENDENT_AMBULATORY_CARE_PROVIDER_SITE_OTHER): Payer: Self-pay | Admitting: Internal Medicine

## 2024-06-21 ENCOUNTER — Ambulatory Visit (INDEPENDENT_AMBULATORY_CARE_PROVIDER_SITE_OTHER): Admitting: Internal Medicine

## 2024-06-21 VITALS — BP 138/84 | HR 69 | Temp 97.6°F | Ht 69.0 in | Wt 214.0 lb

## 2024-06-21 DIAGNOSIS — Z6834 Body mass index (BMI) 34.0-34.9, adult: Secondary | ICD-10-CM

## 2024-06-21 DIAGNOSIS — F5089 Other specified eating disorder: Secondary | ICD-10-CM | POA: Diagnosis not present

## 2024-06-21 DIAGNOSIS — Z9189 Other specified personal risk factors, not elsewhere classified: Secondary | ICD-10-CM | POA: Diagnosis not present

## 2024-06-21 DIAGNOSIS — R030 Elevated blood-pressure reading, without diagnosis of hypertension: Secondary | ICD-10-CM | POA: Diagnosis not present

## 2024-06-21 DIAGNOSIS — E66811 Obesity, class 1: Secondary | ICD-10-CM

## 2024-06-21 MED ORDER — BUPROPION HCL ER (SR) 100 MG PO TB12
100.0000 mg | ORAL_TABLET | Freq: Every day | ORAL | 0 refills | Status: DC
  Filled 2024-06-21: qty 30, 30d supply, fill #0

## 2024-06-21 MED ORDER — PHENTERMINE HCL 37.5 MG PO TABS
37.5000 mg | ORAL_TABLET | Freq: Every day | ORAL | 0 refills | Status: DC
Start: 2024-06-21 — End: 2024-07-18
  Filled 2024-06-21 – 2024-07-05 (×2): qty 30, 30d supply, fill #0

## 2024-06-21 NOTE — Progress Notes (Signed)
 Office: [phone]  /  Fax: [phone]  Weight Summary and Body Composition Analysis (BIA)  Vitals Temp: 97.6 F (36.4 C) BP: 138/84 Pulse Rate: 69 SpO2: 96 %   Anthropometric Measurements Height: 5' 9 (1.753 m) Weight: 214 lb (97.1 kg) BMI (Calculated): 31.59 Weight at Last Visit: 218 lb Weight Lost Since Last Visit: 4 lb Weight Gained Since Last Visit: 0 lb Starting Weight: 242 lb Total Weight Loss (lbs): 28 lb (12.7 kg) Peak Weight: 280 lb   Body Composition  Body Fat %: 26.4 % Fat Mass (lbs): 56.6 lbs Muscle Mass (lbs): 150 lbs Total Body Water (lbs): 109.6 lbs Visceral Fat Rating : 13    RMR: 2022  Today's Visit #: 9  Starting Date: 12/09/23   Subjective   Chief Complaint: Obesity  Interval History Discussed the use of AI scribe software for clinical note transcription with the patient, who gave verbal consent to proceed.  History of Present Illness   Walter [name] is a 48 year old male with obesity and HIV who presents for medical weight management.  Walter [name] has been participating in a weight management program since January, resulting in a total weight loss of 28 pounds, with a recent loss of 4 pounds since the last visit. His body fat percentage is currently 26%, and his visceral fat rating has decreased from 17 to 13, with a goal of less than 10 for visceral fat.  Walter [name] has a history of HIV and is on highly active antiretroviral treatment. Walter [name] is aware of the increased risk of cardiovascular disease associated with HIV.  Walter [name] is currently taking bupropion  SR 100 mg daily, which has helped him feel calmer and manage cravings better, contributing to his weight loss. Walter [name] is also on phentermine  37.5 mg once a day for obesity.  I have referred him to Dr. Sharron for emotional eating but appointment was not scheduled at the last office visit.  Walter [name] consumes caffeinated beverages, including green and herbal teas, which may contribute to elevated blood  pressure.  Walter [name] exercises five days a week and is incorporating high-intensity interval training to boost metabolism. Walter [name] is also maintaining adequate protein intake to protect muscle mass during weight loss.       Challenges affecting patient progress: none.    Pharmacotherapy for weight management: Walter [name] is currently taking Bupropion  (single agent, off label use) with adequate clinical response  and without side effects. and Phentermine  (longterm use, single agent)  with adequate clinical response  and without side effects..   Assessment and Plan   Treatment Plan For Obesity:  Recommended Dietary Goals  Walter [name] is currently in the action stage of change. As such, his goal is to continue weight management plan. Walter [name] has agreed to: continue current plan  Behavioral Health and Counseling  We discussed the following behavioral modification strategies today: continue to work on maintaining a reduced calorie state, getting the recommended amount of protein, incorporating whole foods, making healthy choices, staying well hydrated and practicing mindfulness when eating..  Additional education and resources provided today: None  Recommended Physical Activity Goals  Walter [name] has been advised to work up to 150 minutes of moderate intensity aerobic activity a week and strengthening exercises 2-3 times per week for cardiovascular health, weight loss maintenance and preservation of muscle mass.   Walter [name] has agreed to :  Think about enjoyable ways to increase daily physical activity and overcoming barriers to exercise and Increase physical activity in their day and reduce sedentary time (increase  NEAT).  Medical Interventions and Pharmacotherapy  We discussed various medication options to help Walter [name] with his weight loss efforts and we both agreed to : Adequate clinical response to anti-obesity medication, continue current regimen  Associated Conditions Impacted by Obesity Treatment  Assessment &  Plan Class 1 obesity due to excess calories with serious comorbidity and body mass index (BMI) of 34.0 to 34.9 in adult Obesity with emotional eating, managed with phentermine  37.5 mg daily and bupropion  SR 100 mg daily. Since January, Walter [name] has lost 28 pounds, with a current body fat percentage of 26% and a visceral fat rating of 13, down from 17. The goal is to reduce visceral fat to less than 10. Bupropion  has been effective in reducing cravings and improving mood due to its antidepressant and anxiolytic effects. Phentermine  and bupropion  may contribute to elevated blood pressure, especially with caffeine. Walter [name] has achieved a 23% weight loss, significantly higher than the typical 10% with lifestyle changes or medications alone. - Continue phentermine  37.5 mg daily - Continue bupropion  SR 100 mg daily - Refer to behavioral health specialist for psychological support - Advise reducing caffeine intake to manage blood pressure - Encourage continuation of current exercise regimen, including high-intensity interval training once a week - Monitor protein intake to protect muscle mass, aiming for 120 grams per day Other disorder of eating As above At increased risk for cardiovascular disease Discussed modifiable risk factors for cardiovascular disease including blood pressure control.  Walter [name] is currently on statin therapy and is working on losing weight and maintaining an active lifestyle. Elevated blood pressure reading without diagnosis of hypertension Elevated blood pressure at 133/90 mmHg, potentially influenced by sympathomimetic medications (phentermine  and bupropion ) and caffeine intake. Optimal blood pressure is 120/80 mmHg. Close monitoring is required to prevent cardiovascular complications. - Monitor blood pressure closely - Advise discontinuation of caffeinated beverages - Consider deprescribing stimulants if blood pressure does not improve with lifestyle modifications          Objective    Physical Exam:  Blood pressure 138/84, pulse 69, temperature 97.6 F (36.4 C), height 5' 9 (1.753 m), weight 214 lb (97.1 kg), SpO2 96%. Body mass index is 31.6 kg/m.  General: Walter [name] is overweight, cooperative, alert, well developed, and in no acute distress. PSYCH: Has normal mood, affect and thought process.   HEENT: EOMI, sclerae are anicteric. Lungs: Normal breathing effort, no conversational dyspnea. Extremities: No edema.  Neurologic: No gross sensory or motor deficits. No tremors or fasciculations noted.    Diagnostic Data Reviewed:  BMET    Component Value Date/Time   NA 141 09/28/2023 0830   NA 139 08/04/2023 1014   K 4.0 09/28/2023 0830   CL 106 09/28/2023 0830   CO2 27 09/28/2023 0830   GLUCOSE 112 (H) 09/28/2023 0830   BUN 12 09/28/2023 0830   BUN 10 08/04/2023 1014   CREATININE 1.13 09/28/2023 0830   CALCIUM  9.4 09/28/2023 0830   GFRNONAA 73 05/29/2021 0930   GFRAA 85 05/29/2021 0930   Lab Results  Component Value Date   HGBA1C 5.6 08/04/2023   HGBA1C 5.8 (H) 11/30/2017   Lab Results  Component Value Date   INSULIN  8.3 12/09/2023   Lab Results  Component Value Date   TSH 1.470 12/09/2023   CBC    Component Value Date/Time   WBC 4.0 09/28/2023 0830   RBC 4.82 09/28/2023 0830   HGB 15.9 09/28/2023 0830   HGB 15.7 08/04/2023 1014   HCT 47.5 09/28/2023 0830  HCT 47.0 08/04/2023 1014   PLT 244 09/28/2023 0830   PLT 239 08/04/2023 1014   MCV 98.5 09/28/2023 0830   MCV 97 08/04/2023 1014   MCH 33.0 09/28/2023 0830   MCHC 33.5 09/28/2023 0830   RDW 12.7 09/28/2023 0830   RDW 13.0 08/04/2023 1014   Iron Studies No results found for: IRON, TIBC, FERRITIN, IRONPCTSAT Lipid Panel     Component Value Date/Time   CHOL 136 09/28/2023 0830   CHOL 120 08/04/2023 1014   TRIG 58 09/28/2023 0830   HDL 62 09/28/2023 0830   HDL 51 08/04/2023 1014   CHOLHDL 2.2 09/28/2023 0830   VLDL 20 05/11/2017 1032   LDLCALC 61 09/28/2023 0830    Hepatic Function Panel     Component Value Date/Time   PROT 7.3 09/28/2023 0830   PROT 7.0 08/04/2023 1014   ALBUMIN 4.4 08/04/2023 1014   AST 36 09/28/2023 0830   ALT 38 09/28/2023 0830   ALKPHOS 90 08/04/2023 1014   BILITOT 0.7 09/28/2023 0830   BILITOT 0.5 08/04/2023 1014      Component Value Date/Time   TSH 1.470 12/09/2023 0910   Nutritional Lab Results  Component Value Date   VD25OH 31.5 12/09/2023    Medications: Outpatient Encounter Medications as of 06/21/2024  Medication Sig   atorvastatin  (LIPITOR) 40 MG tablet Take 1 tablet (40 mg total) by mouth daily.   bictegravir-emtricitabine -tenofovir  AF (BIKTARVY ) 50-200-25 MG TABS tablet Take 1 tablet by mouth daily.   buPROPion  ER (WELLBUTRIN  SR) 100 MG 12 hr tablet Take 1 tablet (100 mg total) by mouth daily.   fluticasone  (FLONASE ) 50 MCG/ACT nasal spray Place 2 sprays into both nostrils daily.   levocetirizine (XYZAL  ALLERGY 24HR) 5 MG tablet Take 1 tablet (5 mg total) by mouth every evening.   phentermine  (ADIPEX-P ) 37.5 MG tablet Take 1 tablet (37.5 mg total) by mouth daily before breakfast.   [DISCONTINUED] hydrOXYzine  (ATARAX ) 25 MG tablet Take 0.5-1 tablets (12.5-25 mg total) by mouth every 6 (six) hours as needed for itching.   [DISCONTINUED] triamcinolone  cream (KENALOG ) 0.1 % Apply 1 Application topically 3 (three) times daily.   No facility-administered encounter medications on file as of 06/21/2024.     Follow-Up   No follow-ups on file.SABRA Walter [name] was informed of the importance of frequent follow up visits to maximize his success with intensive lifestyle modifications for his multiple health conditions.  Attestation Statement   Reviewed by clinician on day of visit: allergies, medications, problem list, medical history, surgical history, family history, social history, and previous encounter notes.     Lucas Parker, MD

## 2024-06-21 NOTE — Assessment & Plan Note (Signed)
 As above

## 2024-06-21 NOTE — Assessment & Plan Note (Signed)
 Obesity with emotional eating, managed with phentermine  37.5 mg daily and bupropion  SR 100 mg daily. Since January, he has lost 28 pounds, with a current body fat percentage of 26% and a visceral fat rating of 13, down from 17. The goal is to reduce visceral fat to less than 10. Bupropion  has been effective in reducing cravings and improving mood due to its antidepressant and anxiolytic effects. Phentermine  and bupropion  may contribute to elevated blood pressure, especially with caffeine. He has achieved a 23% weight loss, significantly higher than the typical 10% with lifestyle changes or medications alone. - Continue phentermine  37.5 mg daily - Continue bupropion  SR 100 mg daily - Refer to behavioral health specialist for psychological support - Advise reducing caffeine intake to manage blood pressure - Encourage continuation of current exercise regimen, including high-intensity interval training once a week - Monitor protein intake to protect muscle mass, aiming for 120 grams per day

## 2024-06-21 NOTE — Assessment & Plan Note (Signed)
 Discussed modifiable risk factors for cardiovascular disease including blood pressure control.  He is currently on statin therapy and is working on losing weight and maintaining an active lifestyle.

## 2024-06-24 ENCOUNTER — Other Ambulatory Visit (HOSPITAL_COMMUNITY): Payer: Self-pay

## 2024-07-01 ENCOUNTER — Other Ambulatory Visit: Payer: Self-pay

## 2024-07-02 ENCOUNTER — Encounter (INDEPENDENT_AMBULATORY_CARE_PROVIDER_SITE_OTHER): Payer: Self-pay

## 2024-07-04 ENCOUNTER — Other Ambulatory Visit: Payer: Self-pay

## 2024-07-04 NOTE — Progress Notes (Signed)
 Specialty Pharmacy Refill Coordination Note  Walter [name] is a 48 y.o. male contacted today regarding refills of specialty medication(s) Bictegravir-Emtricitab-Tenofov (Biktarvy )   Patient requested Delivery   Delivery date: 07/06/24   Verified address: [address] [place] [address]   Medication will be filled on 07/05/24.   Patient is aware that this fill date is dependent on patient contacting their primary insurance that is under a grace period, resulting in a copay of $3,792.85. Patient to contact insurance for updated status of prescription coverage.

## 2024-07-04 NOTE — Progress Notes (Addendum)
 Patient returned call and has paid premium. Confirmed with patient that he had enough medication and explained that it may be 3 days before insurance goes through. Will ship as soon as insurance will process.  Insurance is processing and medication will ship 8/11.

## 2024-07-05 ENCOUNTER — Other Ambulatory Visit (HOSPITAL_COMMUNITY): Payer: Self-pay

## 2024-07-05 ENCOUNTER — Other Ambulatory Visit: Payer: Self-pay

## 2024-07-05 ENCOUNTER — Encounter: Payer: Self-pay | Admitting: Pharmacist

## 2024-07-18 ENCOUNTER — Encounter (INDEPENDENT_AMBULATORY_CARE_PROVIDER_SITE_OTHER): Payer: Self-pay | Admitting: Internal Medicine

## 2024-07-18 ENCOUNTER — Other Ambulatory Visit (HOSPITAL_COMMUNITY): Payer: Self-pay

## 2024-07-18 ENCOUNTER — Ambulatory Visit (INDEPENDENT_AMBULATORY_CARE_PROVIDER_SITE_OTHER): Admitting: Internal Medicine

## 2024-07-18 VITALS — BP 132/82 | HR 64 | Temp 97.7°F | Ht 69.0 in | Wt 214.0 lb

## 2024-07-18 DIAGNOSIS — Z6831 Body mass index (BMI) 31.0-31.9, adult: Secondary | ICD-10-CM | POA: Insufficient documentation

## 2024-07-18 DIAGNOSIS — F5089 Other specified eating disorder: Secondary | ICD-10-CM | POA: Diagnosis not present

## 2024-07-18 DIAGNOSIS — E669 Obesity, unspecified: Secondary | ICD-10-CM | POA: Diagnosis not present

## 2024-07-18 DIAGNOSIS — E66811 Obesity, class 1: Secondary | ICD-10-CM

## 2024-07-18 MED ORDER — PHENTERMINE HCL 37.5 MG PO TABS
37.5000 mg | ORAL_TABLET | Freq: Every day | ORAL | 0 refills | Status: DC
  Filled 2024-07-18 – 2024-08-03 (×2): qty 30, 30d supply, fill #0

## 2024-07-18 MED ORDER — BUPROPION HCL ER (SR) 100 MG PO TB12
100.0000 mg | ORAL_TABLET | Freq: Every day | ORAL | 0 refills | Status: DC
  Filled 2024-07-18: qty 30, 30d supply, fill #0
  Filled 2024-08-18: qty 30, 30d supply, fill #1

## 2024-07-18 NOTE — Assessment & Plan Note (Signed)
 Weight: decrease of 28 lb (11.6%) over 7 months, 1 week  Start: 12/09/2023 242 lb (109.8 kg)  End: 07/18/2024 214 lb (97.1 kg)   Continue 1500-calorie nutrition plan.  Continue phentermine  at current dose he is at more than 5% weight loss heart rate and blood pressure well-controlled.

## 2024-07-18 NOTE — Progress Notes (Signed)
 Office: [phone]  /  Fax: [phone]  Weight Summary and Body Composition Analysis (BIA)  Vitals Temp: 97.7 F (36.5 C) BP: 132/82 Pulse Rate: 64 SpO2: 98 %   Anthropometric Measurements Height: 5' 9 (1.753 m) Weight: 214 lb (97.1 kg) BMI (Calculated): 31.59 Weight at Last Visit: 214lb Weight Lost Since Last Visit: 0lb Weight Gained Since Last Visit: 0lb Starting Weight: 242lb Total Weight Loss (lbs): 28 lb (12.7 kg) Peak Weight: 280lb   Body Composition  Body Fat %: 26.2 % Fat Mass (lbs): 56.2 lbs Muscle Mass (lbs): 150.4 lbs Total Body Water (lbs): 112.2 lbs Visceral Fat Rating : 13    RMR: 2022  Today's Visit #: 10  Starting Date: 12/09/23   Subjective   Chief Complaint: Obesity  Interval History Discussed the use of AI scribe software for clinical note transcription with the patient, who gave verbal consent to proceed.  History of Present Illness   Walter [name] is a 48 year old male who presents for medical weight management follow-up.  He is following a 1500-calorie nutrition plan 80% of the time.  He is tracking more whole foods does not feel like he is getting the recommended amount of protein he is maintaining adequate hydration occasionally skips lunch when he gets really busy.  He is exercising 5 days a week 60 minutes.  Since his last visit, he has maintained his weight, which he considers a positive outcome given his current life circumstances. His body fat percentage is 26% with a goal of less than 20%, and his muscle mass is 150 pounds. His fat mass has decreased from 77 pounds to 56 pounds since starting the program.  His physical activity has been consistent, but not as intense due to increased work demands. His eating habits have been irregular, with meals sometimes skipped due to a busy schedule. No emotional eating, and cravings are well-controlled. 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  He has stopped consuming energy drinks and herbal tea, and does not drink sodas. His blood pressure is 132/82 mmHg. He is monitoring his intake of caffeinated products.  He discusses his protein intake, noting that he often relies on protein bars and shakes due to convenience. He is interested in understanding the differences between protein sources and how they affect satiety and digestion.  He is currently taking phentermine  and Wellbutrin . Wellbutrin  has been beneficial in managing his anxiety and controlling cravings. Others have observed him being calmer since starting the medication.  Socially, he mentions that he has a daughter named Clemencia, who is expecting a baby in November. He is preparing to become a grandfather.       Challenges affecting patient progress: none.    Pharmacotherapy for weight management: He is currently taking Bupropion  (single agent, off label use) with adequate clinical response  and without side effects. and Phentermine  (longterm use, off-label, single agent)  with adequate clinical response  and without side effects..   Assessment and Plan   Treatment Plan For Obesity:  Recommended Dietary Goals  Walter [name] is currently in the action stage of change. As such, his goal is to continue weight management plan. He has agreed to: incorporate 1-2 meal replacements a day for convenience  and continue current plan  Behavioral Health and Counseling  We discussed the following behavioral modification strategies today: continue to work on maintaining a reduced calorie state, getting the recommended amount of protein, incorporating whole foods, making healthy  choices, staying well hydrated and practicing mindfulness when eating. and increase protein intake, fibrous foods (25 grams per day for women, 30 grams for men) and water to improve satiety and decrease hunger signals. .  Additional education and resources provided today: None  Recommended Physical  Activity Goals  Walter [name] has been advised to work up to 150 minutes of moderate intensity aerobic activity a week and strengthening exercises 2-3 times per week for cardiovascular health, weight loss maintenance and preservation of muscle mass.  He has agreed to :  Think about enjoyable ways to increase daily physical activity and overcoming barriers to exercise, Increase physical activity in their day and reduce sedentary time (increase NEAT)., Increase volume of physical activity to a goal of 240 minutes a week, and Combine aerobic and strengthening exercises for efficiency and improved cardiometabolic health.  Medical Interventions and Pharmacotherapy  We discussed various medication options to help Walter [name] with his weight loss efforts and we both agreed to : Adequate clinical response to anti-obesity medication, continue current regimen  Associated Conditions Impacted by Obesity Treatment  Assessment & Plan Generalized obesity - with starting BMI 35 Weight: decrease of 28 lb (11.6%) over 7 months, 1 week  Start: 12/09/2023 242 lb (109.8 kg)  End: 07/18/2024 214 lb (97.1 kg)   Continue 1500-calorie nutrition plan.  Continue phentermine  at current dose he is at more than 5% weight loss heart rate and blood pressure well-controlled. BMI 31.0-31.9,adult  Other disorder of eating Patient has a degree of emotional hunger.  He has noticed an improvement with Wellbutrin .  Continue medication blood pressure and heart rate are relatively well-controlled.      Objective   Physical Exam:  Blood pressure 132/82, pulse 64, temperature 97.7 F (36.5 C), height 5' 9 (1.753 m), weight 214 lb (97.1 kg), SpO2 98%. Body mass index is 31.6 kg/m.  General: He is overweight, cooperative, alert, well developed, and in no acute distress. PSYCH: Has normal mood, affect and thought process.   HEENT: EOMI, sclerae are anicteric. Lungs: Normal breathing effort, no conversational dyspnea. Extremities:  No edema.  Neurologic: No gross sensory or motor deficits. No tremors or fasciculations noted.    Diagnostic Data Reviewed:  BMET    Component Value Date/Time   NA 141 09/28/2023 0830   NA 139 08/04/2023 1014   K 4.0 09/28/2023 0830   CL 106 09/28/2023 0830   CO2 27 09/28/2023 0830   GLUCOSE 112 (H) 09/28/2023 0830   BUN 12 09/28/2023 0830   BUN 10 08/04/2023 1014   CREATININE 1.13 09/28/2023 0830   CALCIUM  9.4 09/28/2023 0830   GFRNONAA 73 05/29/2021 0930   GFRAA 85 05/29/2021 0930   Lab Results  Component Value Date   HGBA1C 5.6 08/04/2023   HGBA1C 5.8 (H) 11/30/2017   Lab Results  Component Value Date   INSULIN  8.3 12/09/2023   Lab Results  Component Value Date   TSH 1.470 12/09/2023   CBC    Component Value Date/Time   WBC 4.0 09/28/2023 0830   RBC 4.82 09/28/2023 0830   HGB 15.9 09/28/2023 0830   HGB 15.7 08/04/2023 1014   HCT 47.5 09/28/2023 0830   HCT 47.0 08/04/2023 1014   PLT 244 09/28/2023 0830   PLT 239 08/04/2023 1014   MCV 98.5 09/28/2023 0830   MCV 97 08/04/2023 1014   MCH 33.0 09/28/2023 0830   MCHC 33.5 09/28/2023 0830   RDW 12.7 09/28/2023 0830   RDW 13.0 08/04/2023 1014   Iron Studies  No results found for: IRON, TIBC, FERRITIN, IRONPCTSAT Lipid Panel     Component Value Date/Time   CHOL 136 09/28/2023 0830   CHOL 120 08/04/2023 1014   TRIG 58 09/28/2023 0830   HDL 62 09/28/2023 0830   HDL 51 08/04/2023 1014   CHOLHDL 2.2 09/28/2023 0830   VLDL 20 05/11/2017 1032   LDLCALC 61 09/28/2023 0830   Hepatic Function Panel     Component Value Date/Time   PROT 7.3 09/28/2023 0830   PROT 7.0 08/04/2023 1014   ALBUMIN 4.4 08/04/2023 1014   AST 36 09/28/2023 0830   ALT 38 09/28/2023 0830   ALKPHOS 90 08/04/2023 1014   BILITOT 0.7 09/28/2023 0830   BILITOT 0.5 08/04/2023 1014      Component Value Date/Time   TSH 1.470 12/09/2023 0910   Nutritional Lab Results  Component Value Date   VD25OH 31.5 12/09/2023     Medications: Outpatient Encounter Medications as of 07/18/2024  Medication Sig   atorvastatin  (LIPITOR) 40 MG tablet Take 1 tablet (40 mg total) by mouth daily.   bictegravir-emtricitabine -tenofovir  AF (BIKTARVY ) 50-200-25 MG TABS tablet Take 1 tablet by mouth daily.   fluticasone  (FLONASE ) 50 MCG/ACT nasal spray Place 2 sprays into both nostrils daily.   levocetirizine (XYZAL  ALLERGY 24HR) 5 MG tablet Take 1 tablet (5 mg total) by mouth every evening.   [DISCONTINUED] buPROPion  ER (WELLBUTRIN  SR) 100 MG 12 hr tablet Take 1 tablet (100 mg total) by mouth daily.   [DISCONTINUED] phentermine  (ADIPEX-P ) 37.5 MG tablet Take 1 tablet (37.5 mg total) by mouth daily before breakfast.   buPROPion  ER (WELLBUTRIN  SR) 100 MG 12 hr tablet Take 1 tablet (100 mg total) by mouth daily.   phentermine  (ADIPEX-P ) 37.5 MG tablet Take 1 tablet (37.5 mg total) by mouth daily before breakfast.   No facility-administered encounter medications on file as of 07/18/2024.     Follow-Up   Return in about 4 weeks (around 08/15/2024) for For Weight Mangement with Dr. Francyne.SABRA He was informed of the importance of frequent follow up visits to maximize his success with intensive lifestyle modifications for his multiple health conditions.  Attestation Statement   Reviewed by clinician on day of visit: allergies, medications, problem list, medical history, surgical history, family history, social history, and previous encounter notes.     Lucas Francyne, MD

## 2024-07-18 NOTE — Assessment & Plan Note (Signed)
 Patient has a degree of emotional hunger.  He has noticed an improvement with Wellbutrin .  Continue medication blood pressure and heart rate are relatively well-controlled.

## 2024-07-19 ENCOUNTER — Encounter (INDEPENDENT_AMBULATORY_CARE_PROVIDER_SITE_OTHER): Payer: Self-pay

## 2024-07-19 ENCOUNTER — Other Ambulatory Visit (HOSPITAL_COMMUNITY): Payer: Self-pay

## 2024-07-27 ENCOUNTER — Encounter (INDEPENDENT_AMBULATORY_CARE_PROVIDER_SITE_OTHER): Payer: Self-pay

## 2024-07-28 ENCOUNTER — Other Ambulatory Visit: Payer: Self-pay | Admitting: Pharmacy Technician

## 2024-07-28 ENCOUNTER — Other Ambulatory Visit: Payer: Self-pay

## 2024-07-28 NOTE — Progress Notes (Signed)
 Specialty Pharmacy Refill Coordination Note  MASSON NALEPA is a 48 y.o. male contacted today regarding refills of specialty medication(s) Bictegravir-Emtricitab-Tenofov (Biktarvy )   Patient requested (Patient-Rptd) Delivery   Delivery date: 08/02/24 Verified address: (Patient-Rptd) [address] [place] [address]   Medication will be filled on 08/01/24.

## 2024-08-01 ENCOUNTER — Other Ambulatory Visit (HOSPITAL_COMMUNITY)
Admission: RE | Admit: 2024-08-01 | Discharge: 2024-08-01 | Disposition: A | Discharge status: Home / Self Care | Source: Ambulatory Visit | Attending: Infectious Disease | Admitting: Infectious Disease

## 2024-08-01 ENCOUNTER — Ambulatory Visit (INDEPENDENT_AMBULATORY_CARE_PROVIDER_SITE_OTHER)

## 2024-08-01 ENCOUNTER — Other Ambulatory Visit: Payer: Self-pay

## 2024-08-01 ENCOUNTER — Telehealth: Payer: Self-pay

## 2024-08-01 ENCOUNTER — Other Ambulatory Visit (HOSPITAL_COMMUNITY): Payer: Self-pay

## 2024-08-01 DIAGNOSIS — Z7185 Encounter for immunization safety counseling: Secondary | ICD-10-CM

## 2024-08-01 DIAGNOSIS — B20 Human immunodeficiency virus [HIV] disease: Secondary | ICD-10-CM | POA: Insufficient documentation

## 2024-08-01 DIAGNOSIS — Z23 Encounter for immunization: Secondary | ICD-10-CM

## 2024-08-01 NOTE — Telephone Encounter (Signed)
 [facility] Patient Advocate Encounter   I was successful in securing patient a $5000 grant from [employer] ([employer]) to provide copayment coverage for BIKTARVY .  This will make the out of pocket cost $0.     I have spoken with the patient.    The billing information is as follows and has been shared with [facility].   RxBin: [id number] PCN:   [id number] Member ID: [id number] Group ID: [id number] Dates of Eligibility: 03/30/24 through 08/01/25  Patient knows to call the office with questions or concerns.     Charmaine Sharps, CPhT Specialty Pharmacy Patient [facility] Phone: [phone] Fax:  [phone]

## 2024-08-01 NOTE — Progress Notes (Signed)
 Nurse Visit  Walter [name] 10/28/1976   No Known Allergies   Reviewed allergies with patient.   Medications administered: none  Immunizations administered: Flu  Patient tolerated well.   Walter [name], BSN, RN

## 2024-08-02 LAB — URINE CYTOLOGY ANCILLARY ONLY
Chlamydia: NEGATIVE
Comment: NEGATIVE
Comment: NORMAL
Neisseria Gonorrhea: NEGATIVE

## 2024-08-02 LAB — T-HELPER CELL (CD4) - (RCID CLINIC ONLY)
CD4 % Helper T Cell: 28 % — ABNORMAL LOW (ref 33–65)
CD4 T Cell Abs: 480 /uL (ref 400–1790)

## 2024-08-03 ENCOUNTER — Other Ambulatory Visit (HOSPITAL_COMMUNITY): Payer: Self-pay

## 2024-08-03 LAB — COMPLETE METABOLIC PANEL WITHOUT GFR
AG Ratio: 1.5 (calc) (ref 1.0–2.5)
ALT: 35 U/L (ref 9–46)
AST: 35 U/L (ref 10–40)
Albumin: 4.3 g/dL (ref 3.6–5.1)
Alkaline phosphatase (APISO): 85 U/L (ref 36–130)
BUN: 16 mg/dL (ref 7–25)
CO2: 26 mmol/L (ref 20–32)
Calcium: 9.1 mg/dL (ref 8.6–10.3)
Chloride: 104 mmol/L (ref 98–110)
Creat: 1.18 mg/dL (ref 0.60–1.29)
Globulin: 2.9 g/dL (ref 1.9–3.7)
Glucose, Bld: 101 mg/dL — ABNORMAL HIGH (ref 65–99)
Potassium: 4.1 mmol/L (ref 3.5–5.3)
Sodium: 137 mmol/L (ref 135–146)
Total Bilirubin: 0.6 mg/dL (ref 0.2–1.2)
Total Protein: 7.2 g/dL (ref 6.1–8.1)

## 2024-08-03 LAB — HIV-1 RNA QUANT-NO REFLEX-BLD
HIV 1 RNA Quant: NOT DETECTED {copies}/mL
HIV-1 RNA Quant, Log: NOT DETECTED {Log_copies}/mL

## 2024-08-03 LAB — CBC WITH DIFFERENTIAL/PLATELET
Absolute Lymphocytes: 1780 {cells}/uL (ref 850–3900)
Absolute Monocytes: 525 {cells}/uL (ref 200–950)
Basophils Absolute: 60 {cells}/uL (ref 0–200)
Basophils Relative: 1.4 %
Eosinophils Absolute: 60 {cells}/uL (ref 15–500)
Eosinophils Relative: 1.4 %
HCT: 46.7 % (ref 38.5–50.0)
Hemoglobin: 15.7 g/dL (ref 13.2–17.1)
MCH: 33.2 pg — ABNORMAL HIGH (ref 27.0–33.0)
MCHC: 33.6 g/dL (ref 32.0–36.0)
MCV: 98.7 fL (ref 80.0–100.0)
MPV: 8.9 fL (ref 7.5–12.5)
Monocytes Relative: 12.2 %
Neutro Abs: 1875 {cells}/uL (ref 1500–7800)
Neutrophils Relative %: 43.6 %
Platelets: 247 Thousand/uL (ref 140–400)
RBC: 4.73 Million/uL (ref 4.20–5.80)
RDW: 12.9 % (ref 11.0–15.0)
Total Lymphocyte: 41.4 %
WBC: 4.3 Thousand/uL (ref 3.8–10.8)

## 2024-08-03 LAB — LIPID PANEL
Cholesterol: 134 mg/dL (ref <200)
HDL: 57 mg/dL (ref >=40)
LDL Cholesterol (Calc): 63 mg/dL
Non-HDL Cholesterol (Calc): 77 mg/dL (ref <130)
Total CHOL/HDL Ratio: 2.4 (calc) (ref <5.0)
Triglycerides: 55 mg/dL (ref <150)

## 2024-08-03 LAB — HEPATITIS C ANTIBODY: Hepatitis C Ab: NONREACTIVE

## 2024-08-03 LAB — RPR: RPR Ser Ql: NONREACTIVE

## 2024-08-04 LAB — CYTOLOGY - PAP
Comment: NEGATIVE
Diagnosis: NEGATIVE
High risk HPV: NEGATIVE

## 2024-08-08 ENCOUNTER — Other Ambulatory Visit (HOSPITAL_COMMUNITY): Payer: Self-pay

## 2024-08-15 ENCOUNTER — Ambulatory Visit (INDEPENDENT_AMBULATORY_CARE_PROVIDER_SITE_OTHER): Admitting: Internal Medicine

## 2024-08-15 ENCOUNTER — Encounter (INDEPENDENT_AMBULATORY_CARE_PROVIDER_SITE_OTHER): Payer: Self-pay | Admitting: Internal Medicine

## 2024-08-15 ENCOUNTER — Other Ambulatory Visit (HOSPITAL_COMMUNITY): Payer: Self-pay

## 2024-08-15 VITALS — BP 136/89 | HR 75 | Temp 97.6°F | Ht 69.0 in | Wt 214.0 lb

## 2024-08-15 DIAGNOSIS — Z6831 Body mass index (BMI) 31.0-31.9, adult: Secondary | ICD-10-CM

## 2024-08-15 DIAGNOSIS — I10 Essential (primary) hypertension: Secondary | ICD-10-CM | POA: Diagnosis not present

## 2024-08-15 DIAGNOSIS — R7303 Prediabetes: Secondary | ICD-10-CM | POA: Diagnosis not present

## 2024-08-15 DIAGNOSIS — E66811 Obesity, class 1: Secondary | ICD-10-CM | POA: Diagnosis not present

## 2024-08-15 DIAGNOSIS — F5089 Other specified eating disorder: Secondary | ICD-10-CM | POA: Diagnosis not present

## 2024-08-15 MED ORDER — PHENTERMINE HCL 37.5 MG PO TABS
37.5000 mg | ORAL_TABLET | Freq: Every day | ORAL | 0 refills | Status: DC
  Filled 2024-08-15 – 2024-08-29 (×2): qty 30, 30d supply, fill #0

## 2024-08-15 NOTE — Assessment & Plan Note (Signed)
 Weight: decrease of 28 lb (11.6%) over 8 months, 1 week  Start: 12/09/2023 242 lb (109.8 kg)  End: 08/15/2024 214 lb (97.1 kg)   Obesity with emotional eating is being managed with a 1500 calorie nutrition plan, regular exercise, and medications (phentermine  and bupropion ). Body composition has improved with increased muscle mass and decreased body fat percentage. Current body fat percentage is 25%, down from 31-32%. Visceral fat rating has decreased from 16-17 to 12. Emotional hunger is being managed with bupropion , which has been effective in reducing cravings. He is advised to focus on body composition rather than BMI due to high muscle mass. - Continue phentermine  and bupropion  as prescribed. - Encourage maintaining a 1500 calorie nutrition plan and regular exercise (240 minutes per week). - Advise against skipping meals to prevent muscle loss and ensure adequate protein intake. - Recommend protein bar or shake with fruit in the morning if not hungry. - Reinforce the importance of tracking and self-monitoring to adjust physical activity and nutrition as needed. - His goal weight is  210

## 2024-08-15 NOTE — Progress Notes (Deleted)
 Office: [phone]  /  Fax: [phone]  Weight Summary and Body Composition Analysis (BIA)  Vitals Temp: 97.6 F (36.4 C) BP: 136/89 Pulse Rate: 75 SpO2: 100 %   Anthropometric Measurements Height: 5' 9 (1.753 m) Weight: 214 lb (97.1 kg) BMI (Calculated): 31.59 Weight at Last Visit: 214 lb Weight Lost Since Last Visit: 0 lb Weight Gained Since Last Visit: 0 lb Starting Weight: 242 lb Total Weight Loss (lbs): 28 lb (12.7 kg) Peak Weight: 280 lb   Body Composition  Body Fat %: 25.5 % Fat Mass (lbs): 54.8 lbs Muscle Mass (lbs): 152 lbs Total Body Water (lbs): 109 lbs Visceral Fat Rating : 12    RMR: 2022  Today's Visit #: 11  Starting Date: 12/09/23   Subjective   Chief Complaint: Obesity  Interval History Discussed the use of AI scribe software for clinical note transcription with the patient, who gave verbal consent to proceed.  History of Present Illness      Challenges affecting patient progress: {EMOBESITYBARRIERS:28841::none}.    Pharmacotherapy for weight management: He is currently taking {EMPharmaco:28845}.   Assessment and Plan   Treatment Plan For Obesity:  Recommended Dietary Goals  Walter [name] is currently in the action stage of change. As such, his goal is to continue weight management plan. He has agreed to: {EMWTLOSSPLAN:29297::continue current plan}  Behavioral Health and Counseling  We discussed the following behavioral modification strategies today: {EMWMwtlossstrategies:28914::continue to work on maintaining a reduced calorie state, getting the recommended amount of protein, incorporating whole foods, making healthy choices, staying well hydrated and practicing mindfulness when eating.,increase protein intake, fibrous foods (25 grams per day for women, 30 grams for men) and water to improve satiety and decrease hunger signals. }.  Additional education and resources provided  today: {EMadditionalresources:29169::None}  Recommended Physical Activity Goals  Walter [name] has been advised to work up to 150 minutes of moderate intensity aerobic activity a week and strengthening exercises 2-3 times per week for cardiovascular health, weight loss maintenance and preservation of muscle mass.  He has agreed to :  {EMEXERCISE:28847::Think about enjoyable ways to increase daily physical activity and overcoming barriers to exercise,Increase physical activity in their day and reduce sedentary time (increase NEAT).,Increase volume of physical activity to a goal of 240 minutes a week,Combine aerobic and strengthening exercises for efficiency and improved cardiometabolic health.}  Medical Interventions and Pharmacotherapy  We discussed various medication options to help Walter [name] with his weight loss efforts and we both agreed to : {EMagreedrx:29170}  Associated Conditions Impacted by Obesity Treatment  Assessment & Plan Class 1 obesity due to excess calories with serious comorbidity and body mass index (BMI) of 34.0 to 34.9 in adult     Assessment and Plan Assessment & Plan       Objective   Physical Exam:  Blood pressure 136/89, pulse 75, temperature 97.6 F (36.4 C), height 5' 9 (1.753 m), weight 214 lb (97.1 kg), SpO2 100%. Body mass index is 31.6 kg/m.  General: He is overweight, cooperative, alert, well developed, and in no acute distress. PSYCH: Has normal mood, affect and thought process.   HEENT: EOMI, sclerae are anicteric. Lungs: Normal breathing effort, no conversational dyspnea. Extremities: No edema.  Neurologic: No gross sensory or motor deficits. No tremors or fasciculations noted.    Diagnostic Data Reviewed:  BMET    Component Value Date/Time   NA 137 08/01/2024 0848   NA 139 08/04/2023 1014   K 4.1 08/01/2024 0848   CL 104 08/01/2024 0848  CO2 26 08/01/2024 0848   GLUCOSE 101 (H) 08/01/2024 0848   BUN 16 08/01/2024 0848    BUN 10 08/04/2023 1014   CREATININE 1.18 08/01/2024 0848   CALCIUM  9.1 08/01/2024 0848   GFRNONAA 73 05/29/2021 0930   GFRAA 85 05/29/2021 0930   Lab Results  Component Value Date   HGBA1C 5.6 08/04/2023   HGBA1C 5.8 (H) 11/30/2017   Lab Results  Component Value Date   INSULIN  8.3 12/09/2023   Lab Results  Component Value Date   TSH 1.470 12/09/2023   CBC    Component Value Date/Time   WBC 4.3 08/01/2024 0848   RBC 4.73 08/01/2024 0848   HGB 15.7 08/01/2024 0848   HGB 15.7 08/04/2023 1014   HCT 46.7 08/01/2024 0848   HCT 47.0 08/04/2023 1014   PLT 247 08/01/2024 0848   PLT 239 08/04/2023 1014   MCV 98.7 08/01/2024 0848   MCV 97 08/04/2023 1014   MCH 33.2 (H) 08/01/2024 0848   MCHC 33.6 08/01/2024 0848   RDW 12.9 08/01/2024 0848   RDW 13.0 08/04/2023 1014   Iron Studies No results found for: IRON, TIBC, FERRITIN, IRONPCTSAT Lipid Panel     Component Value Date/Time   CHOL 134 08/01/2024 0848   CHOL 120 08/04/2023 1014   TRIG 55 08/01/2024 0848   HDL 57 08/01/2024 0848   HDL 51 08/04/2023 1014   CHOLHDL 2.4 08/01/2024 0848   VLDL 20 05/11/2017 1032   LDLCALC 63 08/01/2024 0848   Hepatic Function Panel     Component Value Date/Time   PROT 7.2 08/01/2024 0848   PROT 7.0 08/04/2023 1014   ALBUMIN 4.4 08/04/2023 1014   AST 35 08/01/2024 0848   ALT 35 08/01/2024 0848   ALKPHOS 90 08/04/2023 1014   BILITOT 0.6 08/01/2024 0848   BILITOT 0.5 08/04/2023 1014      Component Value Date/Time   TSH 1.470 12/09/2023 0910   Nutritional Lab Results  Component Value Date   VD25OH 31.5 12/09/2023    Medications: Outpatient Encounter Medications as of 08/15/2024  Medication Sig   atorvastatin  (LIPITOR) 40 MG tablet Take 1 tablet (40 mg total) by mouth daily.   bictegravir-emtricitabine -tenofovir  AF (BIKTARVY ) 50-200-25 MG TABS tablet Take 1 tablet by mouth daily.   buPROPion  ER (WELLBUTRIN  SR) 100 MG 12 hr tablet Take 1 tablet (100 mg total) by mouth  daily.   fluticasone  (FLONASE ) 50 MCG/ACT nasal spray Place 2 sprays into both nostrils daily.   levocetirizine (XYZAL  ALLERGY 24HR) 5 MG tablet Take 1 tablet (5 mg total) by mouth every evening.   phentermine  (ADIPEX-P ) 37.5 MG tablet Take 1 tablet (37.5 mg total) by mouth daily before breakfast.   No facility-administered encounter medications on file as of 08/15/2024.     Follow-Up   No follow-ups on file.SABRA He was informed of the importance of frequent follow up visits to maximize his success with intensive lifestyle modifications for his multiple health conditions.  Attestation Statement   Reviewed by clinician on day of visit: allergies, medications, problem list, medical history, surgical history, family history, social history, and previous encounter notes.     Walter Parker, MD

## 2024-08-15 NOTE — Assessment & Plan Note (Signed)
 He has been maintaining a diet low in simple and processed carbs.  He is also engaging in physical activity.  He will continue with current weight management strategy.  Consider metformin for pharmacoprophylaxis.  May also help offset the weight gain associated with antiretroviral therapy.  We have avoided metformin because of the risk of metabolic acidosis with tenofovir . Weight: decrease of 28 lb (11.6%) over 8 months, 1 week  Start: 12/09/2023 242 lb (109.8 kg)  End: 08/15/2024 214 lb (97.1 kg)

## 2024-08-15 NOTE — Progress Notes (Signed)
 Office: [phone]  /  Fax: [phone]  Weight Summary and Body Composition Analysis (BIA)  Vitals Temp: 97.6 F (36.4 C) BP: 136/89 Pulse Rate: 75 SpO2: 100 %   Anthropometric Measurements Height: 5' 9 (1.753 m) Weight: 214 lb (97.1 kg) BMI (Calculated): 31.59 Weight at Last Visit: 214 lb Weight Lost Since Last Visit: 0 lb Weight Gained Since Last Visit: 0 lb Starting Weight: 242 lb Total Weight Loss (lbs): 28 lb (12.7 kg) Peak Weight: 280 lb   Body Composition  Body Fat %: 25.5 % Fat Mass (lbs): 54.8 lbs Muscle Mass (lbs): 152 lbs Total Body Water (lbs): 109 lbs Visceral Fat Rating : 12    RMR: 2022  Today's Visit #: 11  Starting Date: 12/09/23   Subjective   Chief Complaint: Obesity  Interval History Discussed the use of AI scribe software for clinical note transcription with the patient, who gave verbal consent to proceed.  History of Present Illness Walter [name] is a 48 year old male with hypertension, prediabetes, and hyperlipidemia who presents for medical weight management.  He has maintained his weight since the last visit and adheres to a 1500 calorie nutrition plan 80% of the time. He tracks his food intake, consumes more whole foods, maintains hydration, and exercises five days a week for 60 minutes, focusing on cardio and strengthening exercises. 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  In terms of nutrition, he aims to consume more protein and has no issues with fruits and vegetables. He sometimes skips meals, particularly lunch, due to being on the go during the day, but tries to have breakfast and dinner regularly. He tends to consume more calories in the evening. He is currently taking phentermine  in the morning, which reduces his hunger, and Wellbutrin  (bupropion  SR) for emotional hunger.  He feels leaner, with looser clothing, and has  reduced his pant size from 40 to 34.  He is taking medication for cholesterol and has stopped consuming energy drinks and caffeine products that could affect his blood pressure. No issues with his current medications, including phentermine  and Wellbutrin .     Challenges affecting patient progress: none.    Pharmacotherapy for weight management: He is currently taking Bupropion  (single agent, off label use) with adequate clinical response  and without side effects. and Phentermine  (longterm use, off-label, single agent)  with adequate clinical response  and without side effects..   Assessment and Plan   Treatment Plan For Obesity:  Recommended Dietary Goals  Kolter is currently in the action stage of change. As such, his goal is to continue weight management plan. He has agreed to: incorporate 1-2 meal replacements a day for convenience  and continue current plan  Behavioral Health and Counseling  We discussed the following behavioral modification strategies today: avoiding skipping meals, continue to work on maintaining a reduced calorie state, getting the recommended amount of protein, incorporating whole foods, making healthy choices, staying well hydrated and practicing mindfulness when eating., and increase protein intake, fibrous foods (25 grams per day for women, 30 grams for men) and water to improve satiety and decrease hunger signals. .  Additional education and resources provided today: None  Recommended Physical Activity Goals  Sarp has been advised to work up to 150 minutes of moderate intensity aerobic activity a week and strengthening exercises 2-3 times per week for cardiovascular health, weight loss maintenance and preservation of muscle mass.  He has agreed to :  Increase volume of physical activity to a goal of 240 minutes a week and Combine aerobic and strengthening exercises for efficiency and improved cardiometabolic health.  Medical Interventions and  Pharmacotherapy  We discussed various medication options to help [name] with his weight loss efforts and we both agreed to : Adequate clinical response to anti-obesity medication, continue current regimen and begin monitoring blood pressure at home  Associated Conditions Impacted by Obesity Treatment  Assessment & Plan Primary hypertension Is no longer on blood pressure medications his blood pressure starting to go up.  He is currently on bupropion  and phentermine  which may affect blood pressure control.  Advised to begin monitoring at home and if his blood pressures are staying above 130/80 we will either have to dial back sympathomimetics or start blood pressure medication.  He is getting pretty close to his maintenance phase so we may be able to reduce phentermine  if blood pressure becomes a problem Class 1 obesity with serious comorbidity and body mass index (BMI) of 31.0 to 31.9 in adult, unspecified obesity type Other disorder of eating -emotional hunger Weight: decrease of 28 lb (11.6%) over 8 months, 1 week  Start: 12/09/2023 242 lb (109.8 kg)  End: 08/15/2024 214 lb (97.1 kg)   Obesity with emotional eating is being managed with a 1500 calorie nutrition plan, regular exercise, and medications (phentermine  and bupropion ). Body composition has improved with increased muscle mass and decreased body fat percentage. Current body fat percentage is 25%, down from 31-32%. Visceral fat rating has decreased from 16-17 to 12. Emotional hunger is being managed with bupropion , which has been effective in reducing cravings. He is advised to focus on body composition rather than BMI due to high muscle mass. - Continue phentermine  and bupropion  as prescribed. - Encourage maintaining a 1500 calorie nutrition plan and regular exercise (240 minutes per week). - Advise against skipping meals to prevent muscle loss and ensure adequate protein intake. - Recommend protein bar or shake with fruit in the morning  if not hungry. - Reinforce the importance of tracking and self-monitoring to adjust physical activity and nutrition as needed. - His goal weight is  210 Prediabetes He has been maintaining a diet low in simple and processed carbs.  He is also engaging in physical activity.  He will continue with current weight management strategy.  Consider metformin for pharmacoprophylaxis.  May also help offset the weight gain associated with antiretroviral therapy.  We have avoided metformin because of the risk of metabolic acidosis with tenofovir . Weight: decrease of 28 lb (11.6%) over 8 months, 1 week  Start: 12/09/2023 242 lb (109.8 kg)  End: 08/15/2024 214 lb (97.1 kg)     PDMP reviewed during this encounter.   Objective   Physical Exam:  Blood pressure 136/89, pulse 75, temperature 97.6 F (36.4 C), height 5' 9 (1.753 m), weight 214 lb (97.1 kg), SpO2 100%. Body mass index is 31.6 kg/m.  General: He is overweight, cooperative, alert, well developed, and in no acute distress. PSYCH: Has normal mood, affect and thought process.   HEENT: EOMI, sclerae are anicteric. Lungs: Normal breathing effort, no conversational dyspnea. Extremities: No edema.  Neurologic: No gross sensory or motor deficits. No tremors or fasciculations noted.    Diagnostic Data Reviewed:  BMET    Component Value Date/Time   NA 137 08/01/2024 0848   NA 139 08/04/2023 1014   K 4.1 08/01/2024 0848   CL 104 08/01/2024 0848   CO2 26  08/01/2024 0848   GLUCOSE 101 (H) 08/01/2024 0848   BUN 16 08/01/2024 0848   BUN 10 08/04/2023 1014   CREATININE 1.18 08/01/2024 0848   CALCIUM  9.1 08/01/2024 0848   GFRNONAA 73 05/29/2021 0930   GFRAA 85 05/29/2021 0930   Lab Results  Component Value Date   HGBA1C 5.6 08/04/2023   HGBA1C 5.8 (H) 11/30/2017   Lab Results  Component Value Date   INSULIN  8.3 12/09/2023   Lab Results  Component Value Date   TSH 1.470 12/09/2023   CBC    Component Value Date/Time   WBC 4.3  08/01/2024 0848   RBC 4.73 08/01/2024 0848   HGB 15.7 08/01/2024 0848   HGB 15.7 08/04/2023 1014   HCT 46.7 08/01/2024 0848   HCT 47.0 08/04/2023 1014   PLT 247 08/01/2024 0848   PLT 239 08/04/2023 1014   MCV 98.7 08/01/2024 0848   MCV 97 08/04/2023 1014   MCH 33.2 (H) 08/01/2024 0848   MCHC 33.6 08/01/2024 0848   RDW 12.9 08/01/2024 0848   RDW 13.0 08/04/2023 1014   Iron Studies No results found for: IRON, TIBC, FERRITIN, IRONPCTSAT Lipid Panel     Component Value Date/Time   CHOL 134 08/01/2024 0848   CHOL 120 08/04/2023 1014   TRIG 55 08/01/2024 0848   HDL 57 08/01/2024 0848   HDL 51 08/04/2023 1014   CHOLHDL 2.4 08/01/2024 0848   VLDL 20 05/11/2017 1032   LDLCALC 63 08/01/2024 0848   Hepatic Function Panel     Component Value Date/Time   PROT 7.2 08/01/2024 0848   PROT 7.0 08/04/2023 1014   ALBUMIN 4.4 08/04/2023 1014   AST 35 08/01/2024 0848   ALT 35 08/01/2024 0848   ALKPHOS 90 08/04/2023 1014   BILITOT 0.6 08/01/2024 0848   BILITOT 0.5 08/04/2023 1014      Component Value Date/Time   TSH 1.470 12/09/2023 0910   Nutritional Lab Results  Component Value Date   VD25OH 31.5 12/09/2023    Medications: Outpatient Encounter Medications as of 08/15/2024  Medication Sig   atorvastatin  (LIPITOR) 40 MG tablet Take 1 tablet (40 mg total) by mouth daily.   bictegravir-emtricitabine -tenofovir  AF (BIKTARVY ) 50-200-25 MG TABS tablet Take 1 tablet by mouth daily.   buPROPion  ER (WELLBUTRIN  SR) 100 MG 12 hr tablet Take 1 tablet (100 mg total) by mouth daily.   fluticasone  (FLONASE ) 50 MCG/ACT nasal spray Place 2 sprays into both nostrils daily.   levocetirizine (XYZAL  ALLERGY 24HR) 5 MG tablet Take 1 tablet (5 mg total) by mouth every evening.   [DISCONTINUED] phentermine  (ADIPEX-P ) 37.5 MG tablet Take 1 tablet (37.5 mg total) by mouth daily before breakfast.   phentermine  (ADIPEX-P ) 37.5 MG tablet Take 1 tablet (37.5 mg total) by mouth daily before breakfast.    No facility-administered encounter medications on file as of 08/15/2024.     Follow-Up   Return in about 4 weeks (around 09/12/2024) for For Weight Mangement with Dr. Francyne.SABRA He was informed of the importance of frequent follow up visits to maximize his success with intensive lifestyle modifications for his multiple health conditions.  Attestation Statement   Reviewed by clinician on day of visit: allergies, medications, problem list, medical history, surgical history, family history, social history, and previous encounter notes.     Lucas Francyne, MD

## 2024-08-15 NOTE — Assessment & Plan Note (Signed)
 Is no longer on blood pressure medications his blood pressure starting to go up.  He is currently on bupropion  and phentermine  which may affect blood pressure control.  Advised to begin monitoring at home and if his blood pressures are staying above 130/80 we will either have to dial back sympathomimetics or start blood pressure medication.  He is getting pretty close to his maintenance phase so we may be able to reduce phentermine  if blood pressure becomes a problem

## 2024-08-22 ENCOUNTER — Other Ambulatory Visit (HOSPITAL_COMMUNITY): Payer: Self-pay

## 2024-08-24 ENCOUNTER — Encounter (INDEPENDENT_AMBULATORY_CARE_PROVIDER_SITE_OTHER): Payer: Self-pay

## 2024-08-24 ENCOUNTER — Other Ambulatory Visit: Payer: Self-pay | Admitting: Pharmacy Technician

## 2024-08-24 ENCOUNTER — Other Ambulatory Visit: Payer: Self-pay

## 2024-08-24 NOTE — Progress Notes (Signed)
 Specialty Pharmacy Refill Coordination Note  Walter [name] is a 48 y.o. male contacted today regarding refills of specialty medication(s) Bictegravir-Emtricitab-Tenofov (Biktarvy )   Patient requested (Patient-Rptd) Delivery   Delivery date: 08/30/24 Verified address: (Patient-Rptd) [address] [place] [address]   Medication will be filled on 08/29/24.

## 2024-08-29 ENCOUNTER — Other Ambulatory Visit (HOSPITAL_COMMUNITY): Payer: Self-pay

## 2024-08-29 ENCOUNTER — Other Ambulatory Visit: Payer: Self-pay

## 2024-09-13 ENCOUNTER — Other Ambulatory Visit (HOSPITAL_COMMUNITY): Payer: Self-pay

## 2024-09-13 ENCOUNTER — Encounter (INDEPENDENT_AMBULATORY_CARE_PROVIDER_SITE_OTHER): Payer: Self-pay | Admitting: Internal Medicine

## 2024-09-13 ENCOUNTER — Ambulatory Visit (INDEPENDENT_AMBULATORY_CARE_PROVIDER_SITE_OTHER): Admitting: Internal Medicine

## 2024-09-13 ENCOUNTER — Other Ambulatory Visit: Payer: Self-pay

## 2024-09-13 VITALS — BP 127/80 | HR 82 | Temp 98.2°F | Ht 69.0 in | Wt 215.0 lb

## 2024-09-13 DIAGNOSIS — R7303 Prediabetes: Secondary | ICD-10-CM

## 2024-09-13 DIAGNOSIS — B20 Human immunodeficiency virus [HIV] disease: Secondary | ICD-10-CM | POA: Diagnosis not present

## 2024-09-13 DIAGNOSIS — I10 Essential (primary) hypertension: Secondary | ICD-10-CM

## 2024-09-13 DIAGNOSIS — E66811 Obesity, class 1: Secondary | ICD-10-CM

## 2024-09-13 DIAGNOSIS — F5089 Other specified eating disorder: Secondary | ICD-10-CM | POA: Diagnosis not present

## 2024-09-13 DIAGNOSIS — Z6831 Body mass index (BMI) 31.0-31.9, adult: Secondary | ICD-10-CM

## 2024-09-13 MED ORDER — BUPROPION HCL ER (SR) 100 MG PO TB12
100.0000 mg | ORAL_TABLET | Freq: Every day | ORAL | 0 refills | Status: DC
  Filled 2024-09-13: qty 30, 30d supply, fill #0
  Filled 2024-10-16: qty 30, 30d supply, fill #1

## 2024-09-13 MED ORDER — PHENTERMINE HCL 37.5 MG PO TABS
37.5000 mg | ORAL_TABLET | Freq: Every day | ORAL | 0 refills | Status: DC
Start: 2024-09-13 — End: 2024-10-11
  Filled 2024-09-13 – 2024-10-04 (×3): qty 30, 30d supply, fill #0

## 2024-09-13 NOTE — Assessment & Plan Note (Signed)
 His fasting blood glucose today , and A1c was not done.  This will be completed at the next office visit.  Continue current weight management strategy.  He will continue to work on reducing simple sugars in his diet.

## 2024-09-13 NOTE — Assessment & Plan Note (Signed)
 His blood pressure has not been an issue anymore since he lost weight he is no longer on blood pressure medications.  He is on bupropion  and phentermine  for control of impaired satiety and hunger with good results.  His blood pressure and heart rate are well-controlled continue current regimen

## 2024-09-13 NOTE — Assessment & Plan Note (Signed)
 Reviewed recent labs ordered by infectious disease he has an undetectable viral load with a CD4 count of 480 which has improved.  He is currently HAART without any reported side effects

## 2024-09-13 NOTE — Assessment & Plan Note (Signed)
 Improved on phentermine  and Wellbutrin  continue current regimen

## 2024-09-13 NOTE — Progress Notes (Signed)
 Office: [phone]  /  Fax: [phone]  Weight Summary and Body Composition Analysis (BIA)  Vitals Temp: 98.2 F (36.8 C) BP: 127/80 Pulse Rate: 82 SpO2: 99 %   Anthropometric Measurements Height: 5' 9 (1.753 m) Weight: 215 lb (97.5 kg) BMI (Calculated): 31.74 Weight at Last Visit: 214lb Weight Lost Since Last Visit: 0lb Weight Gained Since Last Visit: 1lb Starting Weight: 242lb Total Weight Loss (lbs): 27 lb (12.2 kg) Peak Weight: 280lb   Body Composition  Body Fat %: 26.3 % Fat Mass (lbs): 56.6 lbs Muscle Mass (lbs): 150.8 lbs Total Body Water (lbs): 110.4 lbs Visceral Fat Rating : 13    RMR: 2002  Today's Visit #: 12  Starting Date: 12/09/23   Subjective   Chief Complaint: Obesity  Interval History Discussed the use of AI scribe software for clinical note transcription with the patient, who gave verbal consent to proceed.  History of Present Illness Walter [name] is a 48 year old male with HIV who presents for obesity and weight management.  He has gained one pound but has noticed a reduction in clothing size. His clothes, including a medium shirt, fit more loosely. He aims for a caloric intake of 1500 calories per day, achieving this about 80% of the time. He has increased carbohydrate intake, particularly from potatoes and rice, which he believes has contributed to a slowdown in weight loss.  His part-time job affects his eating schedule, leading him to eat one large meal that keeps him full for a long time, supplemented by water throughout the day. He experiences hunger late at night after work. He has no cravings since starting a new medication, which he did not specify, but notes a shift in macronutrient intake with increased carbohydrates. He is aware that carbohydrates drive insulin  production, which can slow metabolism, especially with his HIV medications contributing to insulin  resistance.  He reports his body fat percentage is currently  25-26%. He has maintained his weight over the last three to four months. He has been focused on reducing caffeine intake, which has positively impacted his blood pressure.  His physical activity has increased, and he is aware of the need to maintain intensity during workouts. He is mindful of the impact of socialization on his diet, particularly alcohol consumption. He is on antiretroviral therapy for HIV, which interacts with alcohol and other substances, and he is cautious about maintaining the effectiveness of his medication regimen.     Challenges affecting patient progress: recent lapse in weight management plan due to work, travel, illness or family circumstances.    Pharmacotherapy for weight management: He is currently taking Bupropion  (single agent, off label use) with adequate clinical response  and without side effects. and Phentermine  (longterm use, off-label, single agent)  with adequate clinical response  and without side effects..   Assessment and Plan   Treatment Plan For Obesity:  Recommended Dietary Goals  Walter [name] is currently in the action stage of change. As such, his goal is to continue weight management plan. He has agreed to: keep a food journal with a target of  1500 calories per day and 90-120 grams of protein per day or 30-40 grams per meal., continue current plan, and continue to work on implementation of reduced calorie nutrition plan (RCNP)  Behavioral Health and Counseling  We discussed the following behavioral modification strategies today: increasing lean protein intake to established goals, decreasing simple carbohydrates , increasing vegetables, avoiding skipping meals, increasing water intake , work on meal planning  and preparation, work on tracking and journaling calories using tracking application, keeping healthy foods at home, continue to work on maintaining a reduced calorie state, getting the recommended amount of protein, incorporating whole foods,  making healthy choices, staying well hydrated and practicing mindfulness when eating., and increase protein intake, fibrous foods (25 grams per day for women, 30 grams for men) and water to improve satiety and decrease hunger signals. .  Additional education and resources provided today: None  Recommended Physical Activity Goals  Walter [name] has been advised to work up to 150 minutes of moderate intensity aerobic activity a week and strengthening exercises 2-3 times per week for cardiovascular health, weight loss maintenance and preservation of muscle mass.  He has agreed to :  Discussed changing exercise routine to avoid adaptation plateau or training plateau, Increase volume of physical activity to a goal of 240 minutes a week, and Combine aerobic and strengthening exercises for efficiency and improved cardiometabolic health.  Medical Interventions and Pharmacotherapy  We discussed various medication options to help Walter [name] with his weight loss efforts and we both agreed to : Adequate clinical response to anti-obesity medication, continue current regimen  Associated Conditions Impacted by Obesity Treatment  Assessment & Plan Class 1 obesity with serious comorbidity and body mass index (BMI) of 31.0 to 31.9 in adult, unspecified obesity type Weight: decrease of 28 lb (11.6%) over 8 months, 1 week  Start: 12/09/2023 242 lb (109.8 kg)  End: 08/15/2024 214 lb (97.1 kg)   Weight has been maintained over the last 3-4 months with a slight gain of one pound, not considered significant. He is experiencing a slowdown in weight loss, potentially due to increased carbohydrate intake and portion sizes. Body fat percentage is currently 25-26% with a goal of less than 20-22%. He is losing inches, indicating fat loss despite stable weight. No evidence of a definite plateau, but adjustments are needed in diet and exercise. - Track and journal dietary intake for 3 days over 2 weeks to calibrate calorie intake and  macronutrient distribution. - Aim for 1500 calories per day with 30% from protein, 40% from carbohydrates, and the remainder from healthy fats. - Ensure protein intake of 90-120 grams per day. - Limit carbohydrate intake to 120 grams per day, focusing on complex carbohydrates. - Increase physical activity intensity if workouts are not challenging. - Reassess in 4 weeks to determine if further metabolic testing is needed. Other disorder of eating Improved on phentermine  and Wellbutrin  continue current regimen Primary hypertension His blood pressure has not been an issue anymore since he lost weight he is no longer on blood pressure medications.  He is on bupropion  and phentermine  for control of impaired satiety and hunger with good results.  His blood pressure and heart rate are well-controlled continue current regimen Prediabetes His fasting blood glucose today , and A1c was not done.  This will be completed at the next office visit.  Continue current weight management strategy.  He will continue to work on reducing simple sugars in his diet.  HIV disease (HCC) Reviewed recent labs ordered by infectious disease he has an undetectable viral load with a CD4 count of 480 which has improved.  He is currently HAART without any reported side effects        Objective   Physical Exam:  Blood pressure 127/80, pulse 82, temperature 98.2 F (36.8 C), height 5' 9 (1.753 m), weight 215 lb (97.5 kg), SpO2 99%. Body mass index is 31.75 kg/m.  General: He is  overweight, cooperative, alert, well developed, and in no acute distress. PSYCH: Has normal mood, affect and thought process.   HEENT: EOMI, sclerae are anicteric. Lungs: Normal breathing effort, no conversational dyspnea. Extremities: No edema.  Neurologic: No gross sensory or motor deficits. No tremors or fasciculations noted.    Diagnostic Data Reviewed:  BMET    Component Value Date/Time   NA 137 08/01/2024 0848   NA 139 08/04/2023  1014   K 4.1 08/01/2024 0848   CL 104 08/01/2024 0848   CO2 26 08/01/2024 0848   GLUCOSE 101 (H) 08/01/2024 0848   BUN 16 08/01/2024 0848   BUN 10 08/04/2023 1014   CREATININE 1.18 08/01/2024 0848   CALCIUM  9.1 08/01/2024 0848   GFRNONAA 73 05/29/2021 0930   GFRAA 85 05/29/2021 0930   Lab Results  Component Value Date   HGBA1C 5.6 08/04/2023   HGBA1C 5.8 (H) 11/30/2017   Lab Results  Component Value Date   INSULIN  8.3 12/09/2023   Lab Results  Component Value Date   TSH 1.470 12/09/2023   CBC    Component Value Date/Time   WBC 4.3 08/01/2024 0848   RBC 4.73 08/01/2024 0848   HGB 15.7 08/01/2024 0848   HGB 15.7 08/04/2023 1014   HCT 46.7 08/01/2024 0848   HCT 47.0 08/04/2023 1014   PLT 247 08/01/2024 0848   PLT 239 08/04/2023 1014   MCV 98.7 08/01/2024 0848   MCV 97 08/04/2023 1014   MCH 33.2 (H) 08/01/2024 0848   MCHC 33.6 08/01/2024 0848   RDW 12.9 08/01/2024 0848   RDW 13.0 08/04/2023 1014   Iron Studies No results found for: IRON, TIBC, FERRITIN, IRONPCTSAT Lipid Panel     Component Value Date/Time   CHOL 134 08/01/2024 0848   CHOL 120 08/04/2023 1014   TRIG 55 08/01/2024 0848   HDL 57 08/01/2024 0848   HDL 51 08/04/2023 1014   CHOLHDL 2.4 08/01/2024 0848   VLDL 20 05/11/2017 1032   LDLCALC 63 08/01/2024 0848   Hepatic Function Panel     Component Value Date/Time   PROT 7.2 08/01/2024 0848   PROT 7.0 08/04/2023 1014   ALBUMIN 4.4 08/04/2023 1014   AST 35 08/01/2024 0848   ALT 35 08/01/2024 0848   ALKPHOS 90 08/04/2023 1014   BILITOT 0.6 08/01/2024 0848   BILITOT 0.5 08/04/2023 1014      Component Value Date/Time   TSH 1.470 12/09/2023 0910   Nutritional Lab Results  Component Value Date   VD25OH 31.5 12/09/2023    Medications: Outpatient Encounter Medications as of 09/13/2024  Medication Sig   atorvastatin  (LIPITOR) 40 MG tablet Take 1 tablet (40 mg total) by mouth daily.   bictegravir-emtricitabine -tenofovir  AF (BIKTARVY )  50-200-25 MG TABS tablet Take 1 tablet by mouth daily.   fluticasone  (FLONASE ) 50 MCG/ACT nasal spray Place 2 sprays into both nostrils daily.   levocetirizine (XYZAL  ALLERGY 24HR) 5 MG tablet Take 1 tablet (5 mg total) by mouth every evening.   [DISCONTINUED] buPROPion  ER (WELLBUTRIN  SR) 100 MG 12 hr tablet Take 1 tablet (100 mg total) by mouth daily.   [DISCONTINUED] phentermine  (ADIPEX-P ) 37.5 MG tablet Take 1 tablet (37.5 mg total) by mouth daily before breakfast.   buPROPion  ER (WELLBUTRIN  SR) 100 MG 12 hr tablet Take 1 tablet (100 mg total) by mouth daily.   phentermine  (ADIPEX-P ) 37.5 MG tablet Take 1 tablet (37.5 mg total) by mouth daily before breakfast.   No facility-administered encounter medications on file as of 09/13/2024.  Follow-Up   Return in about 4 weeks (around 10/11/2024) for For Weight Mangement with Dr. Francyne.SABRA He was informed of the importance of frequent follow up visits to maximize his success with intensive lifestyle modifications for his multiple health conditions.  Attestation Statement   Reviewed by clinician on day of visit: allergies, medications, problem list, medical history, surgical history, family history, social history, and previous encounter notes.     Lucas Francyne, MD

## 2024-09-13 NOTE — Assessment & Plan Note (Signed)
 Weight: decrease of 28 lb (11.6%) over 8 months, 1 week  Start: 12/09/2023 242 lb (109.8 kg)  End: 08/15/2024 214 lb (97.1 kg)   Weight has been maintained over the last 3-4 months with a slight gain of one pound, not considered significant. He is experiencing a slowdown in weight loss, potentially due to increased carbohydrate intake and portion sizes. Body fat percentage is currently 25-26% with a goal of less than 20-22%. He is losing inches, indicating fat loss despite stable weight. No evidence of a definite plateau, but adjustments are needed in diet and exercise. - Track and journal dietary intake for 3 days over 2 weeks to calibrate calorie intake and macronutrient distribution. - Aim for 1500 calories per day with 30% from protein, 40% from carbohydrates, and the remainder from healthy fats. - Ensure protein intake of 90-120 grams per day. - Limit carbohydrate intake to 120 grams per day, focusing on complex carbohydrates. - Increase physical activity intensity if workouts are not challenging. - Reassess in 4 weeks to determine if further metabolic testing is needed.

## 2024-09-21 ENCOUNTER — Other Ambulatory Visit (HOSPITAL_COMMUNITY): Payer: Self-pay

## 2024-09-21 ENCOUNTER — Other Ambulatory Visit: Payer: Self-pay | Admitting: Infectious Disease

## 2024-09-21 ENCOUNTER — Other Ambulatory Visit: Payer: Self-pay

## 2024-09-21 DIAGNOSIS — B20 Human immunodeficiency virus [HIV] disease: Secondary | ICD-10-CM

## 2024-09-21 MED ORDER — BIKTARVY 50-200-25 MG PO TABS
1.0000 | ORAL_TABLET | Freq: Every day | ORAL | 11 refills | Status: DC
  Filled 2024-09-21 – 2024-09-23 (×2): qty 30, 30d supply, fill #0

## 2024-09-22 NOTE — Progress Notes (Deleted)
 Virtual Visit via Video Note  I connected with Walter [name] on 09/25/24 at 11:30 AM EST by a video enabled telemedicine application and verified that I am speaking with the correct person using two identifiers.  Location: Patient: Home Provider: [facility]   I discussed the limitations of evaluation and management by telemedicine and the availability of in person appointments. The patient expressed understanding and agreed to proceed.  History of Present Illness:  Past Medical History:  Diagnosis Date   Acute esophagitis 10/08/2015   AIDS (HCC) 10/08/2015   COVID-19 virus infection 12/19/2019   Essential hypertension    Hyperglycemia 11/30/2017   Hyperlipidemia 12/14/2022   Prediabetes 02/2019   Screen for STD (sexually transmitted disease) 01/23/2016   Seasonal allergies    Vaccine counseling 10/08/2023   Weight gain 12/14/2018    No past surgical history on file.  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  Tobacco Use   Smoking status: Never   Smokeless tobacco: Never  Vaping Use   Vaping status: Never Used  Substance and Sexual Activity   Alcohol use: Yes    Comment: occ   Drug use: No   Sexual activity: Not Currently    Partners: Male    Comment: declined condoms  Other Topics Concern   Not on file  Social History Narrative   Not on file   Social Drivers of Health   Financial Resource Strain: Medium Risk  (02/22/2024)   Overall Financial Resource Strain (CARDIA)    Difficulty of Paying Living Expenses: Somewhat hard  Food Insecurity: Food Insecurity Present (02/22/2024)   Hunger Vital Sign    Worried About Running Out of Food in the Last Year: Never true    Ran Out of Food in the Last Year: Sometimes true  Transportation Needs: No Transportation Needs (02/22/2024)   PRAPARE - Administrator, Civil Service (Medical): No    Lack of Transportation (Non-Medical): No  Physical Activity: Sufficiently Active (02/22/2024)   Exercise Vital Sign    Days of Exercise per Week: 6 days    Minutes of Exercise per Session: 60 min  Stress: No Stress Concern Present (02/22/2024)   [employer] of Occupational Health - Occupational Stress Questionnaire    Feeling of Stress : Not at all  Social Connections: Moderately Integrated (02/22/2024)   Social Connection and Isolation Panel    Frequency of Communication with Friends and Family: More than three times a week    Frequency of Social Gatherings with Friends and Family: More than three times a week    Attends Religious Services: More than 4 times per year    Active Member of [employer] or Organizations: Yes    Attends Banker Meetings: More than 4 times per year    Marital Status: Never  married    No Known Allergies   Current Outpatient Medications:    atorvastatin  (LIPITOR) 40 MG tablet, Take 1 tablet (40 mg total) by mouth daily., Disp: 30 tablet, Rfl: 11   bictegravir-emtricitabine -tenofovir  AF (BIKTARVY ) 50-200-25 MG TABS tablet, Take 1 tablet by mouth daily., Disp: 30 tablet, Rfl: 11   buPROPion  ER (WELLBUTRIN  SR) 100 MG 12 hr tablet, Take 1 tablet (100 mg total) by mouth daily., Disp: 90 tablet, Rfl: 0   fluticasone  (FLONASE ) 50 MCG/ACT nasal spray, Place 2 sprays into both nostrils daily., Disp: 16 g, Rfl: 6   levocetirizine (XYZAL  ALLERGY 24HR) 5 MG tablet, Take 1 tablet (5 mg total) by mouth every evening., Disp: 90 tablet,  Rfl: 1   phentermine  (ADIPEX-P ) 37.5 MG tablet, Take 1 tablet (37.5 mg total) by mouth daily before breakfast., Disp: 30 tablet, Rfl: 0    Observations/Objective:   Assessment and Plan:   Follow Up Instructions:    I discussed the assessment and treatment plan with the patient. The patient was provided an opportunity to ask questions and all were answered. The patient agreed with the plan and demonstrated an understanding of the instructions.   The patient was advised to call back or seek an in-person evaluation if the symptoms worsen or if the condition fails to improve as anticipated.  I provided *** minutes of non-face-to-face time during this encounter.   Jomarie Fleeta Rothman, MD

## 2024-09-23 ENCOUNTER — Other Ambulatory Visit (HOSPITAL_COMMUNITY): Payer: Self-pay

## 2024-09-23 ENCOUNTER — Other Ambulatory Visit: Payer: Self-pay

## 2024-09-26 ENCOUNTER — Telehealth: Payer: Self-pay | Admitting: Infectious Disease

## 2024-09-26 ENCOUNTER — Telehealth: Admitting: Infectious Disease

## 2024-09-26 ENCOUNTER — Other Ambulatory Visit: Payer: Self-pay

## 2024-09-26 DIAGNOSIS — R7303 Prediabetes: Secondary | ICD-10-CM

## 2024-09-26 DIAGNOSIS — Z7185 Encounter for immunization safety counseling: Secondary | ICD-10-CM

## 2024-09-26 DIAGNOSIS — B20 Human immunodeficiency virus [HIV] disease: Secondary | ICD-10-CM

## 2024-09-26 DIAGNOSIS — E66811 Obesity, class 1: Secondary | ICD-10-CM

## 2024-09-26 DIAGNOSIS — E669 Obesity, unspecified: Secondary | ICD-10-CM

## 2024-09-26 DIAGNOSIS — I10 Essential (primary) hypertension: Secondary | ICD-10-CM

## 2024-09-27 ENCOUNTER — Encounter: Payer: Self-pay | Admitting: Infectious Disease

## 2024-09-27 ENCOUNTER — Other Ambulatory Visit: Payer: Self-pay

## 2024-09-27 ENCOUNTER — Encounter (INDEPENDENT_AMBULATORY_CARE_PROVIDER_SITE_OTHER): Payer: Self-pay

## 2024-09-27 ENCOUNTER — Other Ambulatory Visit: Payer: Self-pay | Admitting: Pharmacy Technician

## 2024-09-27 NOTE — Progress Notes (Signed)
 Specialty Pharmacy Refill Coordination Note  JONI NORROD is a 48 y.o. male contacted today regarding refills of specialty medication(s) Bictegravir-Emtricitab-Tenofov (Biktarvy )   Patient requested (Patient-Rptd) Delivery   Delivery date: 09/30/2024 Verified address: (Patient-Rptd) [name] [place] [address]   Medication will be filled on:09/29/2024

## 2024-09-28 ENCOUNTER — Other Ambulatory Visit: Payer: Self-pay

## 2024-09-29 ENCOUNTER — Other Ambulatory Visit: Payer: Self-pay | Admitting: Infectious Disease

## 2024-09-29 ENCOUNTER — Other Ambulatory Visit (HOSPITAL_COMMUNITY): Payer: Self-pay

## 2024-09-29 ENCOUNTER — Other Ambulatory Visit: Payer: Self-pay

## 2024-09-29 MED ORDER — DOXYCYCLINE HYCLATE 100 MG PO TABS
ORAL_TABLET | ORAL | 5 refills | Status: DC
  Filled 2024-09-29 – 2024-09-30 (×2): qty 60, 30d supply, fill #0

## 2024-09-30 ENCOUNTER — Other Ambulatory Visit: Payer: Self-pay

## 2024-09-30 ENCOUNTER — Telehealth: Payer: Self-pay | Admitting: Family Medicine

## 2024-09-30 ENCOUNTER — Other Ambulatory Visit (HOSPITAL_COMMUNITY): Payer: Self-pay

## 2024-09-30 NOTE — Telephone Encounter (Signed)
 Pt confirmed appt

## 2024-10-03 ENCOUNTER — Ambulatory Visit: Attending: Family Medicine | Admitting: Family Medicine

## 2024-10-03 ENCOUNTER — Encounter: Payer: Self-pay | Admitting: Family Medicine

## 2024-10-03 VITALS — BP 130/83 | HR 79 | Temp 98.2°F | Ht 69.0 in | Wt 220.2 lb

## 2024-10-03 DIAGNOSIS — Z1211 Encounter for screening for malignant neoplasm of colon: Secondary | ICD-10-CM | POA: Diagnosis not present

## 2024-10-03 DIAGNOSIS — M79674 Pain in right toe(s): Secondary | ICD-10-CM

## 2024-10-03 NOTE — Patient Instructions (Signed)
 VISIT SUMMARY:  You came in today because you were concerned about swelling in your right toe, which you suspect might be gout. You have experienced this swelling twice, but it has since resolved. You have a family history of gout and have been using tart cherry juice to manage your symptoms, which has helped. Currently, you are not experiencing any symptoms.  YOUR PLAN:  -SUSPECTED GOUT WITH RESOLVED RIGHT TOE PAIN: Gout is a type of arthritis caused by a buildup of uric acid crystals in the joints, leading to swelling and pain. You have experienced intermittent swelling and tenderness in your right toe, which has resolved with the use of tart cherry juice. We have ordered a uric acid level test to confirm the diagnosis. In the meantime, you should avoid dietary triggers such as red meat, wine, and seafood. We will discuss further management options if your uric acid level is elevated.  -COLORECTAL CANCER SCREENING: Colorectal cancer screening is important for early detection and prevention of colon cancer. You missed your previous colonoscopy due to a communication issue. We have placed a referral for you to get a colonoscopy done.  INSTRUCTIONS:  Please go to the lab to have your uric acid level tested. Avoid red meat, wine, and seafood to help manage potential gout symptoms. Follow up with the referral for your colonoscopy to complete your colorectal cancer screening.

## 2024-10-03 NOTE — Progress Notes (Signed)
 Subjective:  Patient ID: Walter [name], male    DOB: 1976-07-11  Age: 48 y.o. MRN: [id number]  CC: Gout (Possible gout in big toe.)     Discussed the use of AI scribe software for clinical note transcription with the patient, who gave verbal consent to proceed.  History of Present Illness Walter [name] is a 48 year old male with a history of HIV managed with Biktarvy ,hypertension, prediabetes who presents with a history of toe swelling, suspecting gout.  He has experienced swelling in his right toe, significant enough to require wearing a bedroom shoe due to tenderness and swelling. The swelling has since resolved. He has a family history of gout. He tried a home remedy using tart cherry juice daily, which improved his symptoms. He is currently asymptomatic but is concerned about the possibility of gout.    Past Medical History:  Diagnosis Date   Acute esophagitis 10/08/2015   AIDS (HCC) 10/08/2015   COVID-19 virus infection 12/19/2019   Essential hypertension    Hyperglycemia 11/30/2017   Hyperlipidemia 12/14/2022   Prediabetes 02/2019   Screen for STD (sexually transmitted disease) 01/23/2016   Seasonal allergies    Vaccine counseling 10/08/2023   Weight gain 12/14/2018    History reviewed. No pertinent surgical history.  Family History  Problem Relation Age of Onset   Hypertension Mother    High Cholesterol Mother    Thyroid  disease Mother    Cancer Mother    Depression Mother    Anxiety disorder Mother    Alcoholism Mother    Obesity Mother    Diabetes Father    High blood pressure Father    Obesity Father     Social History   Socioeconomic History   Marital status: Single    Spouse name: Not on file   Number of children: Not on file   Years of education: Not on file   Highest education level: Associate degree: occupational, scientist, product/process development, or vocational program  Occupational History   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   Tobacco Use   Smoking status: Never   Smokeless tobacco: Never  Vaping Use   Vaping status: Never Used  Substance and Sexual Activity   Alcohol use: Yes    Comment: occ   Drug use: No   Sexual activity: Not Currently    Partners: Male    Comment: declined condoms  Other Topics Concern   Not on file  Social History Narrative   Not on file   Social Drivers of Health   Financial Resource Strain: Medium Risk (09/29/2024)   Overall Financial Resource Strain (CARDIA)    Difficulty of Paying Living Expenses: Somewhat hard  Food Insecurity: Food Insecurity Present (09/29/2024)   Hunger Vital Sign    Worried About Running Out of Food in the Last Year: Sometimes true    Ran Out of Food in the Last Year: Sometimes true  Transportation Needs: No Transportation Needs (09/29/2024)   PRAPARE - Administrator, Civil Service (Medical): No    Lack of Transportation (Non-Medical): No  Physical Activity: Sufficiently Active (09/29/2024)   Exercise Vital Sign    Days of Exercise per Week: 5 days    Minutes of Exercise per Session: 60 min  Stress: No Stress Concern Present (09/29/2024)   [employer] of Occupational Health - Occupational Stress Questionnaire    Feeling of Stress: Not at all  Social Connections: Unknown (09/29/2024)   Social Connection and Isolation Panel  Frequency of Communication with Friends and Family: More than three times a week    Frequency of Social Gatherings with Friends and Family: Three times a week    Attends Religious Services: More than 4 times per year    Active Member of Clubs or Organizations: Yes    Attends Banker Meetings: More than 4 times per year    Marital Status: Patient declined    No Known Allergies  Outpatient Medications Prior to Visit  Medication Sig Dispense Refill   atorvastatin  (LIPITOR) 40 MG tablet Take 1 tablet (40 mg total) by mouth daily. 30 tablet 11   bictegravir-emtricitabine -tenofovir  AF (BIKTARVY )  50-200-25 MG TABS tablet Take 1 tablet by mouth daily. 30 tablet 11   buPROPion  ER (WELLBUTRIN  SR) 100 MG 12 hr tablet Take 1 tablet (100 mg total) by mouth daily. 90 tablet 0   doxycycline (VIBRA-TABS) 100 MG tablet Take two tablets by mouth after sex to prevent STI 60 tablet 5   levocetirizine (XYZAL  ALLERGY 24HR) 5 MG tablet Take 1 tablet (5 mg total) by mouth every evening. 90 tablet 1   phentermine  (ADIPEX-P ) 37.5 MG tablet Take 1 tablet (37.5 mg total) by mouth daily before breakfast. 30 tablet 0   fluticasone  (FLONASE ) 50 MCG/ACT nasal spray Place 2 sprays into both nostrils daily. 16 g 6   No facility-administered medications prior to visit.     ROS Review of Systems  Constitutional:  Negative for activity change and appetite change.  HENT:  Negative for sinus pressure and sore throat.   Respiratory:  Negative for chest tightness, shortness of breath and wheezing.   Cardiovascular:  Negative for chest pain and palpitations.  Gastrointestinal:  Negative for abdominal distention, abdominal pain and constipation.  Genitourinary: Negative.   Musculoskeletal:        See HPI  Psychiatric/Behavioral:  Negative for behavioral problems and dysphoric mood.     Objective:  BP 130/83   Pulse 79   Temp 98.2 F (36.8 C) (Oral)   Ht 5' 9 (1.753 m)   Wt 220 lb 3.2 oz (99.9 kg)   SpO2 99%   BMI 32.52 kg/m      10/03/2024   10:15 AM 09/13/2024    8:00 AM 08/15/2024   10:00 AM  BP/Weight  Systolic BP 130 127 136  Diastolic BP 83 80 89  Wt. (Lbs) 220.2 215 214  BMI 32.52 kg/m2 31.75 kg/m2 31.6 kg/m2      Physical Exam Constitutional:      Appearance: He is well-developed.  Cardiovascular:     Rate and Rhythm: Normal rate.     Heart sounds: Normal heart sounds. No murmur heard. Pulmonary:     Effort: Pulmonary effort is normal.     Breath sounds: Normal breath sounds. No wheezing or rales.  Chest:     Chest wall: No tenderness.  Abdominal:     General: Bowel sounds are  normal. There is no distension.     Palpations: Abdomen is soft. There is no mass.     Tenderness: There is no abdominal tenderness.  Musculoskeletal:        General: Normal range of motion.     Right lower leg: No edema.     Left lower leg: No edema.     Comments: Normal appearance of R toe with no TTP, no erythema or warmth  Neurological:     Mental Status: He is alert and oriented to person, place, and time.  Psychiatric:  Mood and Affect: Mood normal.        Latest Ref Rng & Units 08/01/2024    8:48 AM 09/28/2023    8:30 AM 08/04/2023   10:14 AM  CMP  Glucose 65 - 99 mg/dL 898  887  896   BUN 7 - 25 mg/dL 16  12  10    Creatinine 0.60 - 1.29 mg/dL 8.81  8.86  8.76   Sodium 135 - 146 mmol/L 137  141  139   Potassium 3.5 - 5.3 mmol/L 4.1  4.0  4.5   Chloride 98 - 110 mmol/L 104  106  103   CO2 20 - 32 mmol/L 26  27  23    Calcium  8.6 - 10.3 mg/dL 9.1  9.4  9.2   Total Protein 6.1 - 8.1 g/dL 7.2  7.3  7.0   Total Bilirubin 0.2 - 1.2 mg/dL 0.6  0.7  0.5   Alkaline Phos 44 - 121 IU/L   90   AST 10 - 40 U/L 35  36  36   ALT 9 - 46 U/L 35  38  34     Lipid Panel     Component Value Date/Time   CHOL 134 08/01/2024 0848   CHOL 120 08/04/2023 1014   TRIG 55 08/01/2024 0848   HDL 57 08/01/2024 0848   HDL 51 08/04/2023 1014   CHOLHDL 2.4 08/01/2024 0848   VLDL 20 05/11/2017 1032   LDLCALC 63 08/01/2024 0848    CBC    Component Value Date/Time   WBC 4.3 08/01/2024 0848   RBC 4.73 08/01/2024 0848   HGB 15.7 08/01/2024 0848   HGB 15.7 08/04/2023 1014   HCT 46.7 08/01/2024 0848   HCT 47.0 08/04/2023 1014   PLT 247 08/01/2024 0848   PLT 239 08/04/2023 1014   MCV 98.7 08/01/2024 0848   MCV 97 08/04/2023 1014   MCH 33.2 (H) 08/01/2024 0848   MCHC 33.6 08/01/2024 0848   RDW 12.9 08/01/2024 0848   RDW 13.0 08/04/2023 1014   LYMPHSABS 1.3 08/04/2023 1014   MONOABS 336 05/11/2017 1032   EOSABS 60 08/01/2024 0848   EOSABS 0.0 08/04/2023 1014   BASOSABS 60 08/01/2024  0848   BASOSABS 0.1 08/04/2023 1014    Lab Results  Component Value Date   HGBA1C 5.6 08/04/2023       Assessment & Plan Right great toe pain Suspected gout with resolved right toe pain Intermittent swelling and tenderness suggestive of gout. Symptoms resolved with tart cherry juice. Family history noted. Discussed dietary triggers and uric acid level confirmation. - Ordered uric acid level test. - Advised dietary modifications to avoid red meat, wine, and seafood. - Will discuss management options if uric acid level is elevated.  Colorectal cancer screening Colonoscopy not completed due to missed communication. - Placed referral for colonoscopy.       No orders of the defined types were placed in this encounter.   Follow-up: Return for previously scheduled appointment.       Corrina Sabin, MD, FAAFP. [facility] [place], [place] [phone]   10/03/2024, 10:46 AM

## 2024-10-04 ENCOUNTER — Ambulatory Visit: Payer: Self-pay | Admitting: Family Medicine

## 2024-10-04 ENCOUNTER — Other Ambulatory Visit: Payer: Self-pay

## 2024-10-04 ENCOUNTER — Other Ambulatory Visit (HOSPITAL_COMMUNITY): Payer: Self-pay

## 2024-10-04 LAB — URIC ACID: Uric Acid: 5.1 mg/dL (ref 3.8–8.4)

## 2024-10-05 ENCOUNTER — Other Ambulatory Visit: Payer: Self-pay

## 2024-10-07 ENCOUNTER — Other Ambulatory Visit: Payer: Self-pay

## 2024-10-07 ENCOUNTER — Ambulatory Visit

## 2024-10-07 MED ORDER — COVID-19 MRNA VAC-TRIS(PFIZER) 30 MCG/0.3ML IM SUSY
0.3000 mL | PREFILLED_SYRINGE | Freq: Once | INTRAMUSCULAR | 0 refills | Status: AC
  Filled 2024-10-07: qty 0.3, 1d supply, fill #0

## 2024-10-11 ENCOUNTER — Other Ambulatory Visit (HOSPITAL_COMMUNITY): Payer: Self-pay

## 2024-10-11 ENCOUNTER — Other Ambulatory Visit: Payer: Self-pay

## 2024-10-11 ENCOUNTER — Ambulatory Visit (INDEPENDENT_AMBULATORY_CARE_PROVIDER_SITE_OTHER): Payer: Self-pay | Admitting: Internal Medicine

## 2024-10-11 ENCOUNTER — Encounter (INDEPENDENT_AMBULATORY_CARE_PROVIDER_SITE_OTHER): Payer: Self-pay | Admitting: Internal Medicine

## 2024-10-11 VITALS — BP 133/83 | HR 66 | Temp 97.6°F | Ht 69.0 in | Wt 215.0 lb

## 2024-10-11 DIAGNOSIS — Z6831 Body mass index (BMI) 31.0-31.9, adult: Secondary | ICD-10-CM | POA: Diagnosis not present

## 2024-10-11 DIAGNOSIS — R638 Other symptoms and signs concerning food and fluid intake: Secondary | ICD-10-CM | POA: Insufficient documentation

## 2024-10-11 DIAGNOSIS — R7303 Prediabetes: Secondary | ICD-10-CM | POA: Diagnosis not present

## 2024-10-11 DIAGNOSIS — E66811 Obesity, class 1: Secondary | ICD-10-CM | POA: Diagnosis not present

## 2024-10-11 MED ORDER — PHENTERMINE HCL 37.5 MG PO TABS
37.5000 mg | ORAL_TABLET | Freq: Every day | ORAL | 0 refills | Status: DC
  Filled 2024-10-11 – 2024-11-01 (×3): qty 30, 30d supply, fill #0

## 2024-10-11 MED ORDER — METFORMIN HCL ER 500 MG PO TB24
500.0000 mg | ORAL_TABLET | Freq: Every day | ORAL | 0 refills | Status: DC
  Filled 2024-10-11: qty 30, 30d supply, fill #0
  Filled 2024-11-05: qty 30, 30d supply, fill #1

## 2024-10-11 NOTE — Assessment & Plan Note (Signed)
 After discussion of benefits and side effects will be started on metformin XR 500 mg 1 tablet daily.  We discussed how antiretroviral therapy may affect glucose metabolism.

## 2024-10-11 NOTE — Assessment & Plan Note (Signed)
 Weight: decrease of 27 lb (11.2%) over 10 months  Start: 12/09/2023 242 lb (109.8 kg)  End: 10/11/2024 215 lb (97.5 kg)  Patient has had a weight loss plateau which requires some troubleshooting and this could be waning effect of antiobesity medication, nutritional plateau, metabolic slowdown or training plateau.  We discussed strategies to troubleshoot this different areas including tracking and journaling and assessing proceed respiratory exertion while exercising. After initial weight loss of 28 pounds patient has hit a plateau on phentermine  and bupropion , he still benefiting from it appetite suppressing effect but the effects of phentermine  are waning off so we discussed about planning to come off medication in the near future.  This will require a tapering schedule and also use of other antiobesity medication.  We discussed the availability of generic GLP-1 but at present time is cost prohibitive.  He is interested in trying metformin and will be started on metformin XR 500 mg 1 tablet daily.  Another consideration is adding a low-dose naltrexone to the bupropion  which has helped with emotional hunger.

## 2024-10-11 NOTE — Assessment & Plan Note (Signed)
 He has brain hunger and emotional hunger this is the reason why he responded favorably to phentermine  and also bupropion .  He may be developing tolerance to the phentermine  and we discussed tapering medication off in the near future.  Medication has been helping maintain his weight loss but is losing its efficacy.

## 2024-10-11 NOTE — Progress Notes (Signed)
 Office: [phone]  /  Fax: [phone]  Weight Summary and Body Composition Analysis (BIA)  Vitals Temp: 97.6 F (36.4 C) BP: 133/83 Pulse Rate: 66 SpO2: 100 %   Anthropometric Measurements Height: 5' 9 (1.753 m) Weight: 215 lb (97.5 kg) BMI (Calculated): 31.74 Weight at Last Visit: 215 ln Weight Lost Since Last Visit: 0 lb Weight Gained Since Last Visit: 0 lb Starting Weight: 242 lb Total Weight Loss (lbs): 27 lb (12.2 kg) Peak Weight: 280 lb   Body Composition  Body Fat %: 26.2 % Fat Mass (lbs): 56.4 lbs Muscle Mass (lbs): 151 lbs Total Body Water (lbs): 110.8 lbs Visceral Fat Rating : 13    RMR: 2002  Today's Visit #: 13  Starting Date: 04/01/24   Subjective   Chief Complaint: Obesity  Interval History Discussed the use of AI scribe software for clinical note transcription with the patient, who gave verbal consent to proceed.  History of Present Illness Walter [name] is a 48 year old male with obesity, high blood pressure, and prediabetes who presents for medical weight management.  Since last office visit he has maintained.  He has hit a plateau since July.  He has been on phentermine  since January to aid in weight loss and has lost 27 pounds and is also taking bupropion  100 mg daily for emotional hunger. Phentermine  has helped him maintain his weight, but he feels he has reached a plateau. He notes that his cravings have decreased significantly, which he attributes to the bupropion .   He has a history of HIV and is currently on Biktarvy  for management.  In terms of lifestyle, he has increased his protein intake. His exercise routine includes morning stretches, yoga or meditation, and a four-mile walk, run, or jog depending on the weather. No shortness of breath during exercise is noted. He mentions feeling a decrease in energy levels, which he attributes to his demanding job and no longer using energy drinks.     Challenges affecting patient  progress: inadequate response to pharmacotherapy and metabolic adaptations associated with weight loss.    Pharmacotherapy for weight management: He is currently taking Bupropion  (single agent, off label use) with waning clinical response and without side effects. and Phentermine  (longterm use, off-label, single agent)  with adequate clinical response  and without side effects..   Assessment and Plan   Treatment Plan For Obesity:  Recommended Dietary Goals  [name] is currently in the action stage of change. As such, his goal is to continue weight management plan. He has agreed to: continue current plan  Behavioral Health and Counseling  We discussed the following behavioral modification strategies today: work on tracking and journaling calories using tracking application, continue to work on maintaining a reduced calorie state, getting the recommended amount of protein, incorporating whole foods, making healthy choices, staying well hydrated and practicing mindfulness when eating., and increase protein intake, fibrous foods (25 grams per day for women, 30 grams for men) and water to improve satiety and decrease hunger signals. .  Additional education and resources provided today: Handout on traveling and holiday eating strategies  Recommended Physical Activity Goals  [name] has been advised to work up to 150 minutes of moderate intensity aerobic activity a week and strengthening exercises 2-3 times per week for cardiovascular health, weight loss maintenance and preservation of muscle mass.  He has agreed to :  Discussed changing exercise routine to avoid adaptation plateau or training plateau, Increase and monitor steps for a goal of 10,000  per day, Increase volume of physical activity to a goal of 240 minutes a week, and Combine aerobic and strengthening exercises for efficiency and improved cardiometabolic health.  Medical Interventions and Pharmacotherapy  We discussed various  medication options to help [name] with his weight loss efforts and we both agreed to : After initial weight loss of 28 pounds patient has hit a plateau on phentermine  and bupropion , he still benefiting from it appetite suppressing effect but the effects of phentermine  are waning off so we discussed about planning to come off medication in the near future.  This will require a tapering schedule and also use of other antiobesity medication.  We discussed the availability of generic GLP-1 but at present time is cost prohibitive.  He is interested in trying metformin and will be started on metformin XR 500 mg 1 tablet daily.  Another consideration is adding a low-dose naltrexone to the bupropion  which has helped with emotional hunger.   Associated Conditions Impacted by Obesity Treatment  Assessment & Plan Prediabetes After discussion of benefits and side effects will be started on metformin XR 500 mg 1 tablet daily.  We discussed how antiretroviral therapy may affect glucose metabolism. Class 1 obesity with serious comorbidity and body mass index (BMI) of 31.0 to 31.9 in adult, unspecified obesity type Weight: decrease of 27 lb (11.2%) over 10 months  Start: 12/09/2023 242 lb (109.8 kg)  End: 10/11/2024 215 lb (97.5 kg)  Patient has had a weight loss plateau which requires some troubleshooting and this could be waning effect of antiobesity medication, nutritional plateau, metabolic slowdown or training plateau.  We discussed strategies to troubleshoot this different areas including tracking and journaling and assessing proceed respiratory exertion while exercising. After initial weight loss of 28 pounds patient has hit a plateau on phentermine  and bupropion , he still benefiting from it appetite suppressing effect but the effects of phentermine  are waning off so we discussed about planning to come off medication in the near future.  This will require a tapering schedule and also use of other antiobesity  medication.  We discussed the availability of generic GLP-1 but at present time is cost prohibitive.  He is interested in trying metformin and will be started on metformin XR 500 mg 1 tablet daily.  Another consideration is adding a low-dose naltrexone to the bupropion  which has helped with emotional hunger.  Abnormal food appetite He has brain hunger and emotional hunger this is the reason why he responded favorably to phentermine  and also bupropion .  He may be developing tolerance to the phentermine  and we discussed tapering medication off in the near future.  Medication has been helping maintain his weight loss but is losing its efficacy.          Objective   Physical Exam:  Blood pressure 133/83, pulse 66, temperature 97.6 F (36.4 C), height 5' 9 (1.753 m), weight 215 lb (97.5 kg), SpO2 100%. Body mass index is 31.75 kg/m.  General: He is overweight, cooperative, alert, well developed, and in no acute distress. PSYCH: Has normal mood, affect and thought process.   HEENT: EOMI, sclerae are anicteric. Lungs: Normal breathing effort, no conversational dyspnea. Extremities: No edema.  Neurologic: No gross sensory or motor deficits. No tremors or fasciculations noted.    Diagnostic Data Reviewed:  BMET    Component Value Date/Time   NA 137 08/01/2024 0848   NA 139 08/04/2023 1014   K 4.1 08/01/2024 0848   CL 104 08/01/2024 0848   CO2 26  08/01/2024 0848   GLUCOSE 101 (H) 08/01/2024 0848   BUN 16 08/01/2024 0848   BUN 10 08/04/2023 1014   CREATININE 1.18 08/01/2024 0848   CALCIUM  9.1 08/01/2024 0848   GFRNONAA 73 05/29/2021 0930   GFRAA 85 05/29/2021 0930   Lab Results  Component Value Date   HGBA1C 5.6 08/04/2023   HGBA1C 5.8 (H) 11/30/2017   Lab Results  Component Value Date   INSULIN  8.3 12/09/2023   Lab Results  Component Value Date   TSH 1.470 12/09/2023   CBC    Component Value Date/Time   WBC 4.3 08/01/2024 0848   RBC 4.73 08/01/2024 0848   HGB 15.7  08/01/2024 0848   HGB 15.7 08/04/2023 1014   HCT 46.7 08/01/2024 0848   HCT 47.0 08/04/2023 1014   PLT 247 08/01/2024 0848   PLT 239 08/04/2023 1014   MCV 98.7 08/01/2024 0848   MCV 97 08/04/2023 1014   MCH 33.2 (H) 08/01/2024 0848   MCHC 33.6 08/01/2024 0848   RDW 12.9 08/01/2024 0848   RDW 13.0 08/04/2023 1014   Iron Studies No results found for: IRON, TIBC, FERRITIN, IRONPCTSAT Lipid Panel     Component Value Date/Time   CHOL 134 08/01/2024 0848   CHOL 120 08/04/2023 1014   TRIG 55 08/01/2024 0848   HDL 57 08/01/2024 0848   HDL 51 08/04/2023 1014   CHOLHDL 2.4 08/01/2024 0848   VLDL 20 05/11/2017 1032   LDLCALC 63 08/01/2024 0848   Hepatic Function Panel     Component Value Date/Time   PROT 7.2 08/01/2024 0848   PROT 7.0 08/04/2023 1014   ALBUMIN 4.4 08/04/2023 1014   AST 35 08/01/2024 0848   ALT 35 08/01/2024 0848   ALKPHOS 90 08/04/2023 1014   BILITOT 0.6 08/01/2024 0848   BILITOT 0.5 08/04/2023 1014      Component Value Date/Time   TSH 1.470 12/09/2023 0910   Nutritional Lab Results  Component Value Date   VD25OH 31.5 12/09/2023    Medications: Outpatient Encounter Medications as of 10/11/2024  Medication Sig   atorvastatin  (LIPITOR) 40 MG tablet Take 1 tablet (40 mg total) by mouth daily.   bictegravir-emtricitabine -tenofovir  AF (BIKTARVY ) 50-200-25 MG TABS tablet Take 1 tablet by mouth daily.   buPROPion  ER (WELLBUTRIN  SR) 100 MG 12 hr tablet Take 1 tablet (100 mg total) by mouth daily.   doxycycline (VIBRA-TABS) 100 MG tablet Take two tablets by mouth after sex to prevent STI   levocetirizine (XYZAL  ALLERGY 24HR) 5 MG tablet Take 1 tablet (5 mg total) by mouth every evening.   phentermine  (ADIPEX-P ) 37.5 MG tablet Take 1 tablet (37.5 mg total) by mouth daily before breakfast.   [DISCONTINUED] fluticasone  (FLONASE ) 50 MCG/ACT nasal spray Place 2 sprays into both nostrils daily.   No facility-administered encounter medications on file as of  10/11/2024.     Follow-Up   No follow-ups on file.SABRA He was informed of the importance of frequent follow up visits to maximize his success with intensive lifestyle modifications for his multiple health conditions.  Attestation Statement   Reviewed by clinician on day of visit: allergies, medications, problem list, medical history, surgical history, family history, social history, and previous encounter notes.     Lucas Parker, MD

## 2024-10-13 NOTE — Progress Notes (Signed)
 Virtual Visit via Telephone Note  I connected with Walter [name] on 10/13/24 at  9:00 AM EST by telephone and verified that I am speaking with the correct person using two identifiers.  Location: Patient: Home Provider: [facility]   I discussed the limitations, risks, security and privacy concerns of performing an evaluation and management service by telephone and the availability of in person appointments. I also discussed with the patient that there may be a patient responsible charge related to this service. The patient expressed understanding and agreed to proceed.   History of Present Illness:  Discussed the use of AI scribe software for clinical note transcription with the patient, who gave verbal consent to proceed.  History of Present Illness   Walter [name] is a 48 year old male with HIV who presents for a routine follow-up visit.  He is currently on Biktarvy  for HIV management, with recent lab results showing an undetectable viral load and a CD4 count of 480. He has received both flu and COVID vaccinations.  He has been attending [facility] and has lost approximately 28 pounds over the past few months. His current weight is 214 pounds, and he is 5 feet 9 and a half inches tall. He has reached a plateau, maintaining a weight between 214 and 216 pounds for about three months. He is being weaned off phentermine  and has started taking metformin . Insurance issues have prevented the use of injectable weight loss medications like Wegovy.  He reports that his primary care doctor recommended colon cancer screening due to his age.  Socially, he enjoys hosting gatherings and plans to host a immunologist of about seven people for the upcoming Thanksgiving holiday, compared to larger gatherings of around thirty people in the past.       Past Medical History:  Diagnosis Date   Acute esophagitis 10/08/2015   AIDS (HCC) 10/08/2015   COVID-19 virus infection 12/19/2019    Essential hypertension    Hyperglycemia 11/30/2017   Hyperlipidemia 12/14/2022   Prediabetes 02/2019   Screen for STD (sexually transmitted disease) 01/23/2016   Seasonal allergies    Vaccine counseling 10/08/2023   Weight gain 12/14/2018    No past surgical history on file.  Family History  Problem Relation Age of Onset   Hypertension Mother    High Cholesterol Mother    Thyroid  disease Mother    Cancer Mother    Depression Mother    Anxiety disorder Mother    Alcoholism Mother    Obesity Mother    Diabetes Father    High blood pressure Father    Obesity Father       Social History   Socioeconomic History   Marital status: Single    Spouse name: Not on file   Number of children: Not on file   Years of education: Not on file   Highest education level: Associate degree: occupational, scientist, product/process development, or vocational program  Occupational History   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  Tobacco Use   Smoking status: Never   Smokeless tobacco: Never  Vaping Use   Vaping status: Never Used  Substance and Sexual Activity   Alcohol use: Yes    Comment: occ   Drug use: No   Sexual activity: Not Currently    Partners: Male    Comment: declined condoms  Other Topics Concern   Not on file  Social History Narrative   Not on file   Social Drivers of Health  Financial Resource Strain: Medium Risk (09/29/2024)   Overall Financial Resource Strain (CARDIA)    Difficulty of Paying Living Expenses: Somewhat hard  Food Insecurity: Food Insecurity Present (09/29/2024)   Hunger Vital Sign    Worried About Running Out of Food in the Last Year: Sometimes true    Ran Out of Food in the Last Year: Sometimes true  Transportation Needs: No Transportation Needs (09/29/2024)   PRAPARE - Administrator, Civil Service (Medical): No    Lack of Transportation (Non-Medical): No  Physical Activity: Sufficiently Active (09/29/2024)   Exercise Vital Sign     Days of Exercise per Week: 5 days    Minutes of Exercise per Session: 60 min  Stress: No Stress Concern Present (09/29/2024)   [employer] of Occupational Health - Occupational Stress Questionnaire    Feeling of Stress: Not at all  Social Connections: Unknown (09/29/2024)   Social Connection and Isolation Panel    Frequency of Communication with Friends and Family: More than three times a week    Frequency of Social Gatherings with Friends and Family: Three times a week    Attends Religious Services: More than 4 times per year    Active Member of Clubs or Organizations: Yes    Attends Banker Meetings: More than 4 times per year    Marital Status: Patient declined    No Known Allergies   Current Outpatient Medications:    atorvastatin  (LIPITOR) 40 MG tablet, Take 1 tablet (40 mg total) by mouth daily., Disp: 30 tablet, Rfl: 11   bictegravir-emtricitabine -tenofovir  AF (BIKTARVY ) 50-200-25 MG TABS tablet, Take 1 tablet by mouth daily., Disp: 30 tablet, Rfl: 11   buPROPion  ER (WELLBUTRIN  SR) 100 MG 12 hr tablet, Take 1 tablet (100 mg total) by mouth daily., Disp: 90 tablet, Rfl: 0   doxycycline  (VIBRA -TABS) 100 MG tablet, Take two tablets by mouth after sex to prevent STI, Disp: 60 tablet, Rfl: 5   levocetirizine (XYZAL  ALLERGY 24HR) 5 MG tablet, Take 1 tablet (5 mg total) by mouth every evening., Disp: 90 tablet, Rfl: 1   metFORMIN  (GLUCOPHAGE -XR) 500 MG 24 hr tablet, Take 1 tablet (500 mg total) by mouth daily with breakfast., Disp: 90 tablet, Rfl: 0   phentermine  (ADIPEX-P ) 37.5 MG tablet, Take 1 tablet (37.5 mg total) by mouth daily before breakfast., Disp: 30 tablet, Rfl: 0    Observations/Objective:  Dearius was comfortable and in NAD and has lost weight visibly in his face that I can see  Assessment and Plan:  Assessment and Plan    Human immunodeficiency virus (HIV) disease HIV well-controlled with undetectable viral load and CD4 count of 480. No STIs. Labs  normal. Bixarvy effective and well-tolerated. - Continue Bixarvy. - Follow-up in 10 months with labs prior. - Order anal Pap smear for next visit.  Obesity, class 1 28-pound weight loss achieved. Current weight 214 pounds, plateaued. Transitioning from phentermine  to metformin  due to insurance limitations on injectable medications. - Continue weight management program. - Transitioned to metformin . - Monitor weight and adjust treatment per weight loss management team   Hyperlipidemia: continue lipitor   Rectal cancer screening: anal pap next September, most recent one was normal        Follow Up Instructions:    I discussed the assessment and treatment plan with the patient. The patient was provided an opportunity to ask questions and all were answered. The patient agreed with the plan and demonstrated an understanding of the instructions.  The patient was advised to call back or seek an in-person evaluation if the symptoms worsen or if the condition fails to improve as anticipated.  I provided 26 minutes of non-face-to-face time during this encounter.   Jomarie Fleeta Rothman, MD

## 2024-10-17 ENCOUNTER — Telehealth: Admitting: Physician Assistant

## 2024-10-17 ENCOUNTER — Other Ambulatory Visit (HOSPITAL_COMMUNITY): Payer: Self-pay

## 2024-10-17 ENCOUNTER — Other Ambulatory Visit: Payer: Self-pay

## 2024-10-17 ENCOUNTER — Telehealth: Admitting: Infectious Disease

## 2024-10-17 DIAGNOSIS — B20 Human immunodeficiency virus [HIV] disease: Secondary | ICD-10-CM | POA: Diagnosis not present

## 2024-10-17 DIAGNOSIS — J069 Acute upper respiratory infection, unspecified: Secondary | ICD-10-CM | POA: Diagnosis not present

## 2024-10-17 DIAGNOSIS — R7303 Prediabetes: Secondary | ICD-10-CM

## 2024-10-17 DIAGNOSIS — I10 Essential (primary) hypertension: Secondary | ICD-10-CM

## 2024-10-17 DIAGNOSIS — E785 Hyperlipidemia, unspecified: Secondary | ICD-10-CM | POA: Diagnosis not present

## 2024-10-17 DIAGNOSIS — E66811 Obesity, class 1: Secondary | ICD-10-CM

## 2024-10-17 DIAGNOSIS — Z6831 Body mass index (BMI) 31.0-31.9, adult: Secondary | ICD-10-CM

## 2024-10-17 DIAGNOSIS — E7800 Pure hypercholesterolemia, unspecified: Secondary | ICD-10-CM

## 2024-10-17 DIAGNOSIS — Z7185 Encounter for immunization safety counseling: Secondary | ICD-10-CM

## 2024-10-17 DIAGNOSIS — Z1212 Encounter for screening for malignant neoplasm of rectum: Secondary | ICD-10-CM

## 2024-10-17 MED ORDER — LIDOCAINE VISCOUS HCL 2 % MT SOLN
OROMUCOSAL | 0 refills | Status: DC
  Filled 2024-10-17: qty 20, 2d supply, fill #0

## 2024-10-17 MED ORDER — ATORVASTATIN CALCIUM 40 MG PO TABS
40.0000 mg | ORAL_TABLET | Freq: Every day | ORAL | 11 refills | Status: AC
  Filled 2024-10-17 – 2024-10-29 (×2): qty 30, 30d supply, fill #0
  Filled 2024-11-29: qty 30, 30d supply, fill #1
  Filled ????-??-??: fill #2

## 2024-10-17 MED ORDER — LEVOCETIRIZINE DIHYDROCHLORIDE 5 MG PO TABS
5.0000 mg | ORAL_TABLET | Freq: Every evening | ORAL | 0 refills | Status: DC
  Filled 2024-10-17: qty 14, 14d supply, fill #0

## 2024-10-17 MED ORDER — FLUTICASONE PROPIONATE 50 MCG/ACT NA SUSP
2.0000 | Freq: Every day | NASAL | 6 refills | Status: DC
  Filled 2024-10-17: qty 16, 30d supply, fill #0

## 2024-10-17 MED ORDER — DOXYCYCLINE HYCLATE 100 MG PO TABS
ORAL_TABLET | ORAL | 5 refills | Status: AC
  Filled 2024-10-17 – 2024-10-25 (×2): qty 60, 30d supply, fill #0
  Filled 2024-11-24: qty 60, 30d supply, fill #1
  Filled 2024-12-26: qty 60, 30d supply, fill #2

## 2024-10-17 MED ORDER — PROMETHAZINE-DM 6.25-15 MG/5ML PO SYRP
5.0000 mL | ORAL_SOLUTION | Freq: Four times a day (QID) | ORAL | 0 refills | Status: DC | PRN
  Filled 2024-10-17: qty 118, 6d supply, fill #0

## 2024-10-17 MED ORDER — BIKTARVY 50-200-25 MG PO TABS
1.0000 | ORAL_TABLET | Freq: Every day | ORAL | 11 refills | Status: AC
  Filled 2024-10-17 – 2024-10-19 (×2): qty 30, 30d supply, fill #0
  Filled 2024-11-23 (×4): qty 30, 30d supply, fill #1
  Filled 2024-12-15: qty 30, 30d supply, fill #2

## 2024-10-17 NOTE — Progress Notes (Signed)

## 2024-10-19 ENCOUNTER — Other Ambulatory Visit: Payer: Self-pay

## 2024-10-19 ENCOUNTER — Other Ambulatory Visit (HOSPITAL_COMMUNITY): Payer: Self-pay

## 2024-10-25 ENCOUNTER — Other Ambulatory Visit: Payer: Self-pay

## 2024-10-25 ENCOUNTER — Other Ambulatory Visit: Payer: Self-pay | Admitting: Pharmacy Technician

## 2024-10-25 ENCOUNTER — Other Ambulatory Visit (HOSPITAL_COMMUNITY): Payer: Self-pay

## 2024-10-25 NOTE — Progress Notes (Signed)
 Specialty Pharmacy Refill Coordination Note  Walter [name] is a 48 y.o. male contacted today regarding refills of specialty medication(s)  Bictegravir-Emtricitab-Tenofov (Biktarvy )   Patient requested (Patient-Rptd) Delivery   Delivery date: 10/28/2024 Verified address: (Patient-Rptd) [address] [place] [address]   Medication will be filled on: 10/27/2024

## 2024-10-27 ENCOUNTER — Other Ambulatory Visit: Payer: Self-pay

## 2024-10-29 ENCOUNTER — Other Ambulatory Visit (HOSPITAL_COMMUNITY): Payer: Self-pay

## 2024-10-30 ENCOUNTER — Other Ambulatory Visit (HOSPITAL_COMMUNITY): Payer: Self-pay

## 2024-11-01 ENCOUNTER — Other Ambulatory Visit (HOSPITAL_COMMUNITY): Payer: Self-pay

## 2024-11-08 ENCOUNTER — Other Ambulatory Visit (HOSPITAL_COMMUNITY): Payer: Self-pay

## 2024-11-08 ENCOUNTER — Ambulatory Visit (INDEPENDENT_AMBULATORY_CARE_PROVIDER_SITE_OTHER): Payer: Self-pay | Admitting: Internal Medicine

## 2024-11-08 ENCOUNTER — Encounter (INDEPENDENT_AMBULATORY_CARE_PROVIDER_SITE_OTHER): Payer: Self-pay | Admitting: Internal Medicine

## 2024-11-08 DIAGNOSIS — E66811 Obesity, class 1: Secondary | ICD-10-CM

## 2024-11-08 DIAGNOSIS — F5089 Other specified eating disorder: Secondary | ICD-10-CM

## 2024-11-08 DIAGNOSIS — R7303 Prediabetes: Secondary | ICD-10-CM

## 2024-11-08 MED ORDER — METFORMIN HCL ER 500 MG PO TB24
500.0000 mg | ORAL_TABLET | Freq: Every day | ORAL | 0 refills | Status: AC
  Filled 2024-12-05: qty 90, 90d supply, fill #0

## 2024-11-08 MED ORDER — BUPROPION HCL ER (SR) 100 MG PO TB12
100.0000 mg | ORAL_TABLET | Freq: Every day | ORAL | 0 refills | Status: AC
  Filled 2024-11-08 – 2024-11-15 (×2): qty 30, 30d supply, fill #0
  Filled 2024-12-14: qty 30, 30d supply, fill #1

## 2024-11-08 MED ORDER — PHENTERMINE HCL 37.5 MG PO TABS
37.5000 mg | ORAL_TABLET | Freq: Every day | ORAL | 0 refills | Status: DC
  Filled 2024-11-08: qty 30, 30d supply, fill #0

## 2024-11-08 NOTE — Assessment & Plan Note (Signed)
 Improved on phentermine  and Wellbutrin  continue current regimen

## 2024-11-08 NOTE — Assessment & Plan Note (Signed)
 Weight: decrease of 28 lb (11.7%) over 10 months, 2 weeks  Start: 12/24/2023 240 lb (108.9 kg)  End: 11/08/2024 212 lb (96.2 kg)   Recent weight loss of 3 pounds over the holidays. Current weight is 214 pounds, down from a peak of 280 pounds. Weight loss has slowed, but recent downward trend noted. Current regimen includes phentermine , metformin , and bupropion , effectively managing appetite and cravings. No significant side effects reported. Discussed potential addition of GLP-1 agonists like Wegovy or Zepbound, but current regimen is effective and cost-efficient. Emphasized importance of maintaining a caloric intake of 1500 calories per day to achieve weight loss goals. Exercise routine includes cardio, stretching, and yoga, with emphasis on maintaining heart rate during workouts. Discussed challenges of losing the last 10 pounds due to metabolic adaptations. - Continue phentermine , metformin , and bupropion  regimen. - Maintain caloric intake of 1500 calories per day. - Continue exercise routine with emphasis on maintaining heart rate during workouts. - Sent refill for phentermine  and metformin . - Provided handout on holiday weight management tips.

## 2024-11-08 NOTE — Progress Notes (Signed)
 Walter Parker, MD ABIM, ABOM  [address] [place], [place], [place] [address] Office: [phone]  /  Fax: [phone]    Weight Summary and Body Composition Analysis (BIA)  Vitals Temp: 98.8 F (37.1 C) BP: 127/85 Pulse Rate: 68 SpO2: 100 %   Anthropometric Measurements Height: 5' 9 (1.753 m) Weight: 212 lb (96.2 kg) BMI (Calculated): 31.29 Weight at Last Visit: 212 lb Weight Lost Since Last Visit: 3 lb Weight Gained Since Last Visit: 0 lb Starting Weight: 242 lb Total Weight Loss (lbs): 30 lb (13.6 kg) Peak Weight: 280 lb   Body Composition  Body Fat %: 25.9 % Fat Mass (lbs): 55 lbs Muscle Mass (lbs): 149.2 lbs Total Body Water (lbs): 104.4 lbs Visceral Fat Rating : 12    RMR: 2002  Today's Visit #: 14  Starting Date: 04/01/24   Subjective   Chief Complaint: Obesity  Interval History  Walter [name] is here to discuss his progress with his obesity treatment plan. He is following the Category 3 plan - 1500 kcal per day and states he is following his eating plan approximately 80-90% of the time. He states he is exercising 45 minutes 5 times per week..  [] Denies [] Reports [x]  Improved problems with appetite and hunger signals.  [] Denies [] Reports [x]  Improved problems with satiety and satiation.  [] Denies [] Reports [x]  Improved problems with eating patterns and portion control.  [] Denies [] Reports [x]  Improved abnormal cravings  Discussed the use of AI scribe software for clinical note transcription with the patient, who gave verbal consent to proceed.  History of Present Illness Walter [name] is a 48 year old male who presents for medical weight management.  He has been actively managing his weight and has lost three pounds over the holidays. He attributes this to a combination of medications, including bupropion  and metformin , which he believes have helped curb his appetite and manage his weight. He is currently on a regimen of phentermine , bupropion ,  and metformin , and finds the combination of phentermine  and metformin  effective for continued weight loss. No anxiety or jitteriness as side effects from phentermine .  He has a history of significant weight loss, having reduced his weight from 280 pounds to his current weight of 215 pounds. He initially started at 242 pounds with the current medical team and has experienced fluctuations, including a plateau over the summer, but has resumed a downward trend recently. He has reduced his waist size from 44 inches to 39 inches.  He exercises five days a week for 45 minutes, incorporating cardio, stretching, and yoga. He manages his time to ensure he does not eat after 8 PM, bringing meals to work to avoid late eating. He has been mindful of his diet, focusing on protein and raw vegetables throughout the day.  He is on antiretroviral medications for HIV, which can affect weight and metabolism, but he has managed to lose weight despite these challenges. Metformin  causes him to go to the bathroom more frequently, but he has not experienced diarrhea. The effect of metformin  on his bowel movements depends on his carbohydrate intake.    Challenges affecting patient progress: presence of obesogenic drugs.    Pharmacotherapy for weight management: He is currently taking Metformin  with diabetes as primary indication with adequate clinical response  and without side effects., Bupropion  (single agent, off label use) with adequate clinical response  and without side effects., and Phentermine  (longterm use, off-label, single agent)  with adequate clinical response  and without side effects..   Assessment and  Plan   Treatment Plan For Obesity:  Recommended Dietary Goals  Walter [name] is currently in the action stage of change. As such, his goal is to continue weight management plan. He has agreed to: incorporate prepackaged healthy meals for convenience, incorporate 1-2 meal replacements a day for convenience , and  continue current plan  Behavioral Health and Counseling  We discussed the following behavioral modification strategies today: increasing lean protein intake to established goals, decreasing simple carbohydrates , increasing vegetables, increasing fiber rich foods, avoiding skipping meals, and increasing water intake .  Additional education and resources provided today: Handout on traveling and holiday eating strategies  Recommended Physical Activity Goals  Walter [name] has been advised to work up to 150 minutes of moderate intensity aerobic activity a week and strengthening exercises 2-3 times per week for cardiovascular health, weight loss maintenance and preservation of muscle mass.  He has agreed to :  Continue to gradually increase the amount and intensity of exercise routine, Increase volume of physical activity to a goal of 240 minutes a week, and Combine aerobic and strengthening exercises for efficiency and improved cardiometabolic health.  Medical Interventions and Pharmacotherapy  We have reviewed current or available treatment options to assist him with their weight loss efforts and we agreed to : Adequate clinical response to anti-obesity medication, continue current regimen and Patient was counseled on the importance of maintaining healthy lifestyle habits, including balanced nutrition, regular physical activity, and behavioral modifications, while taking antiobesity medication.  Patient verbalized understanding that medication is an adjunct to, not a replacement for, lifestyle changes and that the long-term success and weight maintenance depend on continued adherence to these strategies.  We have discussed that long-term use of phentermine  for weight management. Patient informed that use beyond 12 weeks is off-label. Reviewed evidence supporting extended use in select patients with regular monitoring. Risks discussed: insomnia, increased heart rate, elevated BP, and potential for  dependence. Alternatives include lifestyle interventions, FDA-approved medications (e.g., Wegovy, Zepbound, Qsymia, Contrave)- these are either not covered, cost prohibitive or contraindicated. Medical history reviewed for contraindications.  Patient verbalized understanding and agreed to continue phentermine  with close monitoring. Plan includes regular follow-up every 4-6 weeks to assess efficacy, side effects, and vitals.  Medication safety: Reviewed common side effects of phentermine , no side effects reported.  Reviewed vitals signs and they are stable Reviewed for contraindications, none present Reviewed state registry for controlled substances and no other controlled substances found Medication will be discontinued if less than 5% weight loss in 6 months Discussed safety data and off label use for long-term treatment of obesity.  Associated Conditions Impacted by Obesity Treatment  Assessment & Plan Prediabetes Currently on metformin  XR 500 mg 1 tablet daily.  We discussed how antiretroviral therapy may affect glucose metabolism. Class 1 obesity with serious comorbidity and body mass index (BMI) of 31.0 to 31.9 in adult, unspecified obesity type Weight: decrease of 28 lb (11.7%) over 10 months, 2 weeks  Start: 12/24/2023 240 lb (108.9 kg)  End: 11/08/2024 212 lb (96.2 kg)   Recent weight loss of 3 pounds over the holidays. Current weight is 214 pounds, down from a peak of 280 pounds. Weight loss has slowed, but recent downward trend noted. Current regimen includes phentermine , metformin , and bupropion , effectively managing appetite and cravings. No significant side effects reported. Discussed potential addition of GLP-1 agonists like Wegovy or Zepbound, but current regimen is effective and cost-efficient. Emphasized importance of maintaining a caloric intake of 1500 calories per day to achieve weight  loss goals. Exercise routine includes cardio, stretching, and yoga, with emphasis on  maintaining heart rate during workouts. Discussed challenges of losing the last 10 pounds due to metabolic adaptations. - Continue phentermine , metformin , and bupropion  regimen. - Maintain caloric intake of 1500 calories per day. - Continue exercise routine with emphasis on maintaining heart rate during workouts. - Sent refill for phentermine  and metformin . - Provided handout on holiday weight management tips. Other disorder of eating Improved on phentermine  and Wellbutrin  continue current regimen             No orders of the defined types were placed in this encounter.    Objective   Physical Exam:  Blood pressure 127/85, pulse 68, temperature 98.8 F (37.1 C), height 5' 9 (1.753 m), weight 212 lb (96.2 kg), SpO2 100%. Body mass index is 31.31 kg/m.  General: He is overweight, cooperative, alert, well developed, and in no acute distress. PSYCH: Has normal mood, affect and thought process.   HEENT: EOMI, sclerae are anicteric. Lungs: Normal breathing effort, no conversational dyspnea. Extremities: No edema.  Neurologic: No gross sensory or motor deficits. No tremors or fasciculations noted.    Diagnostic Data Reviewed:  BMET    Component Value Date/Time   NA 137 08/01/2024 0848   NA 139 08/04/2023 1014   K 4.1 08/01/2024 0848   CL 104 08/01/2024 0848   CO2 26 08/01/2024 0848   GLUCOSE 101 (H) 08/01/2024 0848   BUN 16 08/01/2024 0848   BUN 10 08/04/2023 1014   CREATININE 1.18 08/01/2024 0848   CALCIUM  9.1 08/01/2024 0848   GFRNONAA 73 05/29/2021 0930   GFRAA 85 05/29/2021 0930   Lab Results  Component Value Date   HGBA1C 5.6 08/04/2023   HGBA1C 5.8 (H) 11/30/2017   Lab Results  Component Value Date   INSULIN  8.3 12/09/2023   Lab Results  Component Value Date   TSH 1.470 12/09/2023   CBC    Component Value Date/Time   WBC 4.3 08/01/2024 0848   RBC 4.73 08/01/2024 0848   HGB 15.7 08/01/2024 0848   HGB 15.7 08/04/2023 1014   HCT 46.7 08/01/2024  0848   HCT 47.0 08/04/2023 1014   PLT 247 08/01/2024 0848   PLT 239 08/04/2023 1014   MCV 98.7 08/01/2024 0848   MCV 97 08/04/2023 1014   MCH 33.2 (H) 08/01/2024 0848   MCHC 33.6 08/01/2024 0848   RDW 12.9 08/01/2024 0848   RDW 13.0 08/04/2023 1014   Iron Studies No results found for: IRON, TIBC, FERRITIN, IRONPCTSAT Lipid Panel     Component Value Date/Time   CHOL 134 08/01/2024 0848   CHOL 120 08/04/2023 1014   TRIG 55 08/01/2024 0848   HDL 57 08/01/2024 0848   HDL 51 08/04/2023 1014   CHOLHDL 2.4 08/01/2024 0848   VLDL 20 05/11/2017 1032   LDLCALC 63 08/01/2024 0848   Hepatic Function Panel     Component Value Date/Time   PROT 7.2 08/01/2024 0848   PROT 7.0 08/04/2023 1014   ALBUMIN 4.4 08/04/2023 1014   AST 35 08/01/2024 0848   ALT 35 08/01/2024 0848   ALKPHOS 90 08/04/2023 1014   BILITOT 0.6 08/01/2024 0848   BILITOT 0.5 08/04/2023 1014      Component Value Date/Time   TSH 1.470 12/09/2023 0910   Nutritional Lab Results  Component Value Date   VD25OH 31.5 12/09/2023    Medications: Outpatient Encounter Medications as of 11/08/2024  Medication Sig   atorvastatin  (LIPITOR) 40 MG  tablet Take 1 tablet (40 mg total) by mouth daily.   bictegravir-emtricitabine -tenofovir  AF (BIKTARVY ) 50-200-25 MG TABS tablet Take 1 tablet by mouth daily.   doxycycline  (VIBRA -TABS) 100 MG tablet Take two tablets by mouth after sex to prevent STI   [DISCONTINUED] buPROPion  ER (WELLBUTRIN  SR) 100 MG 12 hr tablet Take 1 tablet (100 mg total) by mouth daily.   [DISCONTINUED] fluticasone  (FLONASE ) 50 MCG/ACT nasal spray Place 2 sprays into both nostrils daily.   [DISCONTINUED] levocetirizine (XYZAL  ALLERGY 24HR) 5 MG tablet Take 1 tablet (5 mg total) by mouth every evening for 14 days.   [DISCONTINUED] lidocaine  (XYLOCAINE ) 2 % solution Gargle 2 - 3 mls every 4 - 5 hours as needed for sore throat   [DISCONTINUED] metFORMIN  (GLUCOPHAGE -XR) 500 MG 24 hr tablet Take 1 tablet  (500 mg total) by mouth daily with breakfast.   [DISCONTINUED] phentermine  (ADIPEX-P ) 37.5 MG tablet Take 1 tablet (37.5 mg total) by mouth daily before breakfast.   [DISCONTINUED] promethazine -dextromethorphan (PROMETHAZINE -DM) 6.25-15 MG/5ML syrup Take 5 mLs by mouth 4 (four) times daily as needed for cough.   buPROPion  ER (WELLBUTRIN  SR) 100 MG 12 hr tablet Take 1 tablet (100 mg total) by mouth daily.   metFORMIN  (GLUCOPHAGE -XR) 500 MG 24 hr tablet Take 1 tablet (500 mg total) by mouth daily with breakfast.   phentermine  (ADIPEX-P ) 37.5 MG tablet Take 1 tablet (37.5 mg total) by mouth daily before breakfast.   No facility-administered encounter medications on file as of 11/08/2024.     Follow-Up   No follow-ups on file.SABRA He was informed of the importance of frequent follow up visits to maximize his success with intensive lifestyle modifications for his multiple health conditions.  Attestation Statement   Reviewed by clinician on day of visit: allergies, medications, problem list, medical history, surgical history, family history, social history, and previous encounter notes.     Walter Parker, MD ABIM, KENYON

## 2024-11-08 NOTE — Assessment & Plan Note (Signed)
 Currently on metformin  XR 500 mg 1 tablet daily.  We discussed how antiretroviral therapy may affect glucose metabolism.

## 2024-11-15 ENCOUNTER — Other Ambulatory Visit: Payer: Self-pay

## 2024-11-15 ENCOUNTER — Other Ambulatory Visit (HOSPITAL_COMMUNITY): Payer: Self-pay

## 2024-11-16 ENCOUNTER — Other Ambulatory Visit: Payer: Self-pay

## 2024-11-21 ENCOUNTER — Telehealth: Admitting: Infectious Disease

## 2024-11-21 ENCOUNTER — Other Ambulatory Visit: Payer: Self-pay

## 2024-11-21 ENCOUNTER — Other Ambulatory Visit (HOSPITAL_COMMUNITY): Payer: Self-pay

## 2024-11-21 DIAGNOSIS — B20 Human immunodeficiency virus [HIV] disease: Secondary | ICD-10-CM

## 2024-11-23 ENCOUNTER — Other Ambulatory Visit: Payer: Self-pay

## 2024-11-23 NOTE — Progress Notes (Signed)
 Specialty Pharmacy Refill Coordination Note  Walter [name] is a 48 y.o. male contacted today regarding refills of specialty medication(s) Bictegravir-Emtricitab-Tenofov (Biktarvy )   Patient requested Delivery   Delivery date: 11/25/24   Verified address: [address] [place] [address]   Medication will be filled on: 11/23/24

## 2024-11-24 ENCOUNTER — Other Ambulatory Visit (HOSPITAL_COMMUNITY): Payer: Self-pay

## 2024-11-25 NOTE — Progress Notes (Signed)
 This visit was cancelled.

## 2024-11-29 ENCOUNTER — Other Ambulatory Visit (HOSPITAL_COMMUNITY): Payer: Self-pay

## 2024-12-05 ENCOUNTER — Other Ambulatory Visit (HOSPITAL_COMMUNITY): Payer: Self-pay

## 2024-12-06 ENCOUNTER — Other Ambulatory Visit (HOSPITAL_COMMUNITY): Payer: Self-pay

## 2024-12-06 ENCOUNTER — Ambulatory Visit (INDEPENDENT_AMBULATORY_CARE_PROVIDER_SITE_OTHER): Payer: Self-pay | Admitting: Internal Medicine

## 2024-12-06 ENCOUNTER — Encounter (INDEPENDENT_AMBULATORY_CARE_PROVIDER_SITE_OTHER): Payer: Self-pay | Admitting: Internal Medicine

## 2024-12-06 ENCOUNTER — Encounter (INDEPENDENT_AMBULATORY_CARE_PROVIDER_SITE_OTHER): Payer: Self-pay

## 2024-12-06 VITALS — BP 130/88 | HR 80 | Temp 97.7°F | Ht 69.0 in | Wt 217.0 lb

## 2024-12-06 DIAGNOSIS — I10 Essential (primary) hypertension: Secondary | ICD-10-CM

## 2024-12-06 DIAGNOSIS — R7303 Prediabetes: Secondary | ICD-10-CM

## 2024-12-06 DIAGNOSIS — Z6835 Body mass index (BMI) 35.0-35.9, adult: Secondary | ICD-10-CM

## 2024-12-06 DIAGNOSIS — B20 Human immunodeficiency virus [HIV] disease: Secondary | ICD-10-CM

## 2024-12-06 DIAGNOSIS — E66812 Obesity, class 2: Secondary | ICD-10-CM

## 2024-12-06 DIAGNOSIS — R638 Other symptoms and signs concerning food and fluid intake: Secondary | ICD-10-CM

## 2024-12-06 MED ORDER — NALTREXONE HCL 50 MG PO TABS
25.0000 mg | ORAL_TABLET | Freq: Two times a day (BID) | ORAL | 0 refills | Status: AC
  Filled 2024-12-06: qty 15, 15d supply, fill #0

## 2024-12-06 NOTE — Assessment & Plan Note (Signed)
 Most recent A1c is  Lab Results  Component Value Date   HGBA1C 5.6 08/04/2023   HGBA1C 5.8 (H) 11/30/2017   Managed with metformin . Antiretroviral therapy for HIV may disrupt insulin  and glucose metabolism, necessitating continued use of metformin . Metformin  also aids in reducing visceral fat and altering gut microbiome. - Continue metformin  as prescribed. - Improved glucose metabolism

## 2024-12-06 NOTE — Assessment & Plan Note (Signed)
 Vitals:   12/06/24 0800 12/06/24 0825  BP: (!) 143/93 130/88  Blood pressure is not at goal for age and risk category.  Off blood pressure medications.  We are discontinuing phentermine  due to tolerance so anticipate improvement in blood pressure control.  If his blood pressure remains elevated and he still benefiting from bupropion  he may need to start an antihypertensive.

## 2024-12-06 NOTE — Assessment & Plan Note (Signed)
" °  Obesity management with a focus on maintaining current weight. He has reached a steady state with weight fluctuating between 212-217 lbs. Current regimen includes bupropion , metformin , and phentermine . Phentermine  has been discontinued due to tolerance. Emotional hunger and stress-related eating are contributing factors. Discussed potential addition of naltrexone  to address emotional hunger. Consideration of GLP-1 agonists as a future option, pending insurance coverage and cost considerations. - Discontinued phentermine  due to tolerance and elevations in blood pressure. - Continue bupropion  ER, one tablet daily. - Initiated naltrexone  at 12.5 mg once daily for 7 days, then increase to 12.5 mg twice daily. - Monitor calorie intake, aiming for 1800 calories daily for maintenance. - Encouraged continuation of current exercise regimen. - Discussed potential use of GLP-1 agonists, pending insurance coverage and cost considerations. "

## 2024-12-06 NOTE — Assessment & Plan Note (Signed)
 Reviewed recent labs ordered by infectious disease he has an undetectable viral load with a CD4 count of 480 which has improved.  He is currently HAART without any reported side effects .  Continue HAART

## 2024-12-06 NOTE — Progress Notes (Signed)
 "                                                  Office: [phone]  /  Fax: [phone]  Weight Summary and Body Composition Analysis (BIA)  Vitals Temp: 97.7 F (36.5 C) BP: 130/88 Pulse Rate: 80 SpO2: 100 %   Anthropometric Measurements Height: 5' 9 (1.753 m) Weight: 217 lb (98.4 kg) BMI (Calculated): 32.03 Weight at Last Visit: 212 lb Weight Lost Since Last Visit: 0 lb Weight Gained Since Last Visit: 5 lb Starting Weight: 242 lb Total Weight Loss (lbs): 25 lb (11.3 kg) Peak Weight: 280 lb   Body Composition  Body Fat %: 27.3 % Fat Mass (lbs): 59.4 lbs Muscle Mass (lbs): 150.2 lbs Total Body Water (lbs): 108.2 lbs Visceral Fat Rating : 13    RMR: 2002  Today's Visit #: 15  Starting Date: 04/01/24   Subjective   Chief Complaint: Obesity  Interval History  Discussed the use of AI scribe software for clinical note transcription with the patient, who gave verbal consent to proceed.  History of Present Illness Walter [name] is a 49 year old male with prediabetes and hypertension who presents for medical weight management.  He is experiencing abnormal food appetite and cravings. He is currently on an anti-obesity regimen that includes bupropion , metformin , and phentermine . He has been without phentermine  for a few days and has not noticed any difference in his energy levels or eating habits.  He is on antiretroviral therapy for HIV and follows a fifteen hundred calorie low carb meal plan about eighty percent of the time. He exercises five days a week, engaging in forty-five minutes of cardio and strengthening exercises. Despite these efforts, he has gained five pounds since his last office visit.  He has been experiencing significant stress due to a recent breakup after a thirteen-year relationship, which has affected his eating patterns. His calorie intake has been inconsistent, with some days of overeating and others of not eating at all.  He consumes  wine daily, typically two to three glasses, which has increased since his breakup. There is a family history of alcohol use disorder, but family members have since become ministers and theatre stage manager.     Challenges affecting patient progress: moderate to high levels of stress.    Pharmacotherapy for weight management: He is currently taking Metformin  (off label use for weight management and / or insulin  resistance and / or diabetes prevention) with adequate clinical response  and without side effects., Bupropion  (single agent, off label use) with adequate clinical response  and without side effects., and Phentermine  (longterm use, off-label, single agent)  with adequate clinical response  and without side effects..   Assessment and Plan   Treatment Plan For Obesity:  Recommended Dietary Goals  Walter [name] is currently in the action stage of change. As such, his goal is to continue weight management plan. He has agreed to: continue current plan  Behavioral Health and Counseling  We discussed the following behavioral modification strategies today: increasing lean protein intake to established goals, decreasing simple carbohydrates , increasing vegetables, increasing lower glycemic fruits, increasing fiber rich foods, avoiding skipping meals, increasing water intake , work on meal planning and preparation, planning for success, and better snacking choices.  Additional education and resources provided today: None  Recommended Physical Activity Goals  Walter [name]  has been advised to work up to 150 minutes of moderate intensity aerobic activity a week and strengthening exercises 2-3 times per week for cardiovascular health, weight loss maintenance and preservation of muscle mass.  He has agreed to :  Think about enjoyable ways to increase daily physical activity and overcoming barriers to exercise, Increase physical activity in their day and reduce sedentary time (increase NEAT)., Increase volume of  physical activity to a goal of 240 minutes a week, and Combine aerobic and strengthening exercises for efficiency and improved cardiometabolic health.  Medical Interventions and Pharmacotherapy  We discussed various medication options to help Walter [name] with his weight loss efforts and we both agreed to : See obesity treatment  Associated Conditions Impacted by Obesity Treatment  Assessment & Plan Abnormal food appetite Class 2 severe obesity with serious comorbidity and body mass index (BMI) of 35.0 to 35.9 in adult, unspecified obesity type  Obesity management with a focus on maintaining current weight. He has reached a steady state with weight fluctuating between 212-217 lbs. Current regimen includes bupropion , metformin , and phentermine . Phentermine  has been discontinued due to tolerance. Emotional hunger and stress-related eating are contributing factors. Discussed potential addition of naltrexone  to address emotional hunger. Consideration of GLP-1 agonists as a future option, pending insurance coverage and cost considerations. - Discontinued phentermine  due to tolerance and elevations in blood pressure. - Continue bupropion  ER, one tablet daily. - Initiated naltrexone  at 12.5 mg once daily for 7 days, then increase to 12.5 mg twice daily. - Monitor calorie intake, aiming for 1800 calories daily for maintenance. - Encouraged continuation of current exercise regimen. - Discussed potential use of GLP-1 agonists, pending insurance coverage and cost considerations. Primary hypertension Vitals:   12/06/24 0800 12/06/24 0825  BP: (!) 143/93 130/88  Blood pressure is not at goal for age and risk category.  Off blood pressure medications.  We are discontinuing phentermine  due to tolerance so anticipate improvement in blood pressure control.  If his blood pressure remains elevated and he still benefiting from bupropion  he may need to start an antihypertensive. HIV disease (HCC) Reviewed recent labs  ordered by infectious disease he has an undetectable viral load with a CD4 count of 480 which has improved.  He is currently HAART without any reported side effects .  Continue HAART   Prediabetes Most recent A1c is  Lab Results  Component Value Date   HGBA1C 5.6 08/04/2023   HGBA1C 5.8 (H) 11/30/2017   Managed with metformin . Antiretroviral therapy for HIV may disrupt insulin  and glucose metabolism, necessitating continued use of metformin . Metformin  also aids in reducing visceral fat and altering gut microbiome. - Continue metformin  as prescribed. - Improved glucose metabolism             Objective   Physical Exam:  Blood pressure 130/88, pulse 80, temperature 97.7 F (36.5 C), height 5' 9 (1.753 m), weight 217 lb (98.4 kg), SpO2 100%. Body mass index is 32.05 kg/m.  General: He is overweight, cooperative, alert, well developed, and in no acute distress. PSYCH: Has normal mood, affect and thought process.   HEENT: EOMI, sclerae are anicteric. Lungs: Normal breathing effort, no conversational dyspnea. Extremities: No edema.  Neurologic: No gross sensory or motor deficits. No tremors or fasciculations noted.    Diagnostic Data Reviewed:  BMET    Component Value Date/Time   NA 137 08/01/2024 0848   NA 139 08/04/2023 1014   K 4.1 08/01/2024 0848   CL 104 08/01/2024 0848   CO2  26 08/01/2024 0848   GLUCOSE 101 (H) 08/01/2024 0848   BUN 16 08/01/2024 0848   BUN 10 08/04/2023 1014   CREATININE 1.18 08/01/2024 0848   CALCIUM  9.1 08/01/2024 0848   GFRNONAA 73 05/29/2021 0930   GFRAA 85 05/29/2021 0930   Lab Results  Component Value Date   HGBA1C 5.6 08/04/2023   HGBA1C 5.8 (H) 11/30/2017   Lab Results  Component Value Date   INSULIN  8.3 12/09/2023   Lab Results  Component Value Date   TSH 1.470 12/09/2023   CBC    Component Value Date/Time   WBC 4.3 08/01/2024 0848   RBC 4.73 08/01/2024 0848   HGB 15.7 08/01/2024 0848   HGB 15.7 08/04/2023 1014    HCT 46.7 08/01/2024 0848   HCT 47.0 08/04/2023 1014   PLT 247 08/01/2024 0848   PLT 239 08/04/2023 1014   MCV 98.7 08/01/2024 0848   MCV 97 08/04/2023 1014   MCH 33.2 (H) 08/01/2024 0848   MCHC 33.6 08/01/2024 0848   RDW 12.9 08/01/2024 0848   RDW 13.0 08/04/2023 1014   Iron Studies No results found for: IRON, TIBC, FERRITIN, IRONPCTSAT Lipid Panel     Component Value Date/Time   CHOL 134 08/01/2024 0848   CHOL 120 08/04/2023 1014   TRIG 55 08/01/2024 0848   HDL 57 08/01/2024 0848   HDL 51 08/04/2023 1014   CHOLHDL 2.4 08/01/2024 0848   VLDL 20 05/11/2017 1032   LDLCALC 63 08/01/2024 0848   Hepatic Function Panel     Component Value Date/Time   PROT 7.2 08/01/2024 0848   PROT 7.0 08/04/2023 1014   ALBUMIN 4.4 08/04/2023 1014   AST 35 08/01/2024 0848   ALT 35 08/01/2024 0848   ALKPHOS 90 08/04/2023 1014   BILITOT 0.6 08/01/2024 0848   BILITOT 0.5 08/04/2023 1014      Component Value Date/Time   TSH 1.470 12/09/2023 0910   Nutritional Lab Results  Component Value Date   VD25OH 31.5 12/09/2023    Medications: Outpatient Encounter Medications as of 12/06/2024  Medication Sig Note   atorvastatin  (LIPITOR) 40 MG tablet Take 1 tablet (40 mg total) by mouth daily.    bictegravir-emtricitabine -tenofovir  AF (BIKTARVY ) 50-200-25 MG TABS tablet Take 1 tablet by mouth daily.    buPROPion  ER (WELLBUTRIN  SR) 100 MG 12 hr tablet Take 1 tablet (100 mg total) by mouth daily.    doxycycline  (VIBRA -TABS) 100 MG tablet Take two tablets by mouth after sex to prevent STI    metFORMIN  (GLUCOPHAGE -XR) 500 MG 24 hr tablet Take 1 tablet (500 mg total) by mouth daily with breakfast.    naltrexone  (DEPADE) 50 MG tablet Take 0.5 tablets (25 mg total) by mouth 2 (two) times daily.    [DISCONTINUED] phentermine  (ADIPEX-P ) 37.5 MG tablet Take 1 tablet (37.5 mg total) by mouth daily before breakfast. 12/06/2024: tolerance   No facility-administered encounter medications on file as of  12/06/2024.     Follow-Up   Return in about 6 weeks (around 01/17/2025) for For Weight Mangement with Dr. Francyne.SABRA He was informed of the importance of frequent follow up visits to maximize his success with intensive lifestyle modifications for his multiple health conditions.  Attestation Statement   Reviewed by clinician on day of visit: allergies, medications, problem list, medical history, surgical history, family history, social history, and previous encounter notes.     Lucas Francyne, MD  "

## 2024-12-08 ENCOUNTER — Telehealth (INDEPENDENT_AMBULATORY_CARE_PROVIDER_SITE_OTHER): Payer: Self-pay | Admitting: Internal Medicine

## 2024-12-08 NOTE — Telephone Encounter (Signed)
 Please advise

## 2024-12-08 NOTE — Telephone Encounter (Signed)
 Pt called and the new medication naltrexone  is causing the side effect of severe anxiety and he has discontinued taking it. Please advise pt.

## 2024-12-12 ENCOUNTER — Telehealth (INDEPENDENT_AMBULATORY_CARE_PROVIDER_SITE_OTHER): Payer: Self-pay | Admitting: Internal Medicine

## 2024-12-12 NOTE — Telephone Encounter (Signed)
 Spoke w/ pt, stopped medicine after 2nd day and feels better. Will follow up at next appt

## 2024-12-14 ENCOUNTER — Other Ambulatory Visit (HOSPITAL_COMMUNITY): Payer: Self-pay

## 2024-12-15 ENCOUNTER — Other Ambulatory Visit: Payer: Self-pay

## 2024-12-15 NOTE — Progress Notes (Signed)
 Specialty Pharmacy Refill Coordination Note  Walter [name] is a 49 y.o. male contacted today regarding refills of specialty medication(s) Bictegravir-Emtricitab-Tenofov (Biktarvy )   Patient requested (Patient-Rptd) Delivery   Delivery date: 12/16/24   Verified address: (Patient-Rptd) [address] [place] [address]   Medication will be filled on: 12/15/24

## 2024-12-23 ENCOUNTER — Encounter: Payer: Self-pay | Admitting: Gastroenterology

## 2024-12-23 ENCOUNTER — Encounter: Payer: Self-pay | Admitting: Pediatrics

## 2024-12-26 ENCOUNTER — Other Ambulatory Visit: Payer: Self-pay

## 2024-12-29 ENCOUNTER — Other Ambulatory Visit (HOSPITAL_COMMUNITY): Payer: Self-pay

## 2025-01-03 ENCOUNTER — Ambulatory Visit (INDEPENDENT_AMBULATORY_CARE_PROVIDER_SITE_OTHER): Admitting: Internal Medicine

## 2025-01-19 ENCOUNTER — Encounter: Admitting: Pediatrics

## 2025-02-14 ENCOUNTER — Ambulatory Visit (INDEPENDENT_AMBULATORY_CARE_PROVIDER_SITE_OTHER): Admitting: Internal Medicine

## 2025-02-27 ENCOUNTER — Encounter: Admitting: Family Medicine

## 2025-08-01 ENCOUNTER — Other Ambulatory Visit: Payer: Self-pay

## 2025-08-28 ENCOUNTER — Ambulatory Visit: Payer: Self-pay | Admitting: Infectious Disease
